# Patient Record
Sex: Male | Born: 1965 | Race: White | Hispanic: No | Smoking: Never smoker
Health system: Southern US, Community
[De-identification: ages and names within clinical notes are randomized; demographics above are authoritative.]

## PROBLEM LIST (undated history)

## (undated) DIAGNOSIS — I2510 Atherosclerotic heart disease of native coronary artery without angina pectoris: Secondary | ICD-10-CM

## (undated) DIAGNOSIS — G35 Multiple sclerosis: Secondary | ICD-10-CM

## (undated) DIAGNOSIS — F32A Depression, unspecified: Secondary | ICD-10-CM

## (undated) DIAGNOSIS — I10 Essential (primary) hypertension: Secondary | ICD-10-CM

## (undated) DIAGNOSIS — F329 Major depressive disorder, single episode, unspecified: Secondary | ICD-10-CM

## (undated) DIAGNOSIS — E785 Hyperlipidemia, unspecified: Secondary | ICD-10-CM

## (undated) DIAGNOSIS — M1990 Unspecified osteoarthritis, unspecified site: Secondary | ICD-10-CM

## (undated) DIAGNOSIS — IMO0002 Reserved for concepts with insufficient information to code with codable children: Secondary | ICD-10-CM

## (undated) DIAGNOSIS — K219 Gastro-esophageal reflux disease without esophagitis: Secondary | ICD-10-CM

## (undated) DIAGNOSIS — R7989 Other specified abnormal findings of blood chemistry: Secondary | ICD-10-CM

## (undated) DIAGNOSIS — G4700 Insomnia, unspecified: Secondary | ICD-10-CM

## (undated) DIAGNOSIS — F419 Anxiety disorder, unspecified: Secondary | ICD-10-CM

## (undated) DIAGNOSIS — T7840XA Allergy, unspecified, initial encounter: Secondary | ICD-10-CM

## (undated) DIAGNOSIS — G709 Myoneural disorder, unspecified: Secondary | ICD-10-CM

## (undated) DIAGNOSIS — R945 Abnormal results of liver function studies: Secondary | ICD-10-CM

## (undated) DIAGNOSIS — I219 Acute myocardial infarction, unspecified: Secondary | ICD-10-CM

## (undated) DIAGNOSIS — D689 Coagulation defect, unspecified: Secondary | ICD-10-CM

## (undated) HISTORY — DX: Anxiety disorder, unspecified: F41.9

## (undated) HISTORY — DX: Hyperlipidemia, unspecified: E78.5

## (undated) HISTORY — DX: Myoneural disorder, unspecified: G70.9

## (undated) HISTORY — DX: Allergy, unspecified, initial encounter: T78.40XA

## (undated) HISTORY — PX: OTHER SURGICAL HISTORY: SHX169

## (undated) HISTORY — PX: PYLOROPLASTY: SHX418

## (undated) HISTORY — DX: Insomnia, unspecified: G47.00

## (undated) HISTORY — DX: Essential (primary) hypertension: I10

## (undated) HISTORY — DX: Abnormal results of liver function studies: R94.5

## (undated) HISTORY — DX: Reserved for concepts with insufficient information to code with codable children: IMO0002

## (undated) HISTORY — DX: Multiple sclerosis: G35

## (undated) HISTORY — DX: Acute myocardial infarction, unspecified: I21.9

## (undated) HISTORY — DX: Coagulation defect, unspecified: D68.9

## (undated) HISTORY — DX: Other specified abnormal findings of blood chemistry: R79.89

## (undated) HISTORY — PX: COLONOSCOPY: SHX174

## (undated) HISTORY — DX: Unspecified osteoarthritis, unspecified site: M19.90

## (undated) HISTORY — DX: Depression, unspecified: F32.A

## (undated) HISTORY — DX: Gastro-esophageal reflux disease without esophagitis: K21.9

## (undated) HISTORY — DX: Major depressive disorder, single episode, unspecified: F32.9

---

## 1988-11-07 ENCOUNTER — Encounter: Payer: Self-pay | Admitting: Gastroenterology

## 1997-05-21 ENCOUNTER — Encounter: Payer: Self-pay | Admitting: Gastroenterology

## 1997-10-20 ENCOUNTER — Ambulatory Visit: Admission: RE | Admit: 1997-10-20 | Discharge: 1997-10-20 | Payer: Self-pay | Admitting: Specialist

## 1998-05-26 ENCOUNTER — Encounter: Payer: Self-pay | Admitting: Internal Medicine

## 1998-05-26 ENCOUNTER — Ambulatory Visit (HOSPITAL_COMMUNITY): Admission: RE | Admit: 1998-05-26 | Discharge: 1998-05-26 | Payer: Self-pay | Admitting: Nurse Practitioner

## 2001-01-13 ENCOUNTER — Ambulatory Visit (HOSPITAL_COMMUNITY): Admission: RE | Admit: 2001-01-13 | Discharge: 2001-01-13 | Payer: Self-pay | Admitting: Internal Medicine

## 2001-01-13 ENCOUNTER — Encounter: Payer: Self-pay | Admitting: Internal Medicine

## 2004-03-16 ENCOUNTER — Ambulatory Visit: Payer: Self-pay | Admitting: Internal Medicine

## 2004-06-01 ENCOUNTER — Ambulatory Visit: Payer: Self-pay | Admitting: Gastroenterology

## 2004-10-20 ENCOUNTER — Ambulatory Visit: Payer: Self-pay | Admitting: Internal Medicine

## 2004-10-23 ENCOUNTER — Ambulatory Visit (HOSPITAL_COMMUNITY): Admission: RE | Admit: 2004-10-23 | Discharge: 2004-10-23 | Payer: Self-pay | Admitting: Internal Medicine

## 2004-10-26 ENCOUNTER — Ambulatory Visit: Payer: Self-pay | Admitting: Internal Medicine

## 2004-12-21 ENCOUNTER — Ambulatory Visit: Payer: Self-pay | Admitting: Internal Medicine

## 2005-01-22 ENCOUNTER — Ambulatory Visit: Payer: Self-pay | Admitting: Gastroenterology

## 2005-02-02 ENCOUNTER — Ambulatory Visit: Payer: Self-pay | Admitting: Gastroenterology

## 2005-02-02 ENCOUNTER — Encounter (INDEPENDENT_AMBULATORY_CARE_PROVIDER_SITE_OTHER): Payer: Self-pay | Admitting: *Deleted

## 2005-03-27 ENCOUNTER — Ambulatory Visit: Payer: Self-pay | Admitting: Internal Medicine

## 2005-04-05 ENCOUNTER — Ambulatory Visit: Payer: Self-pay | Admitting: Gastroenterology

## 2005-12-19 ENCOUNTER — Ambulatory Visit: Payer: Self-pay | Admitting: Gastroenterology

## 2005-12-21 ENCOUNTER — Ambulatory Visit: Payer: Self-pay | Admitting: Cardiovascular Disease

## 2006-03-27 ENCOUNTER — Ambulatory Visit: Payer: Self-pay | Admitting: Internal Medicine

## 2006-10-03 ENCOUNTER — Emergency Department (HOSPITAL_COMMUNITY): Admission: EM | Admit: 2006-10-03 | Discharge: 2006-10-03 | Payer: Self-pay | Admitting: Emergency Medicine

## 2006-10-08 ENCOUNTER — Ambulatory Visit: Payer: Self-pay | Admitting: Internal Medicine

## 2006-10-10 ENCOUNTER — Ambulatory Visit: Payer: Self-pay | Admitting: Internal Medicine

## 2006-10-10 LAB — CONVERTED CEMR LAB
Cholesterol: 184 mg/dL (ref 0–200)
Total CHOL/HDL Ratio: 7.8

## 2006-11-19 ENCOUNTER — Ambulatory Visit: Payer: Self-pay | Admitting: Internal Medicine

## 2007-01-21 ENCOUNTER — Ambulatory Visit: Payer: Self-pay | Admitting: Gastroenterology

## 2007-01-29 ENCOUNTER — Encounter: Payer: Self-pay | Admitting: Gastroenterology

## 2007-01-29 ENCOUNTER — Ambulatory Visit: Payer: Self-pay | Admitting: Gastroenterology

## 2007-01-29 ENCOUNTER — Encounter: Payer: Self-pay | Admitting: Internal Medicine

## 2007-02-26 ENCOUNTER — Ambulatory Visit: Payer: Self-pay | Admitting: Internal Medicine

## 2007-02-26 DIAGNOSIS — J209 Acute bronchitis, unspecified: Secondary | ICD-10-CM | POA: Insufficient documentation

## 2007-04-14 ENCOUNTER — Encounter: Payer: Self-pay | Admitting: Internal Medicine

## 2007-04-14 DIAGNOSIS — I10 Essential (primary) hypertension: Secondary | ICD-10-CM | POA: Insufficient documentation

## 2007-04-14 DIAGNOSIS — K5120 Ulcerative (chronic) proctitis without complications: Secondary | ICD-10-CM | POA: Insufficient documentation

## 2007-04-14 DIAGNOSIS — K219 Gastro-esophageal reflux disease without esophagitis: Secondary | ICD-10-CM | POA: Insufficient documentation

## 2007-04-14 DIAGNOSIS — J309 Allergic rhinitis, unspecified: Secondary | ICD-10-CM | POA: Insufficient documentation

## 2007-04-14 DIAGNOSIS — G4700 Insomnia, unspecified: Secondary | ICD-10-CM | POA: Insufficient documentation

## 2007-10-28 ENCOUNTER — Telehealth: Payer: Self-pay | Admitting: Internal Medicine

## 2007-10-31 ENCOUNTER — Telehealth: Payer: Self-pay | Admitting: Internal Medicine

## 2007-11-03 ENCOUNTER — Ambulatory Visit: Payer: Self-pay | Admitting: Internal Medicine

## 2007-11-03 DIAGNOSIS — M7700 Medial epicondylitis, unspecified elbow: Secondary | ICD-10-CM | POA: Insufficient documentation

## 2007-11-03 DIAGNOSIS — M25559 Pain in unspecified hip: Secondary | ICD-10-CM | POA: Insufficient documentation

## 2007-12-18 ENCOUNTER — Telehealth: Payer: Self-pay | Admitting: Internal Medicine

## 2008-04-16 ENCOUNTER — Telehealth: Payer: Self-pay | Admitting: Gastroenterology

## 2008-04-22 ENCOUNTER — Telehealth: Payer: Self-pay | Admitting: Internal Medicine

## 2008-05-10 ENCOUNTER — Ambulatory Visit: Payer: Self-pay | Admitting: Gastroenterology

## 2008-05-17 ENCOUNTER — Telehealth: Payer: Self-pay | Admitting: Internal Medicine

## 2008-08-17 ENCOUNTER — Encounter: Payer: Self-pay | Admitting: Internal Medicine

## 2008-12-24 ENCOUNTER — Encounter (INDEPENDENT_AMBULATORY_CARE_PROVIDER_SITE_OTHER): Payer: Self-pay | Admitting: *Deleted

## 2009-01-07 ENCOUNTER — Ambulatory Visit: Payer: Self-pay | Admitting: Internal Medicine

## 2009-01-07 DIAGNOSIS — J4520 Mild intermittent asthma, uncomplicated: Secondary | ICD-10-CM | POA: Insufficient documentation

## 2009-01-07 DIAGNOSIS — R05 Cough: Secondary | ICD-10-CM

## 2009-01-07 DIAGNOSIS — R059 Cough, unspecified: Secondary | ICD-10-CM | POA: Insufficient documentation

## 2009-01-10 ENCOUNTER — Telehealth: Payer: Self-pay | Admitting: Internal Medicine

## 2009-02-01 ENCOUNTER — Emergency Department (HOSPITAL_COMMUNITY): Admission: EM | Admit: 2009-02-01 | Discharge: 2009-02-01 | Payer: Self-pay | Admitting: Emergency Medicine

## 2009-02-01 ENCOUNTER — Encounter (INDEPENDENT_AMBULATORY_CARE_PROVIDER_SITE_OTHER): Payer: Self-pay | Admitting: Emergency Medicine

## 2009-03-30 ENCOUNTER — Telehealth: Payer: Self-pay | Admitting: Gastroenterology

## 2009-03-31 ENCOUNTER — Ambulatory Visit: Payer: Self-pay | Admitting: Gastroenterology

## 2009-03-31 ENCOUNTER — Ambulatory Visit: Payer: Self-pay | Admitting: Internal Medicine

## 2009-03-31 DIAGNOSIS — K5150 Left sided colitis without complications: Secondary | ICD-10-CM | POA: Insufficient documentation

## 2009-03-31 DIAGNOSIS — R1032 Left lower quadrant pain: Secondary | ICD-10-CM | POA: Insufficient documentation

## 2009-03-31 DIAGNOSIS — K625 Hemorrhage of anus and rectum: Secondary | ICD-10-CM | POA: Insufficient documentation

## 2009-03-31 DIAGNOSIS — K51319 Ulcerative (chronic) rectosigmoiditis with unspecified complications: Secondary | ICD-10-CM | POA: Insufficient documentation

## 2009-03-31 DIAGNOSIS — R197 Diarrhea, unspecified: Secondary | ICD-10-CM | POA: Insufficient documentation

## 2009-03-31 DIAGNOSIS — R1031 Right lower quadrant pain: Secondary | ICD-10-CM | POA: Insufficient documentation

## 2009-04-01 ENCOUNTER — Encounter: Payer: Self-pay | Admitting: Physician Assistant

## 2009-04-07 ENCOUNTER — Telehealth (INDEPENDENT_AMBULATORY_CARE_PROVIDER_SITE_OTHER): Payer: Self-pay | Admitting: *Deleted

## 2009-04-07 LAB — CONVERTED CEMR LAB
Basophils Relative: 4.4 % — ABNORMAL HIGH (ref 0.0–3.0)
Eosinophils Absolute: 0 10*3/uL (ref 0.0–0.7)
HCT: 47.1 % (ref 39.0–52.0)
Lymphs Abs: 1.1 10*3/uL (ref 0.7–4.0)
MCHC: 32.8 g/dL (ref 30.0–36.0)
MCV: 96.9 fL (ref 78.0–100.0)
Monocytes Absolute: 0.7 10*3/uL (ref 0.1–1.0)
Neutro Abs: 3 10*3/uL (ref 1.4–7.7)
Neutrophils Relative %: 59 % (ref 43.0–77.0)
RBC: 4.86 M/uL (ref 4.22–5.81)

## 2009-05-03 ENCOUNTER — Ambulatory Visit: Payer: Self-pay | Admitting: Gastroenterology

## 2009-05-17 ENCOUNTER — Telehealth: Payer: Self-pay | Admitting: Gastroenterology

## 2009-05-18 ENCOUNTER — Ambulatory Visit: Payer: Self-pay | Admitting: Gastroenterology

## 2009-05-18 ENCOUNTER — Encounter: Payer: Self-pay | Admitting: Gastroenterology

## 2009-05-18 DIAGNOSIS — R1310 Dysphagia, unspecified: Secondary | ICD-10-CM | POA: Insufficient documentation

## 2009-05-18 DIAGNOSIS — R079 Chest pain, unspecified: Secondary | ICD-10-CM | POA: Insufficient documentation

## 2009-05-19 ENCOUNTER — Ambulatory Visit: Payer: Self-pay | Admitting: Gastroenterology

## 2009-05-20 ENCOUNTER — Encounter: Payer: Self-pay | Admitting: Gastroenterology

## 2009-05-25 ENCOUNTER — Ambulatory Visit: Payer: Self-pay | Admitting: Gastroenterology

## 2009-10-11 ENCOUNTER — Telehealth: Payer: Self-pay | Admitting: Internal Medicine

## 2009-10-21 ENCOUNTER — Telehealth: Payer: Self-pay | Admitting: Internal Medicine

## 2009-11-10 ENCOUNTER — Ambulatory Visit: Payer: Self-pay | Admitting: Internal Medicine

## 2009-11-10 LAB — CONVERTED CEMR LAB
BUN: 9 mg/dL (ref 6–23)
CO2: 30 meq/L (ref 19–32)
Chloride: 106 meq/L (ref 96–112)
Creatinine, Ser: 1 mg/dL (ref 0.4–1.5)
Folate: 11.7 ng/mL
Glucose, Bld: 108 mg/dL — ABNORMAL HIGH (ref 70–99)
Potassium: 4.3 meq/L (ref 3.5–5.1)

## 2009-11-11 ENCOUNTER — Telehealth (INDEPENDENT_AMBULATORY_CARE_PROVIDER_SITE_OTHER): Payer: Self-pay | Admitting: *Deleted

## 2009-11-12 ENCOUNTER — Telehealth: Payer: Self-pay | Admitting: Internal Medicine

## 2009-11-16 ENCOUNTER — Telehealth (INDEPENDENT_AMBULATORY_CARE_PROVIDER_SITE_OTHER): Payer: Self-pay | Admitting: *Deleted

## 2009-11-17 ENCOUNTER — Encounter (HOSPITAL_COMMUNITY): Admission: RE | Admit: 2009-11-17 | Discharge: 2009-12-06 | Payer: Self-pay | Admitting: Internal Medicine

## 2009-11-17 ENCOUNTER — Ambulatory Visit: Payer: Self-pay | Admitting: Cardiology

## 2009-11-17 ENCOUNTER — Encounter: Payer: Self-pay | Admitting: Cardiology

## 2009-11-17 ENCOUNTER — Ambulatory Visit: Payer: Self-pay

## 2009-12-20 ENCOUNTER — Ambulatory Visit: Payer: Self-pay | Admitting: Internal Medicine

## 2009-12-20 ENCOUNTER — Encounter (INDEPENDENT_AMBULATORY_CARE_PROVIDER_SITE_OTHER): Payer: Self-pay | Admitting: *Deleted

## 2009-12-20 LAB — CONVERTED CEMR LAB
Bilirubin Urine: NEGATIVE
Blood in Urine, dipstick: NEGATIVE
Glucose, Urine, Semiquant: NEGATIVE
Ketones, urine, test strip: NEGATIVE
Nitrite: NEGATIVE

## 2010-01-13 ENCOUNTER — Encounter (INDEPENDENT_AMBULATORY_CARE_PROVIDER_SITE_OTHER): Payer: Self-pay | Admitting: *Deleted

## 2010-01-17 ENCOUNTER — Ambulatory Visit: Payer: Self-pay | Admitting: Gastroenterology

## 2010-01-31 ENCOUNTER — Ambulatory Visit: Payer: Self-pay | Admitting: Gastroenterology

## 2010-02-02 ENCOUNTER — Encounter: Payer: Self-pay | Admitting: Gastroenterology

## 2010-02-17 ENCOUNTER — Ambulatory Visit: Payer: Self-pay | Admitting: Internal Medicine

## 2010-04-13 ENCOUNTER — Encounter: Payer: Self-pay | Admitting: Gastroenterology

## 2010-04-14 ENCOUNTER — Telehealth: Payer: Self-pay | Admitting: Gastroenterology

## 2010-04-20 NOTE — Progress Notes (Signed)
----   Converted from flag ---- ---- 04/01/2009 11:32 AM, Marisue Humble NCMA wrote: Call pt and get progress report. If he doesn't feel any better per Amy we may need to perscribe Prednisone. ------------------------------  Pt says he is feeling better than when he was here.  He has seen no blood.  He still has some minor chest pain problems and he sees Dr. Linda Hedges and is in contact with them. I advised if his chest pain worsens he needs to call Dr Linda Hedges or [place]. If the pain gets severe he should call 911.

## 2010-04-20 NOTE — Assessment & Plan Note (Signed)
Summary: lightheaded,dizzy x58mocd   Vital Signs:  Patient profile:   45year old male Height:      68 inches Weight:      175 pounds BMI:     26.70 O2 Sat:      98 % on Room air Temp:     98.0 degrees F oral Pulse rate:   71 / minute BP sitting:   140 / 90  (left arm) Cuff size:   regular  Vitals Entered By: ACharlynne CousinsCMA (November 10, 2009 1:27 PM)  O2 Flow:  Room air CC: pt here with c/o feeling light headed with dizziness and fatigue x about one month/ ab   Primary Care Provider:  MAdella Hare MD  CC:  pt here with c/o feeling light headed with dizziness and fatigue x about one month/ ab.  History of Present Illness: Patient presents for ill-described sense of dizzyness and dysequilibrium He has not had any focal symptoms: no diploblia, speech changes, cognitive impairment, focal weakness or paresthesia, falls, syncope.  A second complaint is intermittent chest pain/pressure which does occur with exercise but not reliably. He does have significant risk with male gender, very positive family history and hypertension.  Current Medications (verified): 1)  Asacol Hd 800 Mg Tbec (Mesalamine) .... Take 2 Tab 3 Times Daily 2)  Omeprazole 40 Mg Cpdr (Omeprazole) .... Take 1 Tab 30 Min Before Breakfast 3)  Creatine 750 Mg Caps (Nutritional Supplements) .... Take 4 Capsules By Mouth Once Daily 4)  Amino Acid  Caps (Amino Acids) .... Take 6 Capsules By Mouth Once Daily 5)  Hydrochlorothiazide 25 Mg Tabs (Hydrochlorothiazide) .... Take One By Mouth Once Daily 6)  Zolpidem Tartrate 10 Mg Tabs (Zolpidem Tartrate) ..Marland Kitchen. 1 Tab At Bedtime  Allergies (verified): 1)  ! Voltaren 2)  ! Skelaxin (Metaxalone)  Past History:  Past Medical History: Last updated: 05/03/2009 Allergic rhinitis GERD Hypertension Degenerative disk disease by MRI several years ago. Insomnia Ulcerative colitis-left sided Hx of abnormal liver function tests  Past Surgical History: Last updated:  05/18/2009 Inguinal herniorrhaphy, right and left.  Family History: Last updated: 05/18/2009 father - 1935: PTVDP, CAD/MI, HTN, Lipid, DM, Smoker mother - 1940: CAD.MI, lipid, smoker, HTN Brother - CAD/MI 454y/o - 4 stents; smoker; Neg - prostate or colon cancer Fx history of Hemachromatosis Family History of Colitis:Mother Family History of Diabetes: Father, Brother  Social History: Last updated: 05/18/2009 HSG married '87 -  1 son - '92; 1 daughter - '97 work: sLobbyist- shipping/rec'ing; qArchivist Patient has never smoked.  Alcohol Use - no Illicit Drug Use - no Patient gets regular exercise.  Risk Factors: Caffeine Use: 0 (11/03/2007) Exercise: yes (05/18/2009)  Risk Factors: Smoking Status: never (05/18/2009)  Review of Systems       The patient complains of chest pain and headaches.  The patient denies anorexia, fever, weight loss, weight gain, syncope, dyspnea on exertion, abdominal pain, severe indigestion/heartburn, suspicious skin lesions, difficulty walking, and depression.    Physical Exam  General:  WNWD atheletic appearing white male in no acute distress Head:  normocephalic, atraumatic, and no abnormalities observed.   Eyes:  vision grossly intact, pupils equal, pupils round, corneas and lenses clear, no retinal abnormalitiies, and no nystagmus.   Nose:  External nasal examination shows no deformity or inflammation. Nasal mucosa are pink and moist without lesions or exudates. Neck:  supple, no thyromegaly, and no carotid bruits.   Lungs:  normal respiratory effort and normal breath  sounds.   Heart:  normal rate, regular rhythm, no murmur, and no gallop.   Msk:  normal ROM.   Pulses:  2+ radial Neurologic:  alert & oriented X3, cranial nerves II-XII intact, strength normal in all extremities, sensation intact to light touch, sensation intact to pinprick, DTRs symmetrical and normal, finger-to-nose normal, heel-to-shin normal, and Romberg negative.   Nl rapid finger movement, no dysdiadochokinesia. Skin:  turgor normal and color normal.   Cervical Nodes:  no anterior cervical adenopathy and no posterior cervical adenopathy.   Psych:  Oriented X3, memory intact for recent and remote, normally interactive, and good eye contact.     Impression & Recommendations:  Problem # 1:  DYSEQUILIBRIUM (ICD-780.4) Non-focal neuro exam. Symptoms seems c/w labyrinthitis.  Plan - r/o metabolic disorder: W97, TSH, Bmet           trial of meclizine.           If symptoms do not respond to meclizine will move ahead with neuro-imaging: MRI  His updated medication list for this problem includes:    Meclizine Hcl 12.5 Mg Tabs (Meclizine hcl) .Marland Kitchen... 1 by mouth q 6 hrs.  Orders: TLB-B12 + Folate Pnl (82746_82607-B12/FOL) TLB-TSH (Thyroid Stimulating Hormone) (84443-TSH) TLB-BMP (Basic Metabolic Panel-BMET) (98921-JHERDEY) TLB-T4 (Thyrox), Free [phone])  Addendum - labs normal  Problem # 2:  CHEST PAIN (ICD-786.50) Patient with atypical chest pain but a very worrisome family history for CAD.  Plan - NST           advised to seek care at [facility] ED for worsening symptoms.  Orders: Cardiolite (Cardiolite)  Complete Medication List: 1)  Asacol Hd 800 Mg Tbec (Mesalamine) .... Take 2 tab 3 times daily 2)  Omeprazole 40 Mg Cpdr (Omeprazole) .... Take 1 tab 30 min before breakfast 3)  Creatine 750 Mg Caps (Nutritional supplements) .... Take 4 capsules by mouth once daily 4)  Amino Acid Caps (Amino acids) .... Take 6 capsules by mouth once daily 5)  Hydrochlorothiazide 25 Mg Tabs (Hydrochlorothiazide) .... Take one by mouth once daily 6)  Zolpidem Tartrate 10 Mg Tabs (Zolpidem tartrate) .Marland Kitchen.. 1 tab at bedtime 7)  Meclizine Hcl 12.5 Mg Tabs (Meclizine hcl) .Marland Kitchen.. 1 by mouth q 6 hrs.  Patient: John Kelly Note: All result statuses are Final unless otherwise noted.  Tests: (1) B12 + Folate Panel (B12/FOL)   Vitamin B12               461 pg/mL                    211-911   Folate                    11.7 ng/mL     Deficient  0.4 - 3.4 ng/mL     Indeterminate  3.4 - 5.4 ng/mL     Normal  >5.4 ng/mL  Tests: (2) TSH (TSH)   FastTSH                   2.07 uIU/mL                 0.35-5.50  Tests: (3) BMP (METABOL)   Sodium                    140 mEq/L                   135-145   Potassium  4.3 mEq/L                   3.5-5.1   Chloride                  106 mEq/L                   96-112   Carbon Dioxide            30 mEq/L                    19-32   Glucose              [H]  108 mg/dL                   70-99   BUN                       9 mg/dL                     6-23   Creatinine                1.0 mg/dL                   0.4-1.5   Calcium                   9.0 mg/dL                   8.4-10.5   GFR                       88.95 mL/min                >60  Tests: (4) T4, Free (FT4R)   Free T4                   0.75 ng/dL                  0.60-1.60  Patient Instructions: 1)  Poor balance: normal neurologic exam with no evidence of stroke or brain dysfunction. Suspect labyrinthitis (inner ear). Plan - trial of meclizine 12.5 mg every 6 hours as needed. May increase to 63m if needed. May make you drowsy. If you do not see improvement and if all lab work is normal will move ahead with an MRI brain. 2)  Decreased exercise tolerance with shortness of breath and chest discomfort - I am concerned about heart disease. Plan - take aspirin daily, will schedule a nuclear stress test. If you have serious increase in chest pressure or pain [facility] Prescriptions: MECLIZINE HCL 12.5 MG TABS (MECLIZINE HCL) 1 by mouth q 6 hrs.  #60 x 2   Entered and Authorized by:   MNeena RhymesMD   Signed by:   MNeena RhymesMD on 11/10/2009   Method used:   [facility]ronically to        [id number]       [address] [place]  [address]      Ph: [phone]       Fax: [phone]  RxID:   [id number]

## 2010-04-20 NOTE — Letter (Signed)
Summary: Moviprep Instructions  [facility]  [address]   [place], [place] [address]   Phone: [phone]  Fax: [phone]       Gopal Word    [birth year]    MRN: [id number]        Procedure Day Sudie Grumbling: Tuesday, 01-31-10     Arrival Time: 12:30 p.m.     Procedure Time: 1:30 p.m.     Location of Procedure:                    x   [place] (4th Floor)                        [place]   Starting 5 days prior to your procedure 01-26-10 do not eat nuts, seeds, popcorn, corn, beans, peas,  salads, or any raw vegetables.  Do not take any fiber supplements (e.g. Metamucil, Citrucel, and Benefiber).  THE DAY BEFORE YOUR PROCEDURE         DATE: 01-30-10   DAY: Monday  1.  Drink clear liquids the entire day-NO SOLID FOOD  2.  Do not drink anything colored red or purple.  Avoid juices with pulp.  No orange juice.  3.  Drink at least 64 oz. (8 glasses) of fluid/clear liquids during the day to prevent dehydration and help the prep work efficiently.  CLEAR LIQUIDS INCLUDE: Water Jello Ice Popsicles Tea (sugar ok, no milk/cream) Powdered fruit flavored drinks Coffee (sugar ok, no milk/cream) Gatorade Juice: apple, white grape, white cranberry  Lemonade Clear bullion, consomm, broth Carbonated beverages (any kind) Strained chicken noodle soup Hard Candy                             4.  In the morning, mix first dose of MoviPrep solution:    Empty 1 Pouch A and 1 Pouch B into the disposable container    Add lukewarm drinking water to the top line of the container. Mix to dissolve    Refrigerate (mixed solution should be used within 24 hrs)  5.  Begin drinking the prep at 5:00 p.m. The MoviPrep container is divided by 4 marks.   Every 15 minutes drink the solution down to the next mark (approximately 8 oz) until the full liter is complete.   6.  Follow completed prep with 16 oz of clear liquid of your choice  (Nothing red or purple).  Continue to drink clear liquids until bedtime.  7.  Before going to bed, mix second dose of MoviPrep solution:    Empty 1 Pouch A and 1 Pouch B into the disposable container    Add lukewarm drinking water to the top line of the container. Mix to dissolve    Refrigerate  THE DAY OF YOUR PROCEDURE      DATE: 01-31-10  DAY: Tuesday  Beginning at 8:30 a.m. (5 hours before procedure):         1. Every 15 minutes, drink the solution down to the next mark (approx 8 oz) until the full liter is complete.  2. Follow completed prep with 16 oz. of clear liquid of your choice.    3. You may drink clear liquids until 11:30 a.m.  (2 HOURS BEFORE PROCEDURE).   MEDICATION INSTRUCTIONS  Unless otherwise instructed, you should take regular prescription medications with a small sip of water   as early as possible  the morning of your procedure.       OTHER INSTRUCTIONS  You will need a responsible adult at least 46 years of age to accompany you and drive you home.   This person must remain in the waiting room during your procedure.  Wear loose fitting clothing that is easily removed.  Leave jewelry and other valuables at home.  However, you may wish to bring a book to read or  an iPod/MP3 player to listen to music as you wait for your procedure to start.  Remove all body piercing jewelry and leave at home.  Total time from sign-in until discharge is approximately 2-3 hours.  You should go home directly after your procedure and rest.  You can resume normal activities the  day after your procedure.  The day of your procedure you should not:   Drive   Make legal decisions   Operate machinery   Drink alcohol   Return to work  You will receive specific instructions about eating, activities and medications before you leave.    The above instructions have been reviewed and explained to me by   Emerson Monte RN  January 17, 2010 5:10 PM     I fully  understand and can verbalize these instructions _____________________________ Date _________

## 2010-04-20 NOTE — Progress Notes (Signed)
Summary: AMBIEN  Phone Note Call from Patient Call back at [phone]   Summary of Call: Pt was given rx for ambien 48m. He would like rx for 124m says 2 of the 33m79mabs helped pt w/sleep.  Initial call taken by: SarCharlsie QuestMACanaseragaAugust  5, 2011 4:39 PM    Additional Follow-up for Phone Call Additional follow up Details #2::    OK to change to zolpidem 18m333m bedtime #30, refill as needed  Follow-up by: MichNeena Rhymes  October 21, 2009 6:20 PM  New/Updated Medications: ZOLPIDEM TARTRATE 10 MG TABS (ZOLPIDEM TARTRATE) 1 tab at bedtime Prescriptions: ZOLPIDEM TARTRATE 10 MG TABS (ZOLPIDEM TARTRATE) 1 tab at bedtime  #30 x 1   Entered by:   Ami Bullins CMA   Authorized by:   MichNeena Rhymes  Signed by:   Ami Charlynne Cousins on 10/24/2009   Method used:   Telephoned to ...       [id number]       [address]   [place]  [id number]   Ph: [phone]   Fax: [phone]:   [id number]

## 2010-04-20 NOTE — Letter (Signed)
Summary: EGD Instructions  [facility]  [place], [place] [address]   Phone: [phone]  Fax: [phone]       CAMBREN HELM    1965/12/17    MRN: [id number]       Procedure Day /Date:05-19-09     Arrival Time:  7:30 AM      Procedure Time: 8:00 AM     Location of Procedure:                    X     [place] (4th Floor)  PREPARATION FOR ENDOSCOPY   On 05-19-09 THE DAY OF THE PROCEDURE:  1.   No solid foods, milk or milk products are allowed after midnight the night before your procedure.  2.   Do not drink anything colored red or purple.  Avoid juices with pulp.  No orange juice.  3.  You may drink clear liquids until 6:00 AM , which is 2 hours before your procedure.                                                                                                CLEAR LIQUIDS INCLUDE: Water Jello Ice Popsicles Tea (sugar ok, no milk/cream) Powdered fruit flavored drinks Coffee (sugar ok, no milk/cream) Gatorade Juice: apple, white grape, white cranberry  Lemonade Clear bullion, consomm, broth Carbonated beverages (any kind) Strained chicken noodle soup Hard Candy   MEDICATION INSTRUCTIONS  Unless otherwise instructed, you should take regular prescription medications with a small sip of water as early as possible the morning of your procedure.         OTHER INSTRUCTIONS  You will need a responsible adult at least 46 years of age to accompany you and drive you home.   This person must remain in the waiting room during your procedure.  Wear loose fitting clothing that is easily removed.  Leave jewelry and other valuables at home.  However, you may wish to bring a book to read or an iPod/MP3 player to listen to music as you wait for your procedure to start.  Remove all body piercing jewelry and leave at home.  Total time from sign-in until discharge is approximately 2-3 hours.  You should go home directly after your  procedure and rest.  You can resume normal activities the day after your procedure.  The day of your procedure you should not:   Drive   Make legal decisions   Operate machinery   Drink alcohol   Return to work  You will receive specific instructions about eating, activities and medications before you leave.    The above instructions have been reviewed and explained to me by   _______________________    I fully understand and can verbalize these instructions _____________________________ Date _________

## 2010-04-20 NOTE — Assessment & Plan Note (Signed)
Summary: 1 week follow up/sheri   History of Present Illness Visit Type: Follow-up Visit Primary GI MD: Joylene Igo MD [facility] Primary Provider: Adella Hare, MD Chief Complaint: Patient states that he still having chest pain when he lays down but he is doing good from GI stand point.  History of Present Illness:   John Kelly returns for followup of esophageal ulcers associated with dysphagia and odynophagia. Biopsies showed acute inflammation without evidence for viral esophagitis. His dysphasia and odynophagia have completely resolved.   GI Review of Systems    Reports chest pain.      Denies abdominal pain, acid reflux, belching, bloating, dysphagia with liquids, dysphagia with solids, heartburn, loss of appetite, nausea, vomiting, vomiting blood, weight loss, and  weight gain.        Denies anal fissure, black tarry stools, change in bowel habit, constipation, diarrhea, diverticulosis, fecal incontinence, heme positive stool, hemorrhoids, irritable bowel syndrome, jaundice, light color stool, liver problems, rectal bleeding, and  rectal pain.   Current Medications (verified): 1)  Asacol Hd 800 Mg Tbec (Mesalamine) .... Take 2 Tab 3 Times Daily 2)  Omeprazole 40 Mg Cpdr (Omeprazole) .... Take 1 Tab 30 Min Before Breakfast 3)  Creatine 750 Mg Caps (Nutritional Supplements) .... Take 4 Capsules By Mouth Once Daily 4)  Amino Acid  Caps (Amino Acids) .... Take 6 Capsules By Mouth Once Daily 5)  Hydrochlorothiazide 25 Mg Tabs (Hydrochlorothiazide) .... Take One By Mouth Once Daily  Allergies (verified): 1)  ! Voltaren 2)  ! Skelaxin (Metaxalone)  Past History:  Past Medical History: Last updated: 05/03/2009 Allergic rhinitis GERD Hypertension Degenerative disk disease by MRI several years ago. Insomnia Ulcerative colitis-left sided Hx of abnormal liver function tests  Past Surgical History: Last updated: 05/18/2009 Inguinal herniorrhaphy, right and left.  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  Alcohol Use - no Illicit Drug Use - no Patient gets regular exercise.  Review of Systems       The patient complains of fatigue.         The pertinent positives and negatives are noted as above and in the HPI. All other ROS were reviewed and were negative.   Vital Signs:  Patient profile:   45 year old male Height:      68 inches Weight:      173 pounds BMI:     26.40 Pulse rate:   76 / minute Pulse rhythm:   regular BP sitting:   140 / 78  (left arm) Cuff size:   regular  Vitals Entered By: Bernita Buffy CMA Deborra Medina) (May 25, 2009 8:48 AM)  Physical Exam  General:  Well developed, well nourished, no acute distress. Head:  Normocephalic and atraumatic. Eyes:  PERRLA, no icterus. Mouth:  No deformity or lesions, dentition normal. Lungs:  Clear throughout to auscultation. Heart:  Regular rate and rhythm; no murmurs, rubs,  or bruits. Abdomen:  Soft, nontender and nondistended. No masses, hepatosplenomegaly or hernias noted. Normal bowel sounds. Psych:  Alert and cooperative. Normal mood and affect.  Impression &  Recommendations:  Problem # 1:  DYSPHAGIA UNSPECIFIED (ICD-787.20) Assessment Improved Presumed self-limited esophageal ulcerations likely pill-induced.  Problem # 2:  ULCERATIVE COLITIS-LEFT SIDE (ICD-556.5) Continue Asacol 1.6 g t.i.d.  Problem # 3:  GERD (ICD-530.81) Continue omeprazole 40 mg daily.  Patient Instructions: 1)  Pick up your prescription from your  pharmacy.  2)  Please continue current medications.  3)  Please schedule a follow-up appointment in 1 year. 4)  Copy sent to : Adella Hare, MD 5)  The medication list was reviewed and reconciled.  All changed / newly prescribed medications were explained.  A complete medication list was provided to the patient / caregiver.  Prescriptions: OMEPRAZOLE 40 MG CPDR (OMEPRAZOLE) Take 1 tab 30 min before breakfast  #30 x 11   Entered by:   Marlon Pel CMA ([place])   Authorized by:   Ladene Artist MD [facility]   Signed by:   Ladene Artist MD FACG on 05/25/2009   Method used:   Electronically to        [id number] (retail)       [address] [place]       [place], [place]  [address]       Ph: [phone]       Fax: [phone]   RxID:   [id number]

## 2010-04-20 NOTE — Progress Notes (Signed)
Summary: Bad bleeding problems & hurting   Phone Note Call from Patient Call back at [phone]   Call For: Fuller Plan Summary of Call: Prescriptions are expired and is having bad bleeding problems & hurting.  Initial call taken by: Irwin Brakeman [facility],  March 30, 2009 10:13 AM  Follow-up for Phone Call        Patient  hasn't been taking his meds due to finances.  He is having abdominal pain and rectal bleeding for 3 weeks.  Patient  will come in tomorrow and see Amy Esterwood PA at 9:30, prior to restarting meds.  Patient  is agreeable to the plan. Follow-up by: Barb Merino RN, [place],  March 30, 2009 10:49 AM

## 2010-04-20 NOTE — Progress Notes (Signed)
Summary: Nuclear Pre-Procedure  Phone Note Outgoing Call   Call placed by: Perrin Maltese, EMT-P,  November 16, 2009 10:14 AM Summary of Call: Left message with information on [employer] Information Sheet (see scanned document for details).      Nuclear Med Background Indications for Stress Test: Evaluation for Ischemia   History: Echo   Symptoms: Chest Pain, Chest Pain with Exertion, Chest Pressure, Fatigue, Light-Headedness    Nuclear Pre-Procedure Cardiac Risk Factors: Family History - CAD Height (in): 85  Nuclear Med Study Referring MD:  M.Norins

## 2010-04-20 NOTE — Miscellaneous (Signed)
  Clinical Lists Changes  Medications: Added new medication of LIDOCAINE VISCOUS 2 % SOLN (LIDOCAINE HCL) Use by mouth q2h prn - Signed Rx of LIDOCAINE VISCOUS 2 % SOLN (LIDOCAINE HCL) Use by mouth q2h prn;  #1 x 0;  Signed;  Entered by: Ernestine Conrad RN;  Authorized by: Ladene Artist MD [facility];  Method used: Electronically to [facility], [place], [place], [place], [place]  [address], Ph: [phone], Fax: [phone]    Prescriptions: LIDOCAINE VISCOUS 2 % SOLN (LIDOCAINE HCL) Use by mouth q2h prn  #1 x 0   Entered by:   Ernestine Conrad RN   Authorized by:   Ladene Artist MD [facility]   Signed by:   Ernestine Conrad RN on 05/19/2009   Method used:   Electronically to        [facility]  [id number] [place] (retail)       [address], [place]       [place], [place]  [address]       Ph: [phone]       Fax: [phone]   RxID:   [id number]

## 2010-04-20 NOTE — Procedures (Signed)
Summary: [place] COLON   Colonoscopy  Procedure date:  01/29/2007  Findings:      Location:  [place].    Procedures Next Due Date:    Colonoscopy: 01/2009 Patient Name: John Kelly, John Kelly. MRN:  Procedure Procedures: Colonoscopy CPT: [phone].    with biopsy. CPT: [id number].  Personnel: Endoscopist: Pricilla Riffle. Fuller Plan, MD, Marval Regal.  Exam Location: Exam performed in Outpatient Clinic. Outpatient  Patient Consent: Procedure, Alternatives, Risks and Benefits discussed, consent obtained, from patient. Consent was obtained by the RN.  Indications  Surveillance of: Ulcerative Colitis.  History  Current Medications: Patient is not currently taking Coumadin.  Pre-Exam Physical: Performed Jan 29, 2007. Cardio-pulmonary exam, Rectal exam, HEENT exam , Abdominal exam, Mental status exam WNL.  Comments: Pt. history reviewed/updated, physical exam performed prior to initiation of sedation?Yes Exam Exam: Extent of exam reached: Cecum, extent intended: Cecum.  The cecum was identified by appendiceal orifice and IC valve. Time to Cecum: 00:01: 49. Time for Withdrawl: 00:07:55. Colon retroflexion performed. ASA Classification: II. Tolerance: excellent.  Monitoring: Pulse and BP monitoring, Oximetry used. Supplemental O2 given.  Colon Prep Used MoviPrep for colon prep. Prep results: excellent.  Sedation Meds: Patient assessed and found to be appropriate for moderate (conscious) sedation. Fentanyl 100 mcg. given IV. Versed 10 mg. given IV.  Findings NORMAL EXAM: Cecum to Descending Colon. Biopsy/Normal Exam taken. Comments: random biopsies taken.  - MUCOSAL ABNORMALITY: Sigmoid Colon to Rectum. Granularity present, Friability: spontaneous hemorrhage. Activity level moderate, Endoscopic Extent of Disease: Left-sided Colitis. Biopsy/Mucosal Abn. taken. ICD9: Colitis, Ulcerative, Left sided: 556.6.   Assessment  Diagnoses: 556.6: Colitis, Ulcerative, Left sided.    Events  Unplanned Interventions: No intervention was required.  Unplanned Events: There were no complications. Plans  Post Exam Instructions: Post sedation instructions given.  Medication Plan: Await pathology. 5-ASA: Mesalamine 1.6g TID, for indefinitely.  5-ASA: Mesalamine Suppository 1000 HS, for 4 wks.   Disposition: After procedure patient sent to recovery. After recovery patient sent home.  Scheduling/Referral: Colonoscopy, to [facility] T. Fuller Plan, MD, [facility], around Jan 28, 2009.  Office Visit, to [employer]. Fuller Plan, MD, [facility], around Feb 28, 2007.    This report was created from the original endoscopy report, which was reviewed and signed by the above listed endoscopist.

## 2010-04-20 NOTE — Medication Information (Signed)
Summary: Asacol/Cigna  Asacol/Cign[employer]   Imported By: Phillis Knack 04/13/2010 07:36:17  _____________________________________________________________________  External Attachment:    Type:   Image     Comment:   External Document

## 2010-04-20 NOTE — Assessment & Plan Note (Signed)
Summary: Cardiology Nuclear Testing  Nuclear Med Background Indications for Stress Test: Evaluation for Ischemia   History: Echo, Myocardial Perfusion Study  History Comments: MPS  Normal per patient  Symptoms: Chest Pain, Chest Pain with Exertion, Chest Pressure, DOE, Fatigue, Light-Headedness, Near Syncope  Symptoms Comments: Last episode of CP- yesterday   Nuclear Pre-Procedure Cardiac Risk Factors: Family History - CAD Caffeine/Decaff Intake: None NPO After: 8:00 PM Lungs: clear IV 0.9% NS with Angio Cath: 22g     IV Site: R Antecubital IV Started by: Irven Baltimore, RN Chest Size (in) 38     Height (in): 68 Weight (lb): 171 BMI: 26.09  Nuclear Med Study 1 or 2 day study:  1 day     Stress Test Type:  Stress Reading MD:  Kirk Ruths, MD     Referring MD:  Adella Hare, MD Resting Radionuclide:  Technetium 43mTetrofosmin     Resting Radionuclide Dose:  10.9 mCi  Stress Radionuclide:  Technetium 958metrofosmin     Stress Radionuclide Dose:  32.9 mCi   Stress Protocol Exercise Time (min):  13:01 min     Max HR:  151 bpm     Predicted Max HR:  17142pm  Max Systolic BP: 21395m Hg     Percent Max HR:  86.29 %     METS: 15.3 Rate Pressure Product:  3132023  Stress Test Technologist:  JaCrissie FiguresRN     Nuclear Technologist:  ToAnnye RuskCNMT  Rest Procedure  Myocardial perfusion imaging was performed at rest 45 minutes following the intravenous administration of Technetium 9947mtrofosmin.  Stress Procedure  The patient exercised for 13:01 minutes.  The patient stopped due to leg fatigue and c/o mild chest pain during exercise.  There were no significant ST-T wave changes.  Technetium 76m11mrofosmin was injected at peak exercise and myocardial perfusion imaging was performed after a brief delay.  QPS Raw Data Images:  Acquisition technically good; normal left ventricular size. Stress Images:  Normal homogeneous uptake in all areas of the myocardium. Rest  Images:  Normal homogeneous uptake in all areas of the myocardium. Subtraction (SDS):  No evidence of ischemia. Transient Ischemic Dilatation:  0.97  (Normal <1.22)  Lung/Heart Ratio:  0.29  (Normal <0.45)  Quantitative Gated Spect Images QGS EDV:  102 ml QGS ESV:  44 ml QGS EF:  57 % QGS cine images:  Normal wall motion.   Overall Impression  Exercise Capacity: Excellent exercise capacity. BP Response: Hypertensive blood pressure response. Clinical Symptoms: There is chest pain ECG Impression: No significant ST segment change suggestive of ischemia. Overall Impression: There is no sign of scar or ischemia.

## 2010-04-20 NOTE — Procedures (Signed)
Summary: Colonoscopy  Patient: John Kelly Note: All result statuses are Final unless otherwise noted.  Tests: (1) Colonoscopy (COL)   COL Colonoscopy           [facility].     [place], [place]  [id number]           COLONOSCOPY PROCEDURE REPORT     PATIENT:  John Kelly, John Kelly  MR#:  [id number]     BIRTHDATE:  [birth year], 44 yrs. old  GENDER:  male     ENDOSCOPIST:  Norberto Sorenson T. Fuller Plan, MD, [facility]           PROCEDURE DATE:  01/31/2010     PROCEDURE:  Colonoscopy with biopsy     ASA CLASS:  Class II     INDICATIONS:  1) surveillance and high-risk screening  2)     follow-up of chronic ulcerative colitis     MEDICATIONS:   Fentanyl 50 mcg IV, Versed 8 mg IV     DESCRIPTION OF PROCEDURE:   After the risks benefits and     alternatives of the procedure were thoroughly explained, informed     consent was obtained.  Digital rectal exam was performed and     revealed no abnormalities.   The LB PCF-H180AL [id number] endoscope     was introduced through the anus and advanced to the cecum, which     was identified by both the appendix and ileocecal valve, without     limitations.  The quality of the prep was excellent, using     MoviPrep.  The instrument was then slowly withdrawn as the colon     was fully examined.     <<PROCEDUREIMAGES>>     FINDINGS:  A normal appearing cecum, ileocecal valve, and     appendiceal orifice were identified. The ascending, hepatic     flexure, transverse, splenic flexure, descending, sigmoid colon     appeared unremarkable. Random biopsies were obtained and sent to     pathology.  Proctitis was identified. in the rectum. It was mild,     erythematous and friable. Multiple biopsies were obtained and sent     to pathology. Retroflexed views in the rectum revealed no other     findings other than those already described.  The time to cecum =     1.75  minutes. The scope was then withdrawn (time =  8.33  min)     from the patient and  the procedure completed.           COMPLICATIONS:  None           ENDOSCOPIC IMPRESSION:     1) Normal colon     2) Proctitis           RECOMMENDATIONS:     1) Await pathology results     2) Continue current medications     3) Repeat Colonoscopy in 2 years.           Pricilla Riffle. Fuller Plan, MD, Mar[facility] Regal           n.     eSIGNED:   Pricilla Riffle. Stark at 01/31/2010 01:34 PM           [name], [id number]  Note: An exclamation mark (!) indicates a result that was not dispersed into the flowsheet. Document Creation Date: 01/31/2010 1:35 PM _______________________________________________________________________  (1) Order result status: Final Collection or observation date-time: 01/31/2010 13:29  Requested date-time:  Receipt date-time:  Reported date-time:  Referring Physician:   Ordering Physician: Lucio Edward [phone]) Specimen Source:  Source: Tawanna Cooler Order Number: [id number] Lab site:   Appended Document: Colonoscopy     Procedures Next Due Date:    Colonoscopy: 01/2012

## 2010-04-20 NOTE — Assessment & Plan Note (Signed)
Summary: urinary irritation/cd   Vital Signs:  Patient profile:   45 year old male Height:      68 inches Weight:      175 pounds BMI:     26.70 O2 Sat:      97 % on Room air Temp:     97.6 degrees F oral Pulse rate:   62 / minute BP sitting:   148 / 90  (left arm)  Vitals Entered By: Charlynne Cousins CMA (December 20, 2009 4:21 PM)  O2 Flow:  Room air CC: pt c/o freq urination with burning/ ab   Primary Care Provider:  Adella Hare, MD  CC:  pt c/o freq urination with burning/ ab.  History of Present Illness: Patient presents with a several day h/o uretheral burning and pain in the groin. He denies perineal pain, low back pain different from his usual, uretheral discharge ( no risk taking behavior for more than 6 months). He does not endorse dysuria per se. Denies fever or chills, N/V.  He reports he is having a flare of his colitis. He has made an appointment with GI.  Current Medications (verified): 1)  Asacol Hd 800 Mg Tbec (Mesalamine) .... Take 2 Tab 3 Times Daily 2)  Omeprazole 40 Mg Cpdr (Omeprazole) .... Take 1 Tab 30 Min Before Breakfast 3)  Creatine 750 Mg Caps (Nutritional Supplements) .... Take 4 Capsules By Mouth Once Daily 4)  Amino Acid  Caps (Amino Acids) .... Take 6 Capsules By Mouth Once Daily 5)  Hydrochlorothiazide 25 Mg Tabs (Hydrochlorothiazide) .... Take One By Mouth Once Daily 6)  Zolpidem Tartrate 10 Mg Tabs (Zolpidem Tartrate) .Marland Kitchen.. 1 Tab At Bedtime 7)  Meclizine Hcl 12.5 Mg Tabs (Meclizine Hcl) .Marland Kitchen.. 1 By Mouth Q 6 Hrs.  Allergies (verified): 1)  ! Voltaren 2)  ! Skelaxin (Metaxalone)  Past History:  Past Medical History: Last updated: 05/03/2009 Allergic rhinitis GERD Hypertension Degenerative disk disease by MRI several years ago. Insomnia Ulcerative colitis-left sided Hx of abnormal liver function tests  Past Surgical History: Last updated: 05/18/2009 Inguinal herniorrhaphy, right and left. PSH reviewed for relevance, FH reviewed for  relevance  Review of Systems  The patient denies anorexia, fever, weight loss, peripheral edema, abdominal pain, hematuria, incontinence, suspicious skin lesions, difficulty walking, and enlarged lymph nodes.    Physical Exam  General:  Well-developed,well-nourished,in no acute distress; alert,appropriate and cooperative throughout examination Head:  normocephalic and atraumatic.   Eyes:  pupils equal and pupils round.  c&s clear. Neck:  supple.   Lungs:  normal respiratory effort and normal breath sounds.   Heart:  normal rate and regular rhythm.   Abdomen:  soft and normal bowel sounds.  tender to deep palpation bilateral lower quadrants. No CVAT or flank tenderness   Impression & Recommendations:  Problem # 1:  PROSTATITIS, ACUTE (ICD-601.0)  symptoms and history suggest acute prostatitis. U/A negative.  Plan - cipro 287m two times a day x 14 days with 1 refill           patient educated as to a sequestered infection and the potential for extended antibiotics.  Orders: UA Dipstick w/o Micro (manual) (81002)  Complete Medication List: 1)  Asacol Hd 800 Mg Tbec (Mesalamine) .... Take 2 tab 3 times daily 2)  Omeprazole 40 Mg Cpdr (Omeprazole) .... Take 1 tab 30 min before breakfast 3)  Creatine 750 Mg Caps (Nutritional supplements) .... Take 4 capsules by mouth once daily 4)  Amino Acid Caps (Amino acids) ..Marland KitchenMarland KitchenMarland Kitchen  Take 6 capsules by mouth once daily 5)  Hydrochlorothiazide 25 Mg Tabs (Hydrochlorothiazide) .... Take one by mouth once daily 6)  Zolpidem Tartrate 10 Mg Tabs (Zolpidem tartrate) .Marland Kitchen.. 1 tab at bedtime 7)  Meclizine Hcl 12.5 Mg Tabs (Meclizine hcl) .Marland Kitchen.. 1 by mouth q 6 hrs. 8)  Ciprofloxacin Hcl 250 Mg Tabs (Ciprofloxacin hcl) .Marland Kitchen.. 1 by mouth two times a day x 14 days for prostatitis Prescriptions: CIPROFLOXACIN HCL 250 MG TABS (CIPROFLOXACIN HCL) 1 by mouth two times a day x 14 days for prostatitis  #28 x 1   Entered and Authorized by:   Neena Rhymes MD   Signed  by:   Charlynne Cousins CMA on 12/21/2009   Method used:   Electronically to        [facility] (retail)       [place], [place], [place]  [address]       Ph: [phone]       Fax: [phone]   RxID:   [id number]     Laboratory Results   Urine Tests   Date/Time Reported: Ami Bullins CMA  December 20, 2009 4:27 PM   Routine Urinalysis   Color: straw Appearance: Hazy Glucose: negative   (Normal Range: Negative) Bilirubin: negative   (Normal Range: Negative) Ketone: negative   (Normal Range: Negative) Spec. Gravity: 1.020   (Normal Range: 1.003-1.035) Blood: negative   (Normal Range: Negative) Protein: negative   (Normal Range: Negative) Urobilinogen: 0.2   (Normal Range: 0-1) Nitrite: negative   (Normal Range: Negative) Leukocyte Esterace: negative   (Normal Range: Negative)

## 2010-04-20 NOTE — Progress Notes (Signed)
Summary: triage   Phone Note Call from Patient Call back at Home Phone [phone]   Caller: Patient Call For: STARK Reason for Call: Talk to Nurse Summary of Call: Patient has a lot of chest pains since Saturday, states that it's worse when he eats want's to be seen today. Initial call taken by: Ronalee Red,  May 17, 2009 9:48 AM  Follow-up for Phone Call        Patient  ws seen in the er and by Dr Linda Hedges for atypical chest pain thought to be GERD according to Dr Linda Hedges office note 04-18-09.  Patient  was instructed at the time to increase his omeprazole to 40 mg two times a day but is getting no relief.  Pain is midsternal describes as burning and sharp and worse with meals.  Patient will be scheduled to see Tye Savoy RNP for 05-18-09 8:30. Follow-up by: Barb Merino RN, CGRN,  May 17, 2009 10:04 AM

## 2010-04-20 NOTE — Procedures (Signed)
Summary: Upper Endoscopy  Patient: John Kelly Note: All result statuses are Final unless otherwise noted.  Tests: (1) Upper Endoscopy (EGD)   EGD Upper Endoscopy       [facility].     [place], [place]  [address]           ENDOSCOPY PROCEDURE REPORT           PATIENT:  John, Kelly  MR#:  [id number]     BIRTHDATE:  1965/12/15, 43 yrs. old  GENDER:  male           ENDOSCOPIST:  Norberto Sorenson T. Fuller Plan, MD, [facility]           PROCEDURE DATE:  05/19/2009     PROCEDURE:  EGD with biopsy     ASA CLASS:  Class II     INDICATIONS:  dysphagia, odynophagia, chest pain           MEDICATIONS:  Fentanyl 50 mcg IV, Versed 5 mg IV     TOPICAL ANESTHETIC:  Exactacain Spray           DESCRIPTION OF PROCEDURE:   After the risks benefits and     alternatives of the procedure were thoroughly explained, informed     consent was obtained.  The [facility] GIF-H180 [id number] endoscope was     introduced through the mouth and advanced to the second portion of     the duodenum, without limitations.  The instrument was slowly     withdrawn as the mucosa was fully examined.     <<PROCEDUREIMAGES>>           Multiple ulcers were found in the mid esophagus. They were likely     benign and measured 4-6 mm each. They were located between 26 - 32     cm. Multiple biopsies were obtained and sent to pathology.  R/O     infectious, pill induced. The stomach was entered and closely     examined. The pylorus, antrum, angularis, and lesser curvature     were well visualized, including a retroflexed view of the cardia     and fundus. The stomach wall was normally distensable. The scope     passed easily through the pylorus into the duodenum. The duodenal     bulb was normal in appearance, as was the postbulbar duodenum.     Otherwise the examination was normal. Retroflexed views revealed     no abnormalities. The scope was then withdrawn from the patient     and the procedure completed.       COMPLICATIONS:  None           ENDOSCOPIC IMPRESSION:     1) 26 - 32 cm ulcers, multiple in the mid esophagus           RECOMMENDATIONS:     1) Await pathology results     2) continue PPI qam     3) Viscous lidocaine po q2h prn, 10 day supply     4) Full liquid to soft diet     5) Office visit in one week           Malcolm T. Fuller Plan, MD, Marval Regal           CC:  Neena Rhymes, MD           n.     Lorrin MaisPricilla Riffle. Stark at 05/19/2009 08:23 AM  [place], [place], [phone]  Note: An exclamation mark (!) indicates a result that was not dispersed into the flowsheet. Document Creation Date: 05/19/2009 8:24 AM _______________________________________________________________________  (1) Order result status: Final Collection or observation date-time: 05/19/2009 08:17 Requested date-time:  Receipt date-time:  Reported date-time:  Referring Physician:   Ordering Physician: Lucio Edward [phone]) Specimen Source:  Source: Tawanna Cooler Order Number: [id number] Lab site:

## 2010-04-20 NOTE — Progress Notes (Signed)
Summary: Canasa Refill  Medications Added CANASA 1000 MG SUPP (MESALAMINE) one suppository into rectum at bedtime       Phone Note Call from Patient Call back at [phone]   Call For: Dr Fuller Plan Reason for Call: Refill Medication Summary of Call: Would like a refill of his Canasa sent to [facility] in [place] Initial call taken by: Irwin Brakeman [facility],  April 14, 2010 9:14 AM  Follow-up for Phone Call        Pt states since his colonoscopy in November he has some BRB rectal bleeding after each BM. Pt has used Canasa suppositories in the past and has helped. Pt is still taking Asacol 2 tablet by mouth three times a day. Is it ok I give him a refill? I scheduled him a follow up visit on 05-02-10.  Follow-up by: Marlon Pel CMA Deborra Medina),  April 14, 2010 10:42 AM  Additional Follow-up for Phone Call Additional follow up Details #1::        Yes. Refill Canasa and REV. Additional Follow-up by: Ladene Artist MD Marval Regal,  April 14, 2010 11:43 AM    Additional Follow-up for Phone Call Additional follow up Details #2::    Rx was sent to pts pharmacy and pt notified to keep appt in February. Follow-up by: Marlon Pel CMA Deborra Medina),  April 14, 2010 11:49 AM  New/Updated Medications: CANASA 1000 MG SUPP (MESALAMINE) one suppository into rectum at bedtime Prescriptions: CANASA 1000 MG SUPP (MESALAMINE) one suppository into rectum at bedtime  #30 x 0   Entered by:   Marlon Pel CMA ([place])   Authorized by:   Ladene Artist MD [facility]   Signed by:   Marlon Pel CMA ([place]) on 04/14/2010   Method used:   Electronically to        [id number] (retail)       [address] [place]       [place],   [address]       Ph: [phone]       Fax: [phone]   RxID:   [id number]

## 2010-04-20 NOTE — Miscellaneous (Signed)
Summary: [facility] Previsit/prep  Clinical Lists Changes  Medications: Added new medication of MOVIPREP 100 GM  SOLR (PEG-KCL-NACL-NASULF-NA ASC-C) As per prep instructions. - Signed Rx of MOVIPREP 100 GM  SOLR (PEG-KCL-NACL-NASULF-NA ASC-C) As per prep instructions.;  #1 x 0;  Signed;  Entered by: Emerson Monte RN;  Authorized by: Ladene Artist MD [facility];  Method used: Electronically to [facility] [place], [place], [place], [place], [place]  [address], Ph: [phone], Fax: [phone] Allergies: Changed allergy or adverse reaction from [facility] (METAXALONE) to SKELAXIN (METAXALONE)    Prescriptions: MOVIPREP 100 GM  SOLR (PEG-KCL-NACL-NASULF-NA ASC-C) As per prep instructions.  #1 x 0   Entered by:   Emerson Monte RN   Authorized by:   Ladene Artist MD [facility]   Signed by:   Emerson Monte RN on 01/17/2010   Method used:   Electronically to        [id number] (retail)       [address] [place]       [place], [place]  [address]       Ph: [phone]       Fax: [phone]   RxID:   [id number]

## 2010-04-20 NOTE — Letter (Signed)
Summary: Pre Visit Letter Revised  [facility]  [place], [place] [address]   Phone: [phone]  Fax: [phone]        12/20/2009 MRN: [id number] [place] [address] [place], [place]  [address]             Procedure Date:  01/31/2010   Welcome to the Gastroenterology Division at [facility].    You are scheduled to see a nurse for your pre-procedure visit on 01/17/2010 at 4:30PM on the 3rd floor at [facility], [place] [employer].  We ask that you try to arrive at our office 15 minutes prior to your appointment time to allow for check-in.  Please take a minute to review the attached form.  If you answer "Yes" to one or more of the questions on the first page, we ask that you call the person listed at your earliest opportunity.  If you answer "No" to all of the questions, please complete the rest of the form and bring it to your appointment.    Your nurse visit will consist of discussing your medical and surgical history, your immediate family medical history, and your medications.   If you are unable to list all of your medications on the form, please bring the medication bottles to your appointment and we will list them.  We will need to be aware of both prescribed and over the counter drugs.  We will need to know exact dosage information as well.    Please be prepared to read and sign documents such as consent forms, a financial agreement, and acknowledgement forms.  If necessary, and with your consent, a friend or relative is welcome to sit-in on the nurse visit with you.  Please bring your insurance card so that we may make a copy of it.  If your insurance requires a referral to see a specialist, please bring your referral form from your primary care physician.  No co-pay is required for this nurse visit.     If you cannot keep your appointment, please call [phone] to cancel or reschedule prior to your appointment date.  This  allows [place] the opportunity to schedule an appointment for another patient in need of care.    Thank you for choosing [facility] for your medical needs.  We appreciate the opportunity to care for you.  Please visit [place] at our website  to learn more about our practice.  Sincerely, The Gastroenterology Division

## 2010-04-20 NOTE — Progress Notes (Signed)
----   Converted from flag ---- ---- 11/11/2009 11:53 AM, Lorraine Lax wrote: appt 9/1 @ 8:00  ---- 11/11/2009 11:25 AM, Ophelia Charter wrote: Thanks  ---- 11/10/2009 2:02 PM, Neena Rhymes MD wrote: The following orders have been entered for this patient and placed on Admin Hold:  Type:     Referral       Code:   Cardiolite Description:   Cardiolite Order Date:   11/10/2009   Authorized By:   Neena Rhymes MD Order #:   [id number] Clinical Notes:   Special Instructions: Patient with decreased exercise tolerance with shortness of breath and chest discomfort. Risk factors: family history with brother having MI @ 72, sister with MI, Mother with CAD/MI, father with CAD with AICD; HTN ------------------------------

## 2010-04-20 NOTE — Letter (Signed)
Summary: Patient Notice- Colon Biospy Results  [facility]  [address] [place], [place] [address]   Phone: [phone]  Fax: [phone]        February 02, 2010 MRN: [id number]    [facility] [address] [place], [place]  [address]    Dear John Kelly,  I am pleased to inform you that the biopsies taken during your recent colonoscopy did not show any evidence of cancer upon pathologic examination. The rectal biopsies showed active colitis.  Continue with the treatment plan as outlined on the day of your      exam.  You should have a repeat colonoscopy examination for this problem           in 2 years.  Please call us if you are having persistent problems or have questions about your condition that have not been fully answered at this time.  Sincerely,  Ladene Artist MD [facility]  This letter has been electronically signed by your physician.  Appended Document: Patient Notice- Colon Biospy Results Letter mailed

## 2010-04-20 NOTE — Assessment & Plan Note (Signed)
Summary: chest pain/John Kelly    History of Present Illness Visit Type: Follow-up Visit Primary GI MD: Joylene Igo MD [facility] Primary Provider: Adella Hare, MD Chief Complaint: Patient c/o several days midsternal chest pain/pressure. He states that he feels like he is "eating glass" when he swallows. He also c/o chest burning but denies any nausea, vomiting, dysphagia or other GI symptoms. History of Present Illness:   Patient followed by Dr. Fuller Plan for ulcerative colitis. Here for acute odynophagia, started within last four days and getting progressively worse. Initially felt like something stuck in his esophagus but that feeling has passed.  Now having pressure and burning in mid chest exacerbated by eating. Feels like he is "eating glass" when he swallows food or even jello.  No SOB. Pain not exacerbated by deep inspiration. No recent antibiotics. On chronic PPI for GERD. No significant reflux symptoms.   GI Review of Systems    Reports chest pain.     Location of  Abdominal pain: none.    Denies abdominal pain, acid reflux, belching, bloating, dysphagia with liquids, dysphagia with solids, heartburn, loss of appetite, nausea, vomiting, vomiting blood, weight loss, and  weight gain.      Reports rectal bleeding.     Denies anal fissure, black tarry stools, change in bowel habit, constipation, diarrhea, diverticulosis, fecal incontinence, heme positive stool, hemorrhoids, irritable bowel syndrome, jaundice, light color stool, liver problems, and  rectal pain. Preventive Screening-Counseling & Management  Alcohol-Tobacco     Smoking Status: never  Caffeine-Diet-Exercise     Does Patient Exercise: yes      Drug Use:  no.      Current Medications (verified): 1)  Asacol Hd 800 Mg Tbec (Mesalamine) .... Take 2 Tab 3 Times Daily 2)  Omeprazole 40 Mg Cpdr (Omeprazole) .... Take 1 Tab 30 Min Before Breakfast 3)  Bentyl 10 Mg Caps (Dicyclomine Hcl) .... Take 1 Tab Before Meals As Eneded  For Cramping 4)  Creatine 750 Mg Caps (Nutritional Supplements) .... Take 4 Capsules By Mouth Once Daily 5)  Amino Acid  Caps (Amino Acids) .... Take 6 Capsules By Mouth Once Daily  Allergies (verified): 1)  ! Voltaren 2)  ! Skelaxin (Metaxalone)  Past History:  Past Medical History: Reviewed history from 05/03/2009 and no changes required. Allergic rhinitis GERD Hypertension Degenerative disk disease by MRI several years ago. Insomnia Ulcerative colitis-left sided Hx of abnormal liver function tests  Past Surgical History: Inguinal herniorrhaphy, right and left.  Family History: father - 22: PTVDP, CAD/MI, HTN, Lipid, DM, Smoker mother - 1940: CAD.MI, lipid, smoker, HTN Brother - CAD/MI 48 y/o - 4 stents; smoker; Neg - prostate or colon cancer Fx history of Hemachromatosis Family History of Colitis:Mother Family History of Diabetes: Father, Brother  Social History: HSG married '87 -  1 son - '92; 1 daughter - '97 work: Lobbyist - shipping/rec'ing; Archivist. Patient has never smoked.  Alcohol Use - no Illicit Drug Use - no Patient gets regular exercise. Drug Use:  no  Review of Systems       The patient complains of fatigue and sleeping problems.  The patient denies allergy/sinus, anemia, anxiety-new, arthritis/joint pain, back pain, blood in urine, breast changes/lumps, change in vision, confusion, cough, coughing up blood, depression-new, fainting, fever, headaches-new, hearing problems, heart murmur, heart rhythm changes, itching, menstrual pain, muscle pains/cramps, night sweats, nosebleeds, pregnancy symptoms, shortness of breath, skin rash, sore throat, swelling of feet/legs, swollen lymph glands, thirst - excessive , urination -  excessive , urination changes/pain, urine leakage, vision changes, and voice change.    Vital Signs:  Patient profile:   45 year old male Height:      68 inches Weight:      173 pounds BMI:     26.40 BSA:     1.92 Pulse  rate:   64 / minute Pulse rhythm:   regular BP sitting:   140 / 82  (right arm)  Vitals Entered By: [place] Deborra Medina) (May 18, 2009 8:56 AM)  Physical Exam  General:  Well developed, well nourished, no acute distress. Mouth:  No oral lesions. No exudate. Tongue moist.  Chest Wall:  Nontender Lungs:  Clear throughout to auscultation. Abdomen:  Abdomen soft, nontender, nondistended. No obvious masses or hepatomegaly.Normal bowel sounds.  Neurologic:  Alert and  oriented x4;  grossly normal neurologically.   Impression & Recommendations:  Problem # 1:  DYSPHAGIA UNSPECIFIED (ICD-787.20) Assessment New Four day history of chest pain / severe odynophagia. Pain constant 3/10 but 9/10 when attempting to eat.  Rule out pill esophagitis, viral / fungal esophagitis. The patient will be scheduled for an EGD with biopsies/ esophageal dilation ( if indicated) first thing in the morning with Dr. Fuller Plan.  The risks and benefits of the procedure, as well as alternatives were discussed with the patient and he agrees to proceed. In the meantime will call in Magic Mouthwash to take four times a day.  Orders: EGD (EGD)  Problem # 2:  GERD (ICD-530.81) Assessment: Comment Only Asymptomatic on Omeprazaole.   Patient Instructions: 1)  We sent a perscription for Magic Mouthwash to [place], [place]. 2)  We scheduled the Endoscopy with Dr. Fuller Plan for tomorrow 05-19-09. 3)  Directions provided and location is our Endoscopy center on the 4th floor of our building. 4)  [place] Patient Information Guide given to patient. 5)  Adella Hare, MD 6)  The medication list was reviewed and reconciled.  All changed / newly prescribed medications were explained.  A complete medication list was provided to the patient / caregiver. Prescriptions: MAGIC Fabrica Take 5 cc and swish and spit  3 times daily x 7 days  #105 cc x 0   Entered by:   Marisue Humble NCMA   Authorized by:    Tye Savoy NP   Signed by:   Marisue Humble NCMA on 05/18/2009   Method used:   Telephoned to ...       [id number] (retail)       [place], [place]       [place], [place]  [address]       Ph: [phone]       Fax: [phone]   RxID:   [id number]

## 2010-04-20 NOTE — Assessment & Plan Note (Signed)
Summary: uc colitis flare/sheri    History of Present Illness Visit Type: Follow-up Visit Primary GI MD: Joylene Igo MD [facility] Primary Provider: Adella Hare, MD Chief Complaint: ulcerative colitis flare x 1 month History of Present Illness:   45 YO MALE KNOWN TO DR Fuller Plan WITH HX OF LEFT SIDED ULCERATIVE COLITIS AND GERD. HE WAS LAST SEEN 2/10, LAST COLONOSCOPY11/08 SHOWED ACTIVE LEFT SIDED COLITIS. HE HAS BEEN WELL MAINTAINED ON ASACOL 400MG/12 PERDAY. HE COMES IN TODAY WITH COLITIS FLARING UP. HE HAS BEEN OUT OF MEDS FOR SEVERAL MONTHS-SAYS VERY EXPENSIVE. HE IS HAVING ABDOMINAL CRAMPING,AND BLEEDING WITH EACH BM. CURRENTLY HAVING 10+BMS/DAY,LOOSE. NO FEVER.CHILLS. APPETITE OK,WEIGHT DOWN A FEW POUNDS. HE DID TAKE A COURSE OF AVELOX IN DECEMBER-FEELS HIS SXS WERE FLARED PRIOR TO THAT. HE REPORTS MILD REFLUX SXS, NO DYSPHAGIA. OMEPRAZOLE 40 MG HAS WORKED WELL IN THE PAST.   GI Review of Systems    Reports abdominal pain and  nausea.     Location of  Abdominal pain: upper abdomen.    Denies acid reflux, belching, bloating, chest pain, dysphagia with liquids, dysphagia with solids, heartburn, loss of appetite, vomiting, vomiting blood, weight loss, and  weight gain.      Reports change in bowel habits, diarrhea, rectal bleeding, and  rectal pain.     Denies anal fissure, black tarry stools, constipation, diverticulosis, fecal incontinence, heme positive stool, hemorrhoids, irritable bowel syndrome, jaundice, light color stool, and  liver problems.    Current Medications (verified): 1)  Ester-C  Cr-Tabs (Bioflavonoid Products) .... Take 1 Tablet By Mouth Once A Day 2)  Whey Protein  Powd (Protein) .... Daily  Allergies (verified): 1)  ! Voltaren 2)  ! * Skelexin  Past History:  Past Medical History: Allergic rhinitis GERD Hypertension Degenerative disk disease by MRI several years ago. Insomnia Ulcerative colitis Hx of abnormal liver function tests  Past Surgical  History: Reviewed history from 05/10/2008 and no changes required. Inguinal herniorrhaphy, right and left.  Family History: Reviewed history from 05/07/2008 and no changes required. father - 70: PTVDP, CAD/MI, HTN, Lipid, DM, Smoker mother - 1940: CAD.MI, lipid, smoker, HTN Brother - CAD/MI 29 y/o - 4 stents; smoker; Neg - prostate or colon cancer Fx history of Hemachromatosis  Social History: Reviewed history from 11/03/2007 and no changes required. HSG married '87 -  1 son - '92; 1 daughter - '97 work: Lobbyist - shipping/rec'ing; Archivist.  Review of Systems       The patient complains of anxiety-new, back pain, change in vision, depression-new, fatigue, fever, headaches-new, sleeping problems, and vision changes.  The patient denies allergy/sinus, anemia, arthritis/joint pain, blood in urine, breast changes/lumps, confusion, cough, coughing up blood, fainting, hearing problems, heart murmur, heart rhythm changes, itching, menstrual pain, muscle pains/cramps, night sweats, nosebleeds, pregnancy symptoms, shortness of breath, skin rash, sore throat, swelling of feet/legs, swollen lymph glands, thirst - excessive , urination - excessive , urination changes/pain, urine leakage, and voice change.         ros otherwise as in hpi  Vital Signs:  Patient profile:   45 year old male Height:      68 inches Weight:      172 pounds BMI:     26.25 Pulse rate:   104 / minute Pulse rhythm:   regular BP sitting:   130 / 90  (left arm) Cuff size:   regular  Vitals Entered By: June McMurray [place] Deborra Medina) (March 31, 2009 9:46 AM)  Physical Exam  General:  Well developed, well nourished, no acute distress. Head:  Normocephalic and atraumatic. Eyes:  PERRLA, no icterus. Lungs:  Clear throughout to auscultation. Heart:  Regular rate and rhythm; no murmurs, rubs,  or bruits. Abdomen:  SOFT, MILD TENDERNESS LLQ, NO GUARDING OR REBOUND,BS+ Rectal:  NOT DONE Extremities:  No  clubbing, cyanosis, edema or deformities noted. Neurologic:  Alert and  oriented x4;  grossly normal neurologically. Psych:  Alert and cooperative. Normal mood and affect.   Impression & Recommendations:  Problem # 1:  ULCERATIVE COLITIS-LEFT SIDE (ICD-556.5) Assessment Deteriorated 45 YO WITH LEFT SIDED COLITIS WITH EXACERBATION X 2-3 MONTHS, NON COMPLIANT WITH MAINTANENCE MEDS. PT WITH HX ANTIBIOTIC USE . SUSPECT COLITIS EXACERBATION,R/O SUPERIMPOSED C. DIFF.  CBC TODAY STOOL FOR C.DIFF RESTART ASACOL- SWITCH TO HD 800 MG ;2 TABS ,3 X DAILY BENTYL 10 MG 3 X DAILY BEFORE MEALS FOLLOW UP WITH DR Fuller Plan IN 3 WEEKS,ADVISED PT THAT IF SXS WORSEN OR FAIL TO IMPROVE AFTER 7-10 DAYS HE WILL LIKELY NEED STEROIDS AND SHOULD CALL us BACK  IN THAT INTERIM  Problem # 2:  GERD (ICD-530.81) Assessment: Unchanged RESUME OMEPRAZOLE 40 MG DAILY IN AM BEFORE BREAKFAST Orders: T-Culture, C-Diff Toxin A/B ([id number]) TLB-CBC Platelet - w/Differential ([id number])  Patient Instructions: 1)  Please go to the lab, basement level. 2)  We called and faxed [facility] [facility] for the Omeprazole and Asacol HD perscriptions. 3)  We sent the Bentyl perscription to [facility]. 4)  We made you a follow up appointment with Dr. Fuller Plan on 05-03-09 at 9:30 Am. 5)  Call us in you are no better in 1 week. 6)  Copy sent to : Adella Hare, MD 7)  The medication list was reviewed and reconciled.  All changed / newly prescribed medications were explained.  A complete medication list was provided to the patient / caregiver. Prescriptions: BENTYL 10 MG CAPS (DICYCLOMINE HCL) Take 1 tab before meals as eneded for cramping  #90 x 1   Entered by:   Marisue Humble NCMA   Authorized by:   Alfredia Ferguson PA-c   Signed by:   Marisue Humble NCMA on 03/31/2009   Method used:   Electronically to        [facility] [place] (retail)       [place], [place]  [address]       Ph: [phone] or [phone]        Fax: [phone]   RxID:   [id number] ASACOL HD 800 MG TBEC (MESALAMINE) Take 2 tab 3 times daily  #180 x 3   Entered by:   Marisue Humble NCMA   Authorized by:   Alfredia Ferguson PA-c   Signed by:   Marisue Humble NCMA on 03/31/2009   Method used:   Printed then faxed to ...       [facility] (mail-order)       [place]  [address]       Ph: [phone]       Fax: [phone]   RxID:   [id number] OMEPRAZOLE 40 MG CPDR (OMEPRAZOLE) Take 1 tab 30 min before breakfast  #30 x 0   Entered by:   Marisue Humble NCMA   Authorized by:   Alfredia Ferguson PA-c   Signed by:   Marisue Humble NCMA on 03/31/2009   Method used:   Printed then faxed to [facility] (mail-[place])       [place]  [address]       Ph: [phone]       Fax: [phone]   RxID:   [id number] ASACOL HD 800 MG TBEC (MESALAMINE) Take 2 tab 3 times daily  #180 x 3   Entered by:   Marisue Humble NCMA   Authorized by:   Alfredia Ferguson PA-c   Signed by:   Marisue Humble NCMA on 03/31/2009   Method used:   Telephoned to ...       [id number] (retail)       [address] [place]       [place], [place]  [address]       Ph: [phone]       Fax: [phone]   RxID:   [id number]

## 2010-04-20 NOTE — Assessment & Plan Note (Signed)
Summary: 3-4 wks of chest pain when he lies flat, req ov 1/13/cd   Vital Signs:  Patient profile:   45 year old male Height:      68 inches Weight:      172 pounds BMI:     26.25 O2 Sat:      98 % on Room air Temp:     98.0 degrees F oral Pulse rate:   68 / minute BP sitting:   140 / 90  (left arm)  Vitals Entered By: Charlynne Cousins CMA (March 31, 2009 11:19 AM)  O2 Flow:  Room air CC: pt c/o sharp chest pain when laying down x about 2 months/ pt is due for tetanus shot/ ab   Primary Care Provider:  Adella Hare, MD  CC:  pt c/o sharp chest pain when laying down x about 2 months/ pt is due for tetanus shot/ ab.  History of Present Illness: Patinet presents for ED follow-up: he was seen november 16th for chest pain. Records reviewed: he had negative CE x 2, nl CXR, nl BMet, nl CBC, EKG without acute changes but downward sloping PR suggestive of pericarditis. He had a 2 D echo that was negative for pericardial effusion or thickening. Patient discharged home with diagnosis of non-cardiac chest pain.   He continues to have nocturnal pain on the left chest/breast that is acutally relieved when he roll to th right side.    Current Medications (verified): 1)  Ester-C  Cr-Tabs (Bioflavonoid Products) .... Take 1 Tablet By Mouth Once A Day 2)  Whey Protein  Powd (Protein) .... Daily 3)  Asacol Hd 800 Mg Tbec (Mesalamine) .... Take 2 Tab 3 Times Daily 4)  Omeprazole 40 Mg Cpdr (Omeprazole) .... Take 1 Tab 30 Min Before Breakfast 5)  Bentyl 10 Mg Caps (Dicyclomine Hcl) .... Take 1 Tab Before Meals As Eneded For Cramping  Allergies (verified): 1)  ! Voltaren 2)  ! * Skelexin  Past History:  Past Medical History: Last updated: 05/07/2008 Allergic rhinitis GERD Hypertension Degenerative disk disease by MRI several years ago. Insomnia Ulcerative proctitis Hx of abnormal liver function tests  Past Surgical History: Last updated: 05/10/2008 Inguinal herniorrhaphy, right and  left.  Family History: Last updated: 05/07/2008 father - 1935: PTVDP, CAD/MI, HTN, Lipid, DM, Smoker mother - 1940: CAD.MI, lipid, smoker, HTN Brother - CAD/MI 40 y/o - 4 stents; smoker; Neg - prostate or colon cancer Fx history of Hemachromatosis  Social History: Last updated: 11/03/2007 HSG married '87 -  1 son - '92; 1 daughter - '97 work: Lobbyist - shipping/rec'ing; Archivist.  Review of Systems  The patient denies vision loss, syncope, prolonged cough, headaches, muscle weakness, depression, and enlarged lymph nodes.   CV:  Complains of chest pain or discomfort; denies bluish discoloration of lips or nails, difficulty breathing at night, difficulty breathing while lying down, lightheadness, palpitations, and shortness of breath with exertion. GI:  Complains of abdominal pain, bloody stools, diarrhea, and indigestion; denies dark tarry stools, hemorrhoids, loss of appetite, nausea, and vomiting.  Physical Exam  General:  WNWD athletic appearing white male in No distress Head:  Normocephalic and atraumatic without obvious abnormalities. No apparent alopecia or balding. Eyes:  vision grossly intact, pupils equal, pupils round, corneas and lenses clear, and no injection.   Neck:  supple and full ROM.   Chest Wall:  no deformities.   Lungs:  normal respiratory effort, normal breath sounds, and no wheezes.   Heart:  normal rate,  regular rhythm, and no murmur.   Abdomen:  diffuse tenderness: lower quadrants c/w flare of IBD; epigastrum and LUQ Msk:  No deformity or scoliosis noted of thoracic or lumbar spine.   Neurologic:  alert & oriented X3 and cranial nerves II-XII intact.   Skin:  turgor normal, color normal, no rashes, and no ulcerations.   Psych:  Oriented X3, memory intact for recent and remote, and good eye contact.     Impression & Recommendations:  Problem # 1:  COLITIS, ULCERATIVE (ICD-556.9) Currently having a flare - followed by GI  Problem # 2:  GERD  (ICD-530.81) Chest pain work up in ED, including 2D echo, was normal. His symptoms are nocturnal, non-exertional and atypical in nature strongly suggestive of poorly controlled GERD.  Plan - elevate HOB on 4" blocks           increase omperazole to two times a day.  His updated medication list for this problem includes:    Omeprazole 40 Mg Cpdr (Omeprazole) .Marland Kitchen... Take 1 tab 30 min before breakfast    Bentyl 10 Mg Caps (Dicyclomine hcl) .Marland Kitchen... Take 1 tab before meals as eneded for cramping  Complete Medication List: 1)  Ester-c Cr-tabs (Bioflavonoid products) .... Take 1 tablet by mouth once a day 2)  Whey Protein Powd (Protein) .... Daily 3)  Asacol Hd 800 Mg Tbec (Mesalamine) .... Take 2 tab 3 times daily 4)  Omeprazole 40 Mg Cpdr (Omeprazole) .... Take 1 tab 30 min before breakfast 5)  Bentyl 10 Mg Caps (Dicyclomine hcl) .... Take 1 tab before meals as eneded for cramping

## 2010-04-20 NOTE — Letter (Signed)
Summary: Patient [facility] Biopsy Results  [facility]  [place], [place] [address]   Phone: [phone]  Fax: [phone]        May 20, 2009 MRN: [id number]    [facility] [address] [place], [place]  [address]    Dear Mr. Venditto,  I am pleased to inform you that the biopsies taken during your recent endoscopic examination did not show any evidence of cancer upon pathologic examination. The biopsies showed acute inflammation and ulceration with no evidence of infection.  Continue with the treatment plan as outlined on the day of your      exam. Plese keep your office appointment as scheduled.  Please call us if you are having persistent problems or have questions about your condition that have not been fully answered at this time.  Sincerely,  Ladene Artist MD [facility]  This letter has been electronically signed by your physician.  Appended Document: Patient Notice-Endo Biopsy Results Letter mailed 3.8.11

## 2010-04-20 NOTE — Progress Notes (Signed)
Summary: Sleep med  Phone Note Call from Patient Call back at [phone]   Summary of Call: Patient is requesting rx for ambien. He has used temazepam in the past but would like to try [place].  Initial call taken by: Charlsie Quest, [place],  October 11, 2009 12:08 PM  Follow-up for Phone Call        ok for zolpidem 32m 1 by mouth at bedtime as needed #15, may refill for #30 if it works for him.  Follow-up by: MNeena RhymesMD,  October 11, 2009 12:57 PM  Additional Follow-up for Phone Call Additional follow up Details #1::        Pt informed  Additional Follow-up by: SCharlsie Quest CMA,  October 11, 2009 4:41 PM    New/Updated Medications: AMBIEN 5 MG TABS (ZOLPIDEM TARTRATE) 1 at bedtime as needed Prescriptions: AMBIEN 5 MG TABS (ZOLPIDEM TARTRATE) 1 at bedtime as needed  #15 x 0   Entered by:   SCharlsie Quest CMA   Authorized by:   MNeena RhymesMD   Signed by:   SCharlsie Quest CMA on 10/11/2009   Method used:   Telephoned to ...       [id number] [place]       [address] [place]      [place]  [address]      Ph: [phone]      Fax: [phone]  RxID:   [id number]

## 2010-04-20 NOTE — Assessment & Plan Note (Signed)
Summary: F/U UC flare, saw PA   History of Present Illness Visit Type: Follow-up Visit Primary GI MD: Joylene Igo MD [facility] Primary Provider: Adella Hare, MD Chief Complaint: follow up UC flare, diarrhea is better, pt is still having bleeding History of Present Illness:   John Kelly  returns for followup of ulcerative colitis. His recent flare has come under good control on Asacol. He states he is taking his medications as prescribed. He relates 2-3 formed to semi-formed bowel movements a day. He is having minimal amounts of bleeding 2 or 3 days a week.   GI Review of Systems      Denies abdominal pain, acid reflux, belching, bloating, chest pain, dysphagia with liquids, dysphagia with solids, heartburn, loss of appetite, nausea, vomiting, vomiting blood, weight loss, and  weight gain.      Reports diarrhea and  rectal bleeding.     Denies anal fissure, black tarry stools, change in bowel habit, constipation, diverticulosis, fecal incontinence, heme positive stool, hemorrhoids, irritable bowel syndrome, jaundice, light color stool, liver problems, and  rectal pain.   Current Medications (verified): 1)  Asacol Hd 800 Mg Tbec (Mesalamine) .... Take 2 Tab 3 Times Daily 2)  Omeprazole 40 Mg Cpdr (Omeprazole) .... Take 1 Tab 30 Min Before Breakfast 3)  Bentyl 10 Mg Caps (Dicyclomine Hcl) .... Take 1 Tab Before Meals As Eneded For Cramping  Allergies: 1)  ! Voltaren 2)  ! Skelaxin (Metaxalone)  Past History:  Past Medical History: Allergic rhinitis GERD Hypertension Degenerative disk disease by MRI several years ago. Insomnia Ulcerative colitis-left sided Hx of abnormal liver function tests  Past Surgical History: Reviewed history from 05/10/2008 and no changes required. Inguinal herniorrhaphy, right and left.  Family History: Reviewed history from 05/07/2008 and no changes required. father - 85: PTVDP, CAD/MI, HTN, Lipid, DM, Smoker mother - 1940: CAD.MI, lipid,  smoker, HTN Brother - CAD/MI 91 y/o - 4 stents; smoker; Neg - prostate or colon cancer Fx history of Hemachromatosis  Social History: Reviewed history from 11/03/2007 and no changes required. HSG married '87 -  1 son - '92; 1 daughter - '97 work: Lobbyist - shipping/rec'ing; Archivist.  Review of Systems       The pertinent positives and negatives are noted as above and in the HPI. All other ROS were reviewed and were negative.   Vital Signs:  Patient profile:   45 year old male Height:      68 inches Weight:      175 pounds BMI:     26.70 Pulse rate:   68 / minute Pulse rhythm:   regular BP sitting:   140 / 90  (left arm) Cuff size:   regular  Vitals Entered By: Abelino Derrick CMA Deborra Medina) (May 03, 2009 9:49 AM)  Physical Exam  General:  Well developed, well nourished, no acute distress. Head:  Normocephalic and atraumatic. Eyes:  PERRLA, no icterus. Mouth:  No deformity or lesions, dentition normal. Lungs:  Clear throughout to auscultation. Heart:  Regular rate and rhythm; no murmurs, rubs,  or bruits. Abdomen:  Soft, nontender and nondistended. No masses, hepatosplenomegaly or hernias noted. Normal bowel sounds. Psych:  Alert and cooperative. Normal mood and affect.  Impression & Recommendations:  Problem # 1:  ULCERATIVE COLITIS-LEFT SIDE (ICD-556.5) Recent flare is resolving. Continue Asacol 4.8 g daily long term. If his diarrhea and rectal bleeding do not continue to improve, he will contact us, and consider the use of enemas. Routine followup in  one year.  Problem # 2:  GERD (ICD-530.81) Continue omeprazole 40 mg daily.  Patient Instructions: 1)  Pick up your prescription at your pharmacy.  2)  Please schedule a follow-up appointment in 1 year. 3)  The medication list was reviewed and reconciled.  All changed / newly prescribed medications were explained.  A complete medication list was provided to the patient /  caregiver.  Prescriptions: ASACOL HD 800 MG TBEC (MESALAMINE) Take 2 tab 3 times daily  #540 x 2   Entered by:   Marlon Pel CMA ([place])   Authorized by:   Ladene Artist MD [facility]   Signed by:   Marlon Pel CMA ([place]) on 05/03/2009   Method used:   Faxed to ...       [address] [facility] (mail-order)       [place]  [address]       Ph: [phone]       Fax: [phone]   RxID:   [id number]

## 2010-04-20 NOTE — Assessment & Plan Note (Signed)
Summary: INFECTED DOG BITE/NWS   Vital Signs:  Patient profile:   45 year old male Height:      68 inches Weight:      174 pounds BMI:     26.55 O2 Sat:      97 % on Room air Temp:     97.7 degrees F oral Pulse rate:   76 / minute BP sitting:   142 / 82  (left arm) Cuff size:   regular  Vitals Entered By: Charlynne Cousins CMA (February 17, 2010 4:24 PM)  O2 Flow:  Room air CC: evaluation of dog bite on left thumb/ ab   Primary Care Provider:  Adella Hare, MD  CC:  evaluation of dog bite on left thumb/ ab.  History of Present Illness: Patient presents after sustaining a dog bite to the left thumb several days ago. He has had swelling and redness and pain to the thumb. He is starting to feel some burning in the forearm but no swelling. He was bit by his own dog - up to date with rabies vaccination.   Current Medications (verified): 1)  Asacol Hd 800 Mg Tbec (Mesalamine) .... Take 2 Tab 3 Times Daily 2)  Omeprazole 40 Mg Cpdr (Omeprazole) .... Take 1 Tab 30 Min Before Breakfast 3)  Creatine 750 Mg Caps (Nutritional Supplements) .... Take 4 Capsules By Mouth Once Daily 4)  Amino Acid  Caps (Amino Acids) .... Take 6 Capsules By Mouth Once Daily 5)  Hydrochlorothiazide 25 Mg Tabs (Hydrochlorothiazide) .... Take One By Mouth Once Daily 6)  Zolpidem Tartrate 10 Mg Tabs (Zolpidem Tartrate) .Marland Kitchen.. 1 Tab At Bedtime 7)  Meclizine Hcl 12.5 Mg Tabs (Meclizine Hcl) .Marland Kitchen.. 1 By Mouth Q 6 Hrs.  Allergies (verified): 1)  ! Voltaren 2)  ! Skelaxin (Metaxalone) PMH-FH-SH reviewed-no changes except otherwise noted  Review of Systems  The patient denies fever, weight loss, dyspnea on exertion, headaches, abdominal pain, muscle weakness, abnormal bleeding, and enlarged lymph nodes.    Physical Exam  General:  Well-developed,well-nourished,in no acute distress; alert,appropriate and cooperative throughout examination Msk:  left thumb with mild swelling, definite erythema, tenderness and deceased  flexion.  Skin:  erythema of the left thumb Axillary Nodes:  no L axillary adenopathy.     Impression & Recommendations:  Problem # 1:  CELLULITIS, HAND, LEFT (ICD-682.4) dog bite with shallow puncture wound now with cellulitis of the thumb  Plan - Augmentin 875 two times a day x 10           for increased swelling, red streaks up the arm or increased pain - ED evaluation for possible ortho consult  The following medications were removed from the medication list:    Ciprofloxacin Hcl 250 Mg Tabs (Ciprofloxacin hcl) .Marland Kitchen... 1 by mouth two times a day x 14 days for prostatitis His updated medication list for this problem includes:    Amoxicillin-pot Clavulanate 875-125 Mg Tabs (Amoxicillin-pot clavulanate) .Marland Kitchen... 1 by mouth two times a day x 10 days for dog bite to hand  Complete Medication List: 1)  Asacol Hd 800 Mg Tbec (Mesalamine) .... Take 2 tab 3 times daily 2)  Omeprazole 40 Mg Cpdr (Omeprazole) .... Take 1 tab 30 min before breakfast 3)  Creatine 750 Mg Caps (Nutritional supplements) .... Take 4 capsules by mouth once daily 4)  Amino Acid Caps (Amino acids) .... Take 6 capsules by mouth once daily 5)  Hydrochlorothiazide 25 Mg Tabs (Hydrochlorothiazide) .... Take one by mouth once daily  6)  Zolpidem Tartrate 10 Mg Tabs (Zolpidem tartrate) .Marland Kitchen.. 1 tab at bedtime 7)  Meclizine Hcl 12.5 Mg Tabs (Meclizine hcl) .Marland Kitchen.. 1 by mouth q 6 hrs. 8)  Amoxicillin-pot Clavulanate 875-125 Mg Tabs (Amoxicillin-pot clavulanate) .Marland Kitchen.. 1 by mouth two times a day x 10 days for dog bite to hand Prescriptions: AMOXICILLIN-POT CLAVULANATE 875-125 MG TABS (AMOXICILLIN-POT CLAVULANATE) 1 by mouth two times a day x 10 days for dog bite to hand  #20 x 0   Entered and Authorized by:   Neena Rhymes MD   Signed by:   Neena Rhymes MD on 02/17/2010   Method used:   Electronically to        [facility] (retail)       [place], [place]   [address]       Ph: [phone]       Fax: [phone]   RxID:   [id number]    Orders Added: 1)  Est. Patient Level III [37023]

## 2010-04-20 NOTE — Progress Notes (Signed)
  Phone Note Outgoing Call   Reason for Call: Discuss lab or test results Details for Reason: call pt on cell [phone] Summary of Call: Please call patient: all labs were normal. How is the dizziness - improved with meclizine??  Thanks Initial call taken by: Neena Rhymes MD,  November 12, 2009 11:46 AM  Follow-up for Phone Call        Pt states he just started taking meclizine today and so far the dizziness is still present. He left medication at work so he has not taken it consistently.  Follow-up by: Ami Bullins CMA,  November 14, 2009 10:00 AM  Additional Follow-up for Phone Call Additional follow up Details #1::        K. If he doesn't see improvement iwth meclizine he will need MRI brain. Will call again tomorrow to see if it helps taken on a regular basis. Additional Follow-up by: Neena Rhymes MD,  November 14, 2009 12:17 PM    Additional Follow-up for Phone Call Additional follow up Details #2::    spoke with pt and he states that the meclizine has helped a little but that he is still exper. the dizziness. What do you advise? Pt does have a stress test sch in the am Follow-up by: Ami Bullins CMA,  November 16, 2009 1:48 PM  Additional Follow-up for Phone Call Additional follow up Details #3:: Details for Additional Follow-up Action Taken: will do the stress test. give the meclizine more time. will consider MRI next week if dizziness symptoms are not improved.  Additional Follow-up by: Neena Rhymes MD,  November 17, 2009 7:58 AM   pt states dizziness much better since he has been taking the meclizine consistently. He is concerned because he has not heard back from his stress test. Please Advise Ami Bullins CMA  November 23, 2009 9:26 AM   Stress test was NORMAL.  informed pt/ Ami Bullins CMA  November 23, 2009 11:58 AM

## 2010-05-02 ENCOUNTER — Ambulatory Visit: Payer: Self-pay | Admitting: Gastroenterology

## 2010-05-29 ENCOUNTER — Telehealth: Payer: Self-pay | Admitting: Internal Medicine

## 2010-06-06 NOTE — Progress Notes (Signed)
  Phone Note Refill Request Message from:  Fax from Pharmacy on May 29, 2010 1:44 PM  Refills Requested: Medication #1:  ZOLPIDEM TARTRATE 10 MG TABS 1 tab at bedtime Please Advise refill  Initial call taken by: Ami Bullins CMA,  May 29, 2010 1:45 PM  Follow-up for Phone Call        ok to refill x 12 Follow-up by: Neena Rhymes MD,  May 29, 2010 5:27 PM    Prescriptions: ZOLPIDEM TARTRATE 10 MG TABS (ZOLPIDEM TARTRATE) 1 tab at bedtime  #30 x prn   Entered by:   Charlynne Cousins CMA   Authorized by:   Neena Rhymes MD   Signed by:   Charlynne Cousins CMA on 05/30/2010   Method used:   Telephoned to ...       [id number] (retail)       [address], [place]       [place], [place]  [address]       Ph: [phone]       Fax: [phone]   RxID:   [id number]

## 2010-06-21 LAB — POCT CARDIAC MARKERS
CKMB, poc: 1.3 ng/mL (ref 1.0–8.0)
Troponin i, poc: <0.05 ng/mL (ref 0.00–0.09)
Troponin i, poc: <0.05 ng/mL (ref 0.00–0.09)

## 2010-06-21 LAB — CBC
Hemoglobin: 15.7 g/dL (ref 13.0–17.0)
Platelets: 147 10*3/uL — ABNORMAL LOW (ref 150–400)
RDW: 13.3 % (ref 11.5–15.5)
WBC: 5.9 10*3/uL (ref 4.0–10.5)

## 2010-06-21 LAB — BASIC METABOLIC PANEL
Calcium: 8.4 mg/dL (ref 8.4–10.5)
GFR calc non Af Amer: >60 mL/min (ref >60)
Glucose, Bld: 105 mg/dL — ABNORMAL HIGH (ref 70–99)
Potassium: 3.5 mEq/L (ref 3.5–5.1)
Sodium: 134 mEq/L — ABNORMAL LOW (ref 135–145)

## 2010-06-21 LAB — DIFFERENTIAL
Basophils Absolute: 0 10*3/uL (ref 0.0–0.1)
Lymphocytes Relative: 28 % (ref 12–46)
Lymphs Abs: 1.6 10*3/uL (ref 0.7–4.0)
Monocytes Absolute: 0.5 10*3/uL (ref 0.1–1.0)
Neutro Abs: 3.7 10*3/uL (ref 1.7–7.7)

## 2010-08-01 NOTE — Assessment & Plan Note (Signed)
[facility]                           PRIMARY CARE OFFICE NOTE   SOCRATES, CAHOON                       MRN:          [id number]  DATE:10/08/2006                            DOB:          11/06/1965    ADDENDUM:  Additional problem that the patient presented with was  dysequilibrium.  I did discuss this with him at length.  His neurologic  examination was nonfocal.  Suspect this may represent a mild  labyrinthitis.  He is advised to use Bonine one half or one tablet every  six hours as needed.  If his symptoms are unrelieved or if his symptoms  become more severe, I would then consider imaging studies and further  neurologic evaluation.     Heinz Knuckles Norins, MD  Electronically Signed    MEN/MedQ  DD: 10/09/2006  DT: 10/09/2006  Job #: [id number]

## 2010-08-01 NOTE — Assessment & Plan Note (Signed)
[facility]                           PRIMARY CARE OFFICE NOTE   John Kelly, John Kelly                       MRN:          [id number]  DATE:10/08/2006                            DOB:          1965/07/06    John Kelly is a pleasant 45 year old gentleman who presents for a  followup evaluation and exam.  He was last seen in the office March 27, 2006 for sore throat and congestion.   CHIEF COMPLAINT:  1. Chest pain.  The patient had an episode of chest pain, which is a      problem that has been going on for some time.  He describes either      sharp, lancinating pains in his left chest of brief duration.  They      are not exertionally related.  The patient was seen at [facility]      emergency department for this on July 17.  He had a normal chest x-      ray at that time, normal EKG.  Cardiac enzymes were negative.  The      patient's cardiac risk factors include male gender and mild      hypertension, positive family history.  2. Dysequilibrium.  The patient had intermittent problems with being      off balance.  This can occur several times a day or last all day.      He has had no falls.  He has had no loss of sensation, focal      weakness, slurred speech, or other neurologic symptoms.  3. Arm pain.  The patient reports he will have episodes of significant      pain and discomfort that radiate down his entire left arm to his      hand, including numbness, tingling, weakness, and pain.  These      episodes will last 5 to 10 minutes.  They are unpredictable.  They      are not necessarily triggered by activity.  He reports that he was      diagnosed approximately 10 years ago with degenerative disk disease      by MRI.  That result is not available to me, but evidently, he did      not require any surgical intervention at that time.  4. Hypertension.  The patient's blood pressure has been elevated both      in our office and also when he has been  to the dentist and at other      times.  He is concerned that his blood pressure may be causing some      of his symptoms, including dysequilibrium and possibly headaches.  5. Memory loss.  The patient's wife reports the patient seems to have      significant problem with short-term memory.  6. Insomnia.  The patient evidently has significant chronic insomnia,      often times going 1 or 2 days without sleep.   PAST MEDICAL HISTORY:   SURGERIES:  None.   MEDICAL:  1. Usual childhood disease.  2. Allergies.  3. GERD.  4. Ulcerative proctitis.   FAMILY HISTORY:  Positive for hypertension, mother died at age 38 of an  MI.  There is a family history of hemochromatosis, and the patient has  been tested and screened, and is negative.   SOCIAL HISTORY:  The patient is married.  His wife is a Marine scientist and is  with him at today's visit.  The patient evidently has had several  losses, including the death of a brother-in-law, illness in both of his  parents.   REVIEW OF SYSTEMS:  The patient has had no fevers, sweats, or chills.  He has generalized weakness and decreased stamina.  He reports he has  had some blurred vision, which is intermittent, but no loss of vision.  The patient has had no ENT complaints.  The patient has had chest pain  and has been evaluated as above.  No respiratory problems.  The patient  has chronic abdominal discomfort that is relieved by Tums.  The patient  has no GU complaints at this time.  Musculoskeletal is significant for  ongoing knee pain.  No dermatologic or neurologic complaints.   EXAMINATION:  Temperature is 98.4, blood pressure 150/91, pulse 82,  weight 188.  GENERAL APPEARANCE:  A well-nourished, well-developed gentleman in no  acute distress.  HEENT:  Normocephalic, atraumatic.  EACs and TMs unremarkable.  Oropharynx with native dentition in good repair.  No buccal or palatal  lesions were noted.  Posterior pharynx was clear.  Conjunctivae,  sclerae  clear.  PERRLA.  EOMI.  Funduscopic exam revealed normal disk margins  with no vascular abnormalities.  NECK:  Supple without thyromegaly.  NODES:  No adenopathy was noted in the cervical or supraclavicular  regions, axillary regions, or inguinal regions.  CHEST:  No CVA tenderness.  No deformities.  LUNGS:  Clear with no rales, wheezes, or rhonchi.  CARDIOVASCULAR:  2+ radial pulse.  No JVP or carotid bruits.  He had a  quiet precordium with a regular rate and rhythm without murmurs, rubs,  or gallops.  ABDOMEN:  Soft.  There was no organo-splenomegaly.  There was minimal  tenderness in the epigastrium.  GU AND RECTAL:  Deferred.  EXTREMITIES:  Without cyanosis, clubbing, or edema.  No deformities were  noted.  NEUROLOGIC:  The patient is awake, alert, and oriented to person, place,  time, and context.  He was able to recall 3 words at 5 minutes.  He was  able to do serial 7s without difficulty.  He had slight difficulty  repeating numbers in reverse order, but was able to do 4 digits  successfully.  He was able to spell the word world backwards with 1  error.   DATABASE:  Chest x-ray from [facility] July 17 was unremarkable.  Lab  work at [facility] May 17 revealed an ABG with a pH of 7.34, PCO2 of  49.8, bicarbonate 26.9, hemoglobin 15.3 g, white count 5,900 with a  normal differential.  Chemistries were unremarkable with a serum glucose  of 92.  Cardiac enzymes were negative.   ASSESSMENT AND PLAN:  1. Cardiovascular.  The patient with a negative evaluation at [facility].  Furthermore, his symptoms are very atypical.  More      suggestive of a gastrointestinal origin with a known history of      gastroesophageal reflux disease.  2. Gastrointestinal.  The patient with a history of inflammatory bowel      disease/proctitis.  He  continues to take Asacol 1600 mg t.i.d.,      Canasa suppositories as needed.  3. Gastroesophageal reflux disease.  The patient  with a history of      gastroesophageal reflux disease in the past and followed by      gastroenterology service.  At this point, I think some of his chest      discomfort is in fact reflux.  Plan:  The patient is to use      ranitidine 150 mg b.i.d. as initial therapy following 15 days of      Zegerid.  If the patient continues to have problems, we need to      consider long-term PPI therapy.  4. Hypertension.  The patient's blood pressure is elevated at today's      visit and has been elevated in the past.  Plan:  Start      hydrochlorothiazide 25 mg daily with followup in 3 to 4 weeks.  If      poor control, would then consider adding either ACE inhibitor or      calcium channel blocker.  5. Arm pain.  Suspect the patient has degenerative disk disease with      intermittent nerve root compression.  The patient does not have      ongoing problems.  He did not have any significant disability from      his pain or discomfort and it is not constant.  At this point, I      would not repeat imaging studies unless his symptoms became more      profound.  Plan:  Start meloxicam 15 mg daily.  He is given      gastrointestinal precautions.  6. Insomnia.  The patient with significant problems with chronic      insomnia.  Sleep hygiene was reviewed with the patient in detail.      Furthermore, I have prescribed temazepam 30 mg to take nightly      every second or third day as needed.  7. Health maintenance.  The patient with an unremarkable examination      today.  He has had recent colonoscopy as of February 02, 2005.  The      patient does need to have a lipid panel and he will return at his      convenience for the same.   SUMMARY:  This is a pleasant gentleman with problems outlined above.  I  did do mental status checks, and he does seem to have adequate memory.  I would not pursue this further at this time.   The patient is asked to return to see me on a p.r.n. basis and certainly  if  there were issues that were discussed and undocumented, to notify me  after review of this dictation.     Heinz Knuckles Norins, MD  Electronically Signed    MEN/MedQ  DD: 10/09/2006  DT: 10/09/2006  Job #: [id number]   cc:   Fayne Mediate

## 2010-08-04 NOTE — Assessment & Plan Note (Signed)
John Kelly                           GASTROENTEROLOGY OFFICE NOTE   John Kelly                       MRN:          [id number]  DATE:12/19/2005                            DOB:          September 06, 1965    John Kelly has noted abdominal pain for the past two weeks.  He notes it in  the right upper quadrant and left lower quadrant.  He has noted some  problems with constipation over the past few weeks as well and took Dulcolax  for constipation last week with some improvement in symptoms.  At this  point, his left lower quadrant pain has been the most persistent problem.  He has noted some bloating as well.  He has run out of his blood pressure  medication and he has noted that his blood pressure is fluctuating and he  has felt fatigued for the past few weeks as well.  Today he had a small  amount of bright red blood per rectum with a bowel movement, but his  ulcerative proctitis symptoms  have generally been under very good control  on Asacol 1.6 g t.i.d. and Canasa suppositories nightly.  He does note he  forgot to take his Canasa suppositories for the past few nights.  He has no  fevers, chills, nausea, vomiting or weight loss.   MEDICATIONS:  1. Asacol 1.6 g t.i.d.  2. Canasa suppositories nightly.   ALLERGIES:  VOLTAREN and SKELAXIN.   PHYSICAL EXAMINATION:  GENERAL APPEARANCE:  No acute distress.  VITAL SIGNS:  Weight 188.6 pounds, blood pressure 128/66, pulse 68 and  regular.  HEENT:  Sclerae are anicteric.  Oropharynx clear.  CHEST:  Clear to auscultation bilaterally.  CARDIOVASCULAR:  Regular rate and rhythm without murmurs.  ABDOMEN:  Soft with minimal left lower quadrant tenderness to deep  palpation.  No rebound or guarding.  No palpable organomegaly, masses or  hernias.  Normal active bowel sounds.  There is a pea-sized subcutaneous  lesion in the left lower quadrant.   ASSESSMENT/PLAN:  1. New onset abdominal pain associated  with constipation.  Nonspecific pea-      sized subcutaneous abnormality in the left lower quadrant.  He is to      increase his fiber and fluid intake.  May use Milk of Magnesia p.r.n.      Obtain a CMET, CBC, amylase and lipase.  Schedule CT scan of the      abdomen and pelvis.  Return office visit in two weeks.  2. Ulcerative proctosigmoiditis.  Symptoms under good control.  Continue      Asacol and Canasa as currently prescribed.  3. Fatigue and intermittently elevated blood pressure.  Return office with      Dr. Linda Hedges within the next week.       Pricilla Riffle. Fuller Plan, MD, [facility]      MTS/MedQ  DD:  12/19/2005  DT:  12/20/2005  Job #:  [id number]   cc:   John Knuckles. Norins, MD

## 2010-09-06 ENCOUNTER — Ambulatory Visit (INDEPENDENT_AMBULATORY_CARE_PROVIDER_SITE_OTHER): Payer: Managed Care, Other (non HMO) | Admitting: Internal Medicine

## 2010-09-06 DIAGNOSIS — G4700 Insomnia, unspecified: Secondary | ICD-10-CM

## 2010-09-06 DIAGNOSIS — R079 Chest pain, unspecified: Secondary | ICD-10-CM

## 2010-09-07 MED ORDER — ZOLPIDEM TARTRATE 10 MG PO TABS: 10.0000 mg | ORAL_TABLET | Freq: Every evening | ORAL | Status: DC | PRN

## 2010-09-07 NOTE — Assessment & Plan Note (Signed)
At today's exam he has findings c/w intercostal muscle inflammation.   Plan - heat, lineament of choice, otc NSAIDS

## 2010-09-07 NOTE — Assessment & Plan Note (Signed)
Chronic recurrent problem. He reports 2 hours of sleep last 4 days.  Plan - zolpidem 75m qhs

## 2010-09-07 NOTE — Progress Notes (Signed)
  Subjective:    Patient ID: John Kelly, male    DOB: 01-Feb-1966, 45 y.o.   MRN: [id number]  HPI John Kelly presents with a complaint of fullness and pressure in the right ear. He has had no drainage, no tinnitis, no pain in the ear, no URI symptoms, no barotrauma. He has had no Fever/sweats/chills.  He reports he is not sleeping! Many factors keep him up - worry, work, etc. A primary problem is chest pain when he is supine. He hs had cardiac eval and GI eval with no etiology identified.  He has had good results with ambien in the past.     PMH, FamHx and SocHx reviewed for any changes and relevance.    Review of Systems Review of Systems  Constitutional:  Negative for fever, chills, activity change and unexpected weight change.  HEENT:  Negative for hearing loss, ear pain, congestion, neck stiffness and postnasal drip. Negative for sore throat or swallowing problems. Negative for dental complaints.   Eyes: Negative for vision loss or change in visual acuity.  Respiratory: Negative for chest tightness and wheezing.   Cardiovascular: Negative for chest pain and palpitationNo decreased exercise tolerance Gastrointestinal: No change in bowel habit. No bloating or gas. No reflux or indigestion Genitourinary: Negative for urgency, frequency, flank pain and difficulty urinating.  Musculoskeletal: Negative for myalgias, back pain, arthralgias and gait problem.  Neurological: Negative for dizziness, tremors, weakness and headaches.  Hematological: Negative for adenopathy.  Psychiatric/Behavioral: Negative for behavioral problems and dysphoric mood.       Objective:   Physical Exam Vitals noted Gen'l- WNWD white male in no distress HEENT- EACs clear. Right TM with poor movement with insuffulation, no frank redness or evidence of infection. Chest - tender to palpation in the intercostal spaces left chest. Cor RRR       Assessment & Plan:  EUSTACHIAN TUBE DYSFUNCTION - no evidence of  infection. Full explanation provided.  Plan - sudafed 4m bid or tid.

## 2011-01-01 LAB — POCT CARDIAC MARKERS
CKMB, poc: <1 — ABNORMAL LOW
Myoglobin, poc: 53.6
Operator id: 146091
Troponin i, poc: <0.05

## 2011-01-01 LAB — I-STAT 8, (EC8 V) (CONVERTED LAB)
BUN: 9
Bicarbonate: 26.9 — ABNORMAL HIGH
Chloride: 105
Glucose, Bld: 92
HCT: 45
Hemoglobin: 15.3
Operator id: 146091
Potassium: 4.2
Sodium: 139
TCO2: 28
pCO2, Ven: 49.8
pH, Ven: 7.341 — ABNORMAL HIGH

## 2011-01-01 LAB — DIFFERENTIAL
Basophils Absolute: 0
Basophils Relative: 0
Eosinophils Absolute: 0.1
Eosinophils Relative: 2
Lymphocytes Relative: 28
Lymphs Abs: 1.7
Monocytes Absolute: 0.4
Monocytes Relative: 7
Neutro Abs: 3.7
Neutrophils Relative %: 63

## 2011-01-01 LAB — CBC
HCT: 45
Hemoglobin: 15.5
MCHC: 34.5
MCV: 90.6
Platelets: 200
RBC: 4.97
WBC: 5.9

## 2011-01-01 LAB — POCT I-STAT CREATININE
Creatinine, Ser: 1
Operator id: 146091

## 2011-01-17 ENCOUNTER — Encounter: Payer: Self-pay | Admitting: Internal Medicine

## 2011-01-17 ENCOUNTER — Ambulatory Visit (INDEPENDENT_AMBULATORY_CARE_PROVIDER_SITE_OTHER): Payer: Managed Care, Other (non HMO) | Admitting: Internal Medicine

## 2011-01-17 DIAGNOSIS — R0989 Other specified symptoms and signs involving the circulatory and respiratory systems: Secondary | ICD-10-CM

## 2011-01-17 DIAGNOSIS — F418 Other specified anxiety disorders: Secondary | ICD-10-CM

## 2011-01-17 DIAGNOSIS — F341 Dysthymic disorder: Secondary | ICD-10-CM

## 2011-01-17 DIAGNOSIS — M79609 Pain in unspecified limb: Secondary | ICD-10-CM

## 2011-01-17 DIAGNOSIS — M79602 Pain in left arm: Secondary | ICD-10-CM

## 2011-01-17 DIAGNOSIS — R0609 Other forms of dyspnea: Secondary | ICD-10-CM

## 2011-01-17 DIAGNOSIS — I10 Essential (primary) hypertension: Secondary | ICD-10-CM

## 2011-01-17 DIAGNOSIS — R0600 Dyspnea, unspecified: Secondary | ICD-10-CM

## 2011-01-17 MED ORDER — FLUOXETINE HCL 40 MG PO CAPS: 40.0000 mg | ORAL_CAPSULE | Freq: Every day | ORAL | Status: DC

## 2011-01-17 NOTE — Patient Instructions (Signed)
Breathing - shortness of breath with exertion - normal lungs on exam by me today. Plan - pre and post bronchodilator pulmonary function study.   Left hand and forearm pain - appears to be a tendonitis. Plan - heat, range of motion and otc NSAIDs (ibuprofen or naproxen).  Insomnia - sounds like anxiety with depression. Plan - talking therapy is a key to ?"fixing" the problem. [phone]. Alternative Dr. Wandalee Ferdinand. Prozac 40 mg once a day. It will take several weeks to see full benefit but you will see some benefit like decreased irritability early on. Please come back in a month for a recheck and med adjustment.

## 2011-01-18 DIAGNOSIS — F418 Other specified anxiety disorders: Secondary | ICD-10-CM | POA: Insufficient documentation

## 2011-01-18 DIAGNOSIS — F325 Major depressive disorder, single episode, in full remission: Secondary | ICD-10-CM | POA: Insufficient documentation

## 2011-01-18 NOTE — Assessment & Plan Note (Signed)
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 He does admit to anxiety and depression and that this may be a major factor in his insomia.  Plan - he is strongly encouraged to have counseling: short-term problem oriented focused therapy           Prozac 40 mg once a day - side effects and mechanism of medication reviewed; time to relief explained.           ROV 1 month

## 2011-01-18 NOTE — Progress Notes (Signed)
  Subjective:    Patient ID: John Kelly, male    DOB: [birth year], 45 y.o.   MRN: [id number]  HPI John Kelly returns for continued trouble with sleeping. He has been instructed in good sleep hygiene and has been following the advice given. He still has many sleepless nights. His insomnia may stem from anxiety and depression related to his divorce, his parenting, work and financial issues. On questioning he has irritability, insomnia, perseveration, anhedonia, decreased libido, lack of concentration. He denies change in appetite or being highly emotional. He has no ideation of self-harm.  For several weeks he has had minor discomfort left forearm and wrist but this is much worse over the past 24-48 hours. He does not recall any specific injury but he does do a lot of repetitive movement.  John Kelly reports that he is having DOE which is more of a problem than in the past. He does have a history of asthma but has not required treatment for some time. He does not report wheezing, nocturnal symptoms. He does not smoke and does not have environmental insult.  I have reviewed the patient's medical history in detail and updated the computerized patient record.    Review of Systems System review is negative for any constitutional, cardiac, pulmonary, GI or neuro symptoms or complaints other than as described in the HPI.     Objective:   Physical Exam Vitals reviewed - borderline BP noted Gen'l- athletic appearing white man in no acute distress Pulm - normal respirations. No wheezing on careful exam. Cor - RRR MSK - tender left forearm with supination against resistance, tender to palpation of the distal Ulna and the forearm, negative Finkelstein's maneuver.       Assessment & Plan:  1. Left arm pain - history and exam c/w strain with tendonitis.  Plan - heat, lineament, ROM exercise and otc NSAIDs  2. DOE - h/o asthma now with increasing DOE  Plan - PFTs w/ pre and post  bronchodilators.

## 2011-01-18 NOTE — Assessment & Plan Note (Signed)
BP Readings from Last 3 Encounters:  01/17/11 142/92  09/06/10 138/88  02/17/10 142/82   Generally with borderline control. Will need to address at next OV

## 2011-01-19 ENCOUNTER — Other Ambulatory Visit: Payer: Self-pay | Admitting: Internal Medicine

## 2011-01-24 NOTE — Telephone Encounter (Signed)
ok 

## 2011-01-25 NOTE — Telephone Encounter (Signed)
Refill left on [facility] voicemail for Zolpidem 55m 1 tablet at bedtime #30 x no refills.

## 2011-04-30 ENCOUNTER — Telehealth: Payer: Self-pay | Admitting: Internal Medicine

## 2011-04-30 MED ORDER — FLUOXETINE HCL 40 MG PO CAPS: 40.0000 mg | ORAL_CAPSULE | Freq: Every day | ORAL | Status: DC

## 2011-04-30 NOTE — Telephone Encounter (Signed)
The pt called and is hoping to get a refill [facility] (prozac) [facility]   Thanks!

## 2011-06-11 ENCOUNTER — Telehealth: Payer: Self-pay | Admitting: Gastroenterology

## 2011-06-11 NOTE — Telephone Encounter (Signed)
Patient states he needs a refill before his appt. Asked patient who has been prescribing Asacol since he has not been seen since March of 2011. Pt states he has not been taking it since he could not afford it. Told patient that we cannot refill at medication that he has not been taking in over a year and has not followed up with [place]. Pt states " so you just expect me to bleed". I told patient that I can ask Dr. Fuller Plan if I can give him one month refill but he is not getting 3 months at a time. Told patient that if he would have been taking his medication every day and followed up with like he is suppposed to then he might not be having these problems with his UC. Pt demanded he have his Asacol before his appt scheduled for 07/10/11. Told him I will ask the doctor and call him back. Please advise.

## 2011-06-11 NOTE — Telephone Encounter (Signed)
Refill Asacol for one month. Can refill Canasa 1000 mg supp qd for one month if he wants this Pt has been non compliant with recommended meds and follow up

## 2011-06-12 MED ORDER — MESALAMINE 800 MG PO TBEC: 2.0000 | DELAYED_RELEASE_TABLET | Freq: Three times a day (TID) | ORAL | Status: DC

## 2011-06-12 NOTE — Telephone Encounter (Signed)
Addended by: Marzella Schlein on: 06/12/2011 08:29 AM   Modules accepted: Orders

## 2011-06-12 NOTE — Telephone Encounter (Signed)
Sent Asacol to the pharmacy with one refill until appt. Pt states he does not need any suppositories because the bleeding is not that bad. Pt notified that we sent the prescription and he needs to keep his appt for any further refills. Pt agreed and verbalized understanding.

## 2011-07-10 ENCOUNTER — Ambulatory Visit (INDEPENDENT_AMBULATORY_CARE_PROVIDER_SITE_OTHER): Payer: Managed Care, Other (non HMO) | Admitting: Gastroenterology

## 2011-07-10 ENCOUNTER — Encounter: Payer: Self-pay | Admitting: Gastroenterology

## 2011-07-10 VITALS — BP 134/90 | HR 76 | Ht 67.0 in | Wt 174.0 lb

## 2011-07-10 DIAGNOSIS — K5130 Ulcerative (chronic) rectosigmoiditis without complications: Secondary | ICD-10-CM

## 2011-07-10 MED ORDER — MESALAMINE 800 MG PO TBEC: 2.0000 | DELAYED_RELEASE_TABLET | Freq: Three times a day (TID) | ORAL | Status: DC

## 2011-07-10 NOTE — Progress Notes (Signed)
History of Present Illness: This is a 46 year old male with chronic left-sided ulcerative colitis. He has not been compliant with recommended medications and office followup. He last underwent colonoscopy in November 2011 showing mildly active left-sided ulcerative colitis. He has been off 5-ASA agents for some time and developed bloody diarrhea several weeks ago. He was restarted on Asacol his colitis flare is completely resolved. He is currently asymptomatic.  Current Medications, Allergies, Past Medical History, Past Surgical History, Family History and Social History were reviewed in [employer] record.  Physical Exam: General: Well developed , well nourished, no acute distress Head: Normocephalic and atraumatic Eyes:  sclerae anicteric, EOMI Ears: Normal auditory acuity Mouth: No deformity or lesions Lungs: Clear throughout to auscultation Heart: Regular rate and rhythm; no murmurs, rubs or bruits Abdomen: Soft, non tender and non distended. No masses, hepatosplenomegaly or hernias noted. Normal Bowel sounds Musculoskeletal: Symmetrical with no gross deformities  Pulses:  Normal pulses noted Extremities: No clubbing, cyanosis, edema or deformities noted Neurological: Alert oriented x 4, grossly nonfocal Psychological:  Alert and cooperative. Normal mood and affect  Assessment and Recommendations:  1. Left-sided ulcerative colitis. Continue Asacol 1.6 g 3 times a day long term. I advised him on the importance of long-term maintenance therapy to help prevent flares and to suppress inflammation which may decrease the risk of colon cancer associated with ulcerative colitis. Surveillance colonoscopy recommended November 2013.

## 2011-07-10 NOTE — Patient Instructions (Addendum)
We have sent the following medications to your mail order pharmacy:Asacol HD 800 mg.  cc: Adella Hare, MD

## 2011-08-01 ENCOUNTER — Other Ambulatory Visit: Payer: Self-pay | Admitting: Internal Medicine

## 2011-12-10 ENCOUNTER — Ambulatory Visit (INDEPENDENT_AMBULATORY_CARE_PROVIDER_SITE_OTHER): Payer: Managed Care, Other (non HMO) | Admitting: Internal Medicine

## 2011-12-10 ENCOUNTER — Other Ambulatory Visit (INDEPENDENT_AMBULATORY_CARE_PROVIDER_SITE_OTHER): Payer: Managed Care, Other (non HMO)

## 2011-12-10 ENCOUNTER — Encounter: Payer: Self-pay | Admitting: Internal Medicine

## 2011-12-10 ENCOUNTER — Telehealth: Payer: Self-pay | Admitting: Internal Medicine

## 2011-12-10 VITALS — BP 160/90 | HR 84 | Temp 98.0°F | Resp 16 | Wt 178.2 lb

## 2011-12-10 DIAGNOSIS — E781 Pure hyperglyceridemia: Secondary | ICD-10-CM

## 2011-12-10 DIAGNOSIS — I10 Essential (primary) hypertension: Secondary | ICD-10-CM

## 2011-12-10 LAB — CBC WITH DIFFERENTIAL/PLATELET
Basophils Relative: 0.3 % (ref 0.0–3.0)
HCT: 49.3 % (ref 39.0–52.0)
Hemoglobin: 16.5 g/dL (ref 13.0–17.0)
Lymphocytes Relative: 23.8 % (ref 12.0–46.0)
Lymphs Abs: 1.6 10*3/uL (ref 0.7–4.0)
Monocytes Relative: 7.9 % (ref 3.0–12.0)
Neutro Abs: 4.6 10*3/uL (ref 1.4–7.7)
RBC: 5.08 Mil/uL (ref 4.22–5.81)

## 2011-12-10 MED ORDER — AMLODIPINE-OLMESARTAN 5-40 MG PO TABS: 1.0000 | ORAL_TABLET | Freq: Every day | ORAL | Status: DC

## 2011-12-10 NOTE — Telephone Encounter (Signed)
ER CALL. Caller: John Kelly/Patient; Patient Name: John Kelly; PCP: Adella Hare (Adults only); Best Callback Phone Number: [phone]; Call regarding elevated Blood pressure, BP 188/100 on 9-23.  Patient has Headache and Lightheaded.  Hypertension protocol used, see in 4 hours, next available appointment scheduled at 1625 with Dr Ronnald Ramp on 9-23.  Patient verbalized understanding.

## 2011-12-10 NOTE — Patient Instructions (Signed)

## 2011-12-10 NOTE — Progress Notes (Signed)
  Subjective:    Patient ID: John Kelly, male    DOB: 1965/06/09, 46 y.o.   MRN: [id number]  Hypertension This is a chronic problem. The current episode started more than 1 year ago. The problem has been gradually worsening since onset. The problem is uncontrolled. Associated symptoms include headaches. Pertinent negatives include no anxiety, blurred vision, chest pain, malaise/fatigue, neck pain, orthopnea, palpitations, peripheral edema, PND, shortness of breath or sweats. Past treatments include nothing. There are no compliance problems.       Review of Systems  Constitutional: Negative for fever, chills, malaise/fatigue, diaphoresis, activity change, appetite change, fatigue and unexpected weight change.  HENT: Negative for neck pain.   Eyes: Negative.  Negative for blurred vision.  Respiratory: Negative for apnea, cough, choking, chest tightness, shortness of breath, wheezing and stridor.   Cardiovascular: Negative for chest pain, palpitations, orthopnea, leg swelling and PND.  Gastrointestinal: Negative for nausea, vomiting, abdominal pain, diarrhea, constipation and blood in stool.  Genitourinary: Negative.   Musculoskeletal: Negative for myalgias, back pain, joint swelling, arthralgias and gait problem.  Skin: Negative for color change, pallor, rash and wound.  Neurological: Positive for headaches. Negative for dizziness, tremors, seizures, syncope, facial asymmetry, speech difficulty, weakness, light-headedness and numbness.  Hematological: Negative for adenopathy. Does not bruise/bleed easily.  Psychiatric/Behavioral: Negative.        Objective:   Physical Exam  Vitals reviewed. Constitutional: He is oriented to person, place, and time. He appears well-developed and well-nourished. No distress.  HENT:  Head: Normocephalic and atraumatic.  Mouth/Throat: Oropharynx is clear and moist. No oropharyngeal exudate.  Eyes: Conjunctivae normal are normal. Right eye exhibits no  discharge. Left eye exhibits no discharge. No scleral icterus.  Neck: Normal range of motion. Neck supple. No JVD present. No tracheal deviation present. No thyromegaly present.  Cardiovascular: Normal rate, regular rhythm, normal heart sounds and intact distal pulses.  Exam reveals no gallop and no friction rub.   No murmur heard. Pulmonary/Chest: Effort normal and breath sounds normal. No stridor. No respiratory distress. He has no wheezes. He has no rales. He exhibits no tenderness.  Abdominal: Soft. Bowel sounds are normal. He exhibits no distension and no mass. There is no tenderness. There is no rebound and no guarding.  Musculoskeletal: Normal range of motion. He exhibits no edema and no tenderness.  Lymphadenopathy:    He has no cervical adenopathy.  Neurological: He is oriented to person, place, and time.  Skin: Skin is warm and dry. No rash noted. He is not diaphoretic. No erythema. No pallor.  Psychiatric: He has a normal mood and affect. His behavior is normal. Judgment and thought content normal.      Lab Results  Component Value Date   WBC 5.0 03/31/2009   HGB 15.4 03/31/2009   HCT 47.1 03/31/2009   PLT 172.0 03/31/2009   GLUCOSE 108* 11/10/2009   CHOL 184 10/10/2006   TRIG 301* 10/10/2006   HDL 23.7* 10/10/2006   LDLDIRECT 94.1 10/10/2006   NA 140 11/10/2009   K 4.3 11/10/2009   CL 106 11/10/2009   CREATININE 1.0 11/10/2009   BUN 9 11/10/2009   CO2 30 11/10/2009   TSH 2.07 11/10/2009      Assessment & Plan:

## 2011-12-11 LAB — COMPREHENSIVE METABOLIC PANEL
AST: 28 U/L (ref 0–37)
BUN: 12 mg/dL (ref 6–23)
CO2: 28 mEq/L (ref 19–32)
Calcium: 9.2 mg/dL (ref 8.4–10.5)
Chloride: 101 mEq/L (ref 96–112)
Creatinine, Ser: 1.2 mg/dL (ref 0.4–1.5)
GFR: 69.62 mL/min (ref >60.00)
Total Bilirubin: 0.8 mg/dL (ref 0.3–1.2)

## 2011-12-11 LAB — LIPID PANEL
Cholesterol: 216 mg/dL — ABNORMAL HIGH (ref 0–200)
HDL: 50.1 mg/dL (ref >39.00)
Triglycerides: 128 mg/dL (ref 0.0–149.0)
VLDL: 25.6 mg/dL (ref 0.0–40.0)

## 2011-12-11 LAB — URINALYSIS, ROUTINE W REFLEX MICROSCOPIC
Ketones, ur: NEGATIVE
Specific Gravity, Urine: 1.02 (ref 1.000–1.030)
Total Protein, Urine: NEGATIVE
Urine Glucose: NEGATIVE
pH: 6 (ref 5.0–8.0)

## 2011-12-11 NOTE — Assessment & Plan Note (Signed)
BP is not well controlled, I have asked him to start Azor and will check labs today to look for secondary causes and end organ damage

## 2011-12-11 NOTE — Assessment & Plan Note (Signed)
I will recheck his lipids today though he is not fasting

## 2011-12-12 ENCOUNTER — Encounter: Payer: Self-pay | Admitting: Internal Medicine

## 2012-01-01 ENCOUNTER — Encounter: Payer: Self-pay | Admitting: Gastroenterology

## 2012-03-03 ENCOUNTER — Other Ambulatory Visit: Payer: Self-pay | Admitting: Internal Medicine

## 2012-03-04 ENCOUNTER — Other Ambulatory Visit: Payer: Self-pay | Admitting: *Deleted

## 2012-03-04 MED ORDER — FLUOXETINE HCL 40 MG PO CAPS: 40.0000 mg | ORAL_CAPSULE | Freq: Every day | ORAL | Status: DC

## 2012-03-21 ENCOUNTER — Ambulatory Visit (INDEPENDENT_AMBULATORY_CARE_PROVIDER_SITE_OTHER)
Admission: RE | Admit: 2012-03-21 | Discharge: 2012-03-21 | Disposition: A | Discharge status: Home / Self Care | Payer: Managed Care, Other (non HMO) | Source: Ambulatory Visit | Attending: Internal Medicine | Admitting: Internal Medicine

## 2012-03-21 ENCOUNTER — Ambulatory Visit (INDEPENDENT_AMBULATORY_CARE_PROVIDER_SITE_OTHER): Payer: Managed Care, Other (non HMO) | Admitting: Internal Medicine

## 2012-03-21 ENCOUNTER — Encounter: Payer: Self-pay | Admitting: Internal Medicine

## 2012-03-21 VITALS — BP 132/88 | HR 78 | Temp 98.5°F | Ht 67.0 in | Wt 174.5 lb

## 2012-03-21 DIAGNOSIS — J209 Acute bronchitis, unspecified: Secondary | ICD-10-CM

## 2012-03-21 MED ORDER — LEVOFLOXACIN 500 MG PO TABS: 500.0000 mg | ORAL_TABLET | Freq: Every day | ORAL | Status: DC

## 2012-03-21 MED ORDER — PHENYLEPH-PROMETHAZINE-COD 5-6.25-10 MG/5ML PO SYRP: 5.0000 mL | ORAL_SOLUTION | Freq: Every evening | ORAL | Status: DC | PRN

## 2012-03-21 NOTE — Patient Instructions (Addendum)

## 2012-03-21 NOTE — Progress Notes (Signed)
HPI  Pt presents to the clinic today with c/o high fevers of 102-103, body aches sore throat and headache. That started 1 week ago and lasted for about 4-5 days then he seemed to be getting better. Then yesterday the fatigue got worse and he started having a cough that produced thick green sputum. His fever did not return. He took tylenol cold and flu without any relief. The symptoms are getting worse. He wants to make sure that he does not have pneumonia. He has had sick contacts.  Review of Systems    Past Medical History  Diagnosis Date  . Allergic rhinitis   . GERD (gastroesophageal reflux disease)   . Hypertension   . DDD (degenerative disc disease)   . Insomnia   . Ulcerative colitis     left sided  . Abnormal liver function test     Family History  Problem Relation Age of Onset  . Coronary artery disease Mother   . Heart attack Mother   . Hyperlipidemia Mother   . Hypertension Mother   . Colitis Mother   . Diabetes Father   . Hyperlipidemia Father   . Hypertension Father   . Coronary artery disease Father   . Heart attack Father   . Coronary artery disease Brother   . Heart attack Brother 44    4 stents, smoker  . Diabetes Brother   . Hypertension Brother     History   Social History  . Marital Status: Legally Separated    Spouse Name: N/A    Number of Children: 2  . Years of Education: N/A   Occupational History  . Steel distribution    Social History Main Topics  . Smoking status: Never Smoker   . Smokeless tobacco: Never Used  . Alcohol Use: No     Comment: rarely  . Drug Use: No  . Sexually Active: Not Currently   Other Topics Concern  . Not on file   Social History Narrative   HSGMarried '871 son- '92, 1 daughter - '97Work: Lobbyist- shipping/ rec'ing; quality managementPatient has never smokedAlcohol use- noIllicit Drug use- no Patient gets regular exercise.    Allergies  Allergen Reactions  . Diclofenac Sodium     REACTION:  swelling, respiratory difficulty  . Diclofenac Sodium   . Metaxalone     REACTION: Nausea/vomiting     Constitutional: Positive headache, fatigue and fever. Denies abrupt weight changes.  HEENT:  Positive sore throat. Denies eye redness, eye pain, pressure behind the eyes, facial pain, nasal congestion,  ear pain, ringing in the ears, wax buildup, runny nose or bloody nose. Respiratory: Positive cough and thick green sputum production. Denies difficulty breathing or shortness of breath.  Cardiovascular: Denies chest pain, chest tightness, palpitations or swelling in the hands or feet.   No other specific complaints in a complete review of systems (except as listed in HPI above).  Objective:    General: Appears his stated age, well developed, well nourished in NAD. HEENT: Head: normal shape and size; Eyes: sclera white, no icterus, conjunctiva pink, PERRLA and EOMs intact; Ears: Tm's gray and intact, normal light reflex; Nose: mucosa pink and moist, septum midline; Throat/Mouth: + PND. Teeth present, mucosa pink and moist, no exudate noted, no lesions or ulcerations noted.  Neck: Mild cervical lymphadenopathy. Neck supple, trachea midline. No massses, lumps or thyromegaly present.  Cardiovascular: Normal rate and rhythm. S1,S2 noted.  No murmur, rubs or gallops noted. No JVD or BLE edema. No carotid  bruits noted. Pulmonary/Chest: Normal effort and scattered ronchi throughout. No respiratory distress. No wheezes, rales or ronchi noted.      Assessment & Plan:   Acute bronchitis  Get plenty of rest and drink plenty of fluids eRx given for Levaquin x 7 days eRx for codeine cough syrup Chest xray to r/o pneumonia  RTC as needed or if symptoms persist.

## 2012-05-14 ENCOUNTER — Ambulatory Visit (INDEPENDENT_AMBULATORY_CARE_PROVIDER_SITE_OTHER): Payer: Managed Care, Other (non HMO) | Admitting: Nurse Practitioner

## 2012-05-14 ENCOUNTER — Encounter: Payer: Self-pay | Admitting: Nurse Practitioner

## 2012-05-14 ENCOUNTER — Telehealth: Payer: Self-pay | Admitting: Gastroenterology

## 2012-05-14 VITALS — BP 128/78 | HR 76 | Ht 66.75 in | Wt 178.1 lb

## 2012-05-14 DIAGNOSIS — K5190 Ulcerative colitis, unspecified, without complications: Secondary | ICD-10-CM

## 2012-05-14 DIAGNOSIS — K5120 Ulcerative (chronic) proctitis without complications: Secondary | ICD-10-CM

## 2012-05-14 MED ORDER — MESALAMINE 1000 MG RE SUPP: 1000.0000 mg | Freq: Two times a day (BID) | RECTAL | Status: DC

## 2012-05-14 MED ORDER — MOVIPREP 100 G PO SOLR: 1.0000 | Freq: Once | ORAL | Status: AC

## 2012-05-14 MED ORDER — BALSALAZIDE DISODIUM 750 MG PO CAPS: ORAL_CAPSULE | ORAL | Status: DC

## 2012-05-14 MED ORDER — MESALAMINE 1.2 G PO TBEC: DELAYED_RELEASE_TABLET | ORAL | Status: DC

## 2012-05-14 NOTE — Telephone Encounter (Signed)
C/o abdominal abdominal pain on the left side, mucous and bloody bowel movements for several days.  He has been off of his Asacol since last office visit.  He is requesting a new rx and something to "calm things down before my trip to [place] next week".  Patient will come in and see Tye Savoy RNP at 3:30 today

## 2012-05-14 NOTE — Patient Instructions (Addendum)
We sent prescriptions for Generic Balsalazide and Cansasa suppositories.  Also the colonoscopy prep-Moviprep . Prescriptions sent to [facility].  You have been scheduled for a colonoscopy with propofol. Please follow written instructions given to you at your visit today.  Please pick up your prep kit at the pharmacy within the next 1-3 days. If you use inhalers (even only as needed) or a CPAP machine, please bring them with you on the day of your procedure.

## 2012-05-14 NOTE — Progress Notes (Signed)
Reviewed and agree with management plan.  Leata Dominy T. Muhammadali Ries, MD FACG 

## 2012-05-14 NOTE — Progress Notes (Signed)
05/14/2012 John Kelly [id number] 10/20/1965   History of Present Illness:   Patient is a 47 year-old male known to Dr. Fuller Plan for history of chronic left-sided ulcerative colitis.. There have been some compliance issues regarding medications and followup appointments. Last colonoscopy November 2011 compatible with mildly active left-sided UC. Patient was last seen April 2013 at which time he had restarted himself on mesalamine for flare like symptoms and was feeling better . Patient was advised to stay on Asacol. His surveillance colonoscopy was supposed to be November 2013 and we sent a letter to the patient informing him of that in October 2013.   Patient called the office this morning with complaints of abdominal pain and bloody, mucoid stools over the last 3 weeks. No diarrhea. Patient hasn't been on any UC meds for a few months secondary to cost. Patient definitely feels like he is in a flare.      Current Medications, Allergies, Past Medical History, Past Surgical History, Family History and Social History were reviewed in [employer] record.   Physical Exam: General: Well developed , white male in no acute distress Head: Normocephalic and atraumatic Eyes:  sclerae anicteric, conjunctiva pink  Ears: Normal auditory acuity Lungs: Clear throughout to auscultation Heart: Regular rate and rhythm Abdomen: Soft,  non distended,mild diffuse lower abdominal tenderness.  No masses, no hepatomegaly. Normal bowel sounds Musculoskeletal: Symmetrical with no gross deformities  Extremities: No edema  Neurological: Alert oriented x 4, grossly nonfocal Psychological:  Alert and cooperative. Normal mood and affect  Assessment and Recommendations:  Ulcerative colitis with diffuse lower abdominal pain, bloody, mucoid stools. He isn't having diarrhea but does have some associated urgency. Patient has been off Asacol for several months secondary to costs.   We called [employer]  while patient was in clinic. Asacol is not covered, Colazol is preferred. Will begin Colazol 753m 3 daily.   To help his current flare will try Canasa supp BID x 30 days which [employer] will pay for.   If no improvement over the next few days patient will call the office as he may need a course of steroids.   Patient is overdue for surveillance colonoscopy which will be scheduled. The risks, benefits, and alternatives to colonoscopy with possible biopsy and possible polypectomy were discussed with the patient and he consents to proceed.

## 2012-05-15 ENCOUNTER — Encounter: Payer: Self-pay | Admitting: Gastroenterology

## 2012-05-30 ENCOUNTER — Ambulatory Visit (AMBULATORY_SURGERY_CENTER): Payer: Managed Care, Other (non HMO) | Admitting: Gastroenterology

## 2012-05-30 ENCOUNTER — Encounter: Payer: Self-pay | Admitting: Gastroenterology

## 2012-05-30 VITALS — BP 113/70 | HR 71 | Temp 97.9°F | Resp 15 | Ht 66.75 in | Wt 178.0 lb

## 2012-05-30 DIAGNOSIS — K5190 Ulcerative colitis, unspecified, without complications: Secondary | ICD-10-CM

## 2012-05-30 DIAGNOSIS — D126 Benign neoplasm of colon, unspecified: Secondary | ICD-10-CM

## 2012-05-30 DIAGNOSIS — Z1211 Encounter for screening for malignant neoplasm of colon: Secondary | ICD-10-CM

## 2012-05-30 DIAGNOSIS — K5120 Ulcerative (chronic) proctitis without complications: Secondary | ICD-10-CM

## 2012-05-30 DIAGNOSIS — K5289 Other specified noninfective gastroenteritis and colitis: Secondary | ICD-10-CM

## 2012-05-30 MED ORDER — SODIUM CHLORIDE 0.9 % IV SOLN: 500.0000 mL | INTRAVENOUS | Status: DC

## 2012-05-30 NOTE — Progress Notes (Signed)
Report to pacu rn, vss, bbs=clear 

## 2012-05-30 NOTE — Patient Instructions (Addendum)
YOU HAD AN ENDOSCOPIC PROCEDURE TODAY AT THE [facility]: Refer to the procedure report that was given to you for any specific questions about what was found during the examination.  If the procedure report does not answer your questions, please call your gastroenterologist to clarify.  If you requested that your care partner not be given the details of your procedure findings, then the procedure report has been included in a sealed envelope for you to review at your convenience later.  YOU SHOULD EXPECT: Some feelings of bloating in the abdomen. Passage of more gas than usual.  Walking can help get rid of the air that was put into your GI tract during the procedure and reduce the bloating. If you had a lower endoscopy (such as a colonoscopy or flexible sigmoidoscopy) you may notice spotting of blood in your stool or on the toilet paper. If you underwent a bowel prep for your procedure, then you may not have a normal bowel movement for a few days.  DIET: Your first meal following the procedure should be a light meal and then it is ok to progress to your normal diet.  A half-sandwich or bowl of soup is an example of a good first meal.  Heavy or fried foods are harder to digest and may make you feel nauseous or bloated.  Likewise meals heavy in dairy and vegetables can cause extra gas to form and this can also increase the bloating.  Drink plenty of fluids but you should avoid alcoholic beverages for 24 hours.  ACTIVITY: Your care partner should take you home directly after the procedure.  You should plan to take it easy, moving slowly for the rest of the day.  You can resume normal activity the day after the procedure however you should NOT DRIVE or use heavy machinery for 24 hours (because of the sedation medicines used during the test).    SYMPTOMS TO REPORT IMMEDIATELY: A gastroenterologist can be reached at any hour.  During normal business hours, 8:30 AM to 5:00 PM Monday through Friday,  call [phone].  After hours and on weekends, please call the GI answering service at [phone] who will take a message and have the physician on call contact you.   Following lower endoscopy (colonoscopy or flexible sigmoidoscopy):  Excessive amounts of blood in the stool  Significant tenderness or worsening of abdominal pains  Swelling of the abdomen that is new, acute  Fever of 100F or higher    FOLLOW UP: If any biopsies were taken you will be contacted by phone or by letter within the next 1-3 weeks.  Call your gastroenterologist if you have not heard about the biopsies in 3 weeks.  Our staff will call the home number listed on your records the next business day following your procedure to check on you and address any questions or concerns that you may have at that time regarding the information given to you following your procedure. This is a courtesy call and so if there is no answer at the home number and we have not heard from you through the emergency physician on call, we will assume that you have returned to your regular daily activities without incident.  SIGNATURES/CONFIDENTIALITY: You and/or your care partner have signed paperwork which will be entered into your electronic medical record.  These signatures attest to the fact that that the information above on your After Visit Summary has been reviewed and is understood.  Full responsibility of the confidentiality   of this discharge information lies with you and/or your care-partner.     

## 2012-05-30 NOTE — Progress Notes (Signed)
Patient did not experience any of the following events: a burn prior to discharge; a fall within the facility; wrong site/side/patient/procedure/implant event; or a hospital transfer or hospital admission upon discharge from the facility. (G8907) Patient did not have preoperative order for IV antibiotic SSI prophylaxis. (G8918)  

## 2012-05-30 NOTE — Progress Notes (Signed)
The pt would not pass gas while in the bed in the recovery room.  He did not c/o abdominal pain or any other sx.  I encouraged him to pass gas.  I offered to let him go to the restroom and he agreed to do so.maw  Per the pt he did pass a good amount of gas in the rest room.  No c/oabdominal pain noted. Maw

## 2012-05-30 NOTE — Progress Notes (Signed)
No complaints noted in the recovery room. Maw

## 2012-05-30 NOTE — Op Note (Signed)
[address] [place], [id number]   COLONOSCOPY PROCEDURE REPORT  PATIENT: John Kelly, John Kelly  MR#: [id number] BIRTHDATE: 14-Jan-1966 , 46  yrs. 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 MEDICATIONS: MAC sedation, administered by CRNA and propofol (Diprivan) 233m IV DESCRIPTION OF PROCEDURE:   After the risks benefits and alternatives of the procedure were thoroughly explained, informed consent was obtained.  A digital rectal exam revealed no abnormalities of the rectum.   The LB CF-[id number] endoscope was introduced through the anus and advanced to the cecum, which was identified by both the appendix and ileocecal valve. No adverse events experienced.   The quality of the prep was good, using MoviPrep  The instrument was then slowly withdrawn as the colon was fully examined.  COLON FINDINGS: Abnormal mucosa was found in the rectum and sigmoid colon to 25 cm.  The mucosa was friable, had granularity and loss of vascularity.  This was  consistent with ulcerative colitis disease.  Multiple biopsies of the area were performed.   The colon was otherwise normal.  There was no diverticulosis, inflammation, polyps or cancers unless previously stated.  Retroflexed views revealed no abnormalities.  Multiple random biopsies performed. The time to cecum=1 minutes 35 seconds.  Withdrawal time=8 minutes 21 seconds.  The scope was withdrawn and the procedure completed.  COMPLICATIONS: There were no complications.  ENDOSCOPIC IMPRESSION: 1.   Mild proctosigmoiditis; multiple biopsies performed 2.   The colon was otherwise normal  RECOMMENDATIONS: 1.  Await pathology results 2.  Repeat Colonoscopy in 2 years   eSigned:  MLadene Artist MD, [facility] 05/30/2012 2:26 PM

## 2012-05-30 NOTE — Progress Notes (Signed)
Called to room to assist during endoscopic procedure.  Patient ID and intended procedure confirmed with present staff. Received instructions for my participation in the procedure from the performing physician. ewm 

## 2012-06-02 ENCOUNTER — Telehealth: Payer: Self-pay

## 2012-06-02 NOTE — Telephone Encounter (Signed)
  Follow up Call-  Call back number 05/30/2012  Post procedure Call Back phone  # [phone]  Permission to leave phone message Yes     Patient questions:  Do you have a fever, pain , or abdominal swelling? no Pain Score  0 *  Have you tolerated food without any problems? yes  Have you been able to return to your normal activities? yes  Do you have any questions about your discharge instructions: Diet   no Medications  no Follow up visit  no  Do you have questions or concerns about your Care? no  Actions: * If pain score is 4 or above: No action needed, pain <4.

## 2012-06-08 ENCOUNTER — Encounter: Payer: Self-pay | Admitting: Gastroenterology

## 2013-03-16 ENCOUNTER — Telehealth: Payer: Self-pay | Admitting: Gastroenterology

## 2013-03-16 MED ORDER — MESALAMINE 1000 MG RE SUPP: 1000.0000 mg | Freq: Two times a day (BID) | RECTAL | Status: DC

## 2013-03-16 NOTE — Telephone Encounter (Signed)
Sent prescription to [facility]

## 2013-05-04 ENCOUNTER — Telehealth: Payer: Self-pay | Admitting: Gastroenterology

## 2013-05-04 NOTE — Telephone Encounter (Signed)
Patient with some pain and rectal bleeding.  He is scheduled to see Dr. Fuller Plan on 05/06/13 3:45.  He will continue on his current medications until office visit

## 2013-05-06 ENCOUNTER — Other Ambulatory Visit: Payer: Managed Care, Other (non HMO)

## 2013-05-06 ENCOUNTER — Encounter: Payer: Self-pay | Admitting: Gastroenterology

## 2013-05-06 ENCOUNTER — Ambulatory Visit (INDEPENDENT_AMBULATORY_CARE_PROVIDER_SITE_OTHER): Payer: Managed Care, Other (non HMO) | Admitting: Gastroenterology

## 2013-05-06 VITALS — BP 168/90 | HR 60 | Ht 66.75 in | Wt 173.0 lb

## 2013-05-06 DIAGNOSIS — K5130 Ulcerative (chronic) rectosigmoiditis without complications: Secondary | ICD-10-CM

## 2013-05-06 MED ORDER — MESALAMINE 1000 MG RE SUPP: 1000.0000 mg | Freq: Two times a day (BID) | RECTAL | Status: DC

## 2013-05-06 MED ORDER — BALSALAZIDE DISODIUM 750 MG PO CAPS: 2250.0000 mg | ORAL_CAPSULE | Freq: Three times a day (TID) | ORAL | Status: DC

## 2013-05-06 NOTE — Progress Notes (Signed)
    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 Compliance issues have been a problem in the past. He relates a flare of symptoms about 2 months ago and used Canasa suppositories for 30 days and his symptoms abated. He feels he developed gastroenteritis with a low-grade fever and frequent urgent diarrhea that was slightly different different than his typical UC symptoms. He is now having frequent postprandial diarrhea, mild left lower quadrant pain and slight amounts of rectal bleeding.   Current Medications, Allergies, Past Medical History, Past Surgical History, Family History and Social History were reviewed in [employer] record.  Physical Exam: General: Well developed , well nourished, no acute distress Head: Normocephalic and atraumatic Eyes:  sclerae anicteric, EOMI Ears: Normal auditory acuity Mouth: No deformity or lesions Lungs: Clear throughout to auscultation Heart: Regular rate and rhythm; no murmurs, rubs or bruits Abdomen: Soft, non tender and non distended. No masses, hepatosplenomegaly or hernias noted. Normal Bowel sounds Musculoskeletal: Symmetrical with no gross deformities  Pulses:  Normal pulses noted Extremities: No clubbing, cyanosis, edema or deformities noted Neurological: Alert oriented x 4, grossly nonfocal Psychological:  Alert and cooperative. Normal mood and affect  Assessment and Recommendations:  1. Ulcerative proctosigmoiditis flare. R/O superimposed infection. GI pathogen panel. Increase Colazal to 750 mg tid. Resume Canasa suppositories twice a day. Return office visit in 4 weeks.

## 2013-05-06 NOTE — Patient Instructions (Addendum)
Your physician has requested that you go to the basement for the following lab work before leaving today: GI pathogen panel.  We have sent the following medications to your pharmacy for you to pick up at your convenience: Colazal, Canasa.  Thank you for choosing me and [facility].  Pricilla Riffle. Dagoberto Ligas., MD., Marval Regal

## 2013-05-12 LAB — GASTROINTESTINAL PATHOGEN PANEL PCR
C. difficile Tox A/B, PCR: NEGATIVE
CAMPYLOBACTER, PCR: NEGATIVE
CRYPTOSPORIDIUM, PCR: NEGATIVE
E COLI (ETEC) LT/ST, PCR: NEGATIVE
E COLI 0157, PCR: NEGATIVE
E coli (STEC) stx1/stx2, PCR: NEGATIVE
GIARDIA LAMBLIA, PCR: NEGATIVE
Norovirus, PCR: NEGATIVE
Rotavirus A, PCR: NEGATIVE
SHIGELLA, PCR: NEGATIVE
Salmonella, PCR: NEGATIVE

## 2013-05-13 ENCOUNTER — Telehealth: Payer: Self-pay | Admitting: Gastroenterology

## 2013-05-13 NOTE — Telephone Encounter (Signed)
Patient advised of the results

## 2013-06-03 ENCOUNTER — Telehealth: Payer: Self-pay | Admitting: Internal Medicine

## 2013-06-03 MED ORDER — ZOLPIDEM TARTRATE 10 MG PO TABS: 10.0000 mg | ORAL_TABLET | Freq: Every evening | ORAL | Status: DC | PRN

## 2013-06-03 NOTE — Telephone Encounter (Signed)
okey dokey # [phone] 3 refills

## 2013-06-03 NOTE — Telephone Encounter (Signed)
Patient is asking for a new prescription for zolpidem (AMBIEN) 10 MG tablet. States that he has not slept all week. He uses [facility] in [place] on [address]. Please advise.

## 2013-06-03 NOTE — Telephone Encounter (Signed)
Zolpidem has been called in

## 2013-06-03 NOTE — Addendum Note (Signed)
Addended by: Roma Schanz R on: 06/03/2013 01:40 PM   Modules accepted: Orders

## 2013-06-15 ENCOUNTER — Encounter: Payer: Self-pay | Admitting: Internal Medicine

## 2013-06-15 ENCOUNTER — Other Ambulatory Visit: Payer: Managed Care, Other (non HMO)

## 2013-06-15 ENCOUNTER — Telehealth: Payer: Self-pay | Admitting: Internal Medicine

## 2013-06-15 ENCOUNTER — Ambulatory Visit (INDEPENDENT_AMBULATORY_CARE_PROVIDER_SITE_OTHER): Payer: Managed Care, Other (non HMO) | Admitting: Internal Medicine

## 2013-06-15 VITALS — BP 160/90 | HR 78 | Temp 97.8°F | Resp 14 | Wt 173.8 lb

## 2013-06-15 DIAGNOSIS — R011 Cardiac murmur, unspecified: Secondary | ICD-10-CM

## 2013-06-15 DIAGNOSIS — I10 Essential (primary) hypertension: Secondary | ICD-10-CM

## 2013-06-15 DIAGNOSIS — R079 Chest pain, unspecified: Secondary | ICD-10-CM

## 2013-06-15 DIAGNOSIS — F341 Dysthymic disorder: Secondary | ICD-10-CM

## 2013-06-15 DIAGNOSIS — F418 Other specified anxiety disorders: Secondary | ICD-10-CM

## 2013-06-15 MED ORDER — METOPROLOL TARTRATE 25 MG PO TABS: 25.0000 mg | ORAL_TABLET | Freq: Two times a day (BID) | ORAL | Status: DC

## 2013-06-15 MED ORDER — FLUOXETINE HCL 20 MG PO TABS: 20.0000 mg | ORAL_TABLET | Freq: Every day | ORAL | Status: DC

## 2013-06-15 NOTE — Progress Notes (Signed)
Pre visit review using our clinic review tool, if applicable. No additional management support is needed unless otherwise documented below in the visit note. 

## 2013-06-15 NOTE — Progress Notes (Signed)
   Subjective:    Patient ID: John Kelly, male    DOB: 24-Dec-1965, 48 y.o.   MRN: [id number]  HPI   He has had symptoms for approximately 2 weeks; there was no specific trigger or exacerbating factor. He has pain along the mid -lower border which can vary from-sharp. It can last hours. It is nonradiating.No associated nausea & sweating.  It is not exertional. He has a history of musculoskeletal pain particularly in the left lateral decubitus position or supine.  He questions whether his symptoms could be related to stress related to marital and family issues.  BP @ work 128-130/82-84  Past history includes colitis. He has never smoked      Review of Systems He is not having dyspepsia, dysphasia, abdominal pain, unexplained weight loss, melena, rectal bleeding except with flares of colitis.  He's had no cough or sputum production. He denies hemoptysis.  There is no associated posterior thoracic pain with anterior radiation  He has no paroxysmal nocturnal dyspnea or claudication.  He has had some lightheadedness with workout. No syncope with this.  There is no change in the color or temperature of skin in the area of the discomfort. There is no associated rash either.     Objective:   Physical Exam General appearance is one of good health and nourishment w/o distress.  Eyes: No conjunctival inflammation or scleral icterus is present.  Oral exam: Dental hygiene is good; lips and gums are healthy appearing.There is no oropharyngeal erythema or exudate noted.   Heart:  Normal rate and regular rhythm. S1 and S2 normal without gallop, murmur,  rub or other extra sounds  . Sharp apical click   Lungs:Chest clear to auscultation; no wheezes, rhonchi,rales ,or rubs present.No increased work of breathing.   He has no chest wall or sternal tenderness to palpation  Abdomen: bowel sounds normal, soft and non-tender without masses, organomegaly or hernias noted.  No guarding or  rebound . No tenderness over the flanks to percussion  Musculoskeletal: Able to lie flat and sit up without help. Negative Homan's. Gait normal  Skin:Warm & dry.  Intact without suspicious lesions or rashes ; no jaundice or tenting  Lymphatic: No lymphadenopathy is noted about the head, neck, axilla.                Assessment & Plan:  #1 atypical chest pain in the context of stress. #2 HTN  EKG reveals no ischemic change. He does have a short PR interval.  Clinically mitral prolapse is suggested which is symptomatic  Plan: See orders

## 2013-06-15 NOTE — Telephone Encounter (Signed)
Adero, Team lead at [facility] will contact patient with appointment information at [facility], pt should be seen at [facility] for his anxiety issues then call to establish care at [facility] if this is "closer" for him to travel to from his job.

## 2013-06-15 NOTE — Patient Instructions (Addendum)
Please go to Web M.D. for information about mitral valve prolapse. This is a benign condition which has no long term cardiac issues; but it can be associated with chest pain or palpitations, particularly when under stress. Your next office appointment will be determined based upon review of your pending labs& 2D ECHO. Those instructions will be transmitted to you through My Chart  OR  by mail;whichever process is your choice to receive results. Minimal Blood Pressure Goal= AVERAGE < 140/90;  Ideal is an AVERAGE < 135/85. This AVERAGE should be calculated from @ least 5-7 BP readings taken @ different times of day on different days of week. You should not respond to isolated BP readings , but rather the AVERAGE for that week .Please bring your  blood pressure cuff to office visits to verify that it is reliable.It  can also be checked against the blood pressure device at the pharmacy. Finger or wrist cuffs are not dependable; an arm cuff is.

## 2013-06-15 NOTE — Telephone Encounter (Signed)
Patient Information:  Caller Name: Humza  Phone: [phone]  Patient: John Kelly, John Kelly  Gender: Male  DOB: 03-Oct-1965  Age: 48 Years  PCP: Other  Office Follow Up:  Does the office need to follow up with this patient?: Yes  Instructions For The Office: Acute Panic Attack and requesting OV today, 06/15/2013.  RN Note:  Afebrile. Anxiety for the last 3 weeks increasing and not being helped by going to the gym. He states he has felt like this before- his wife left him with no financial support, two children and hard time coping. He feels now that it might be panic attack and yesterday felt like he was going to pass out. THe has felt dizzy, light headed but at work and states he can drive car, RN/CAN asked him to have someone else drive him to office. According to [facility]: All emergent signs and symptoms of Anxiety: Panic protocol ruled out except 'History of panic attacks AND current spisode NOT resolved'. Home Care advice given, advised appointment today- he is looking for medication that he had once before and helped. RN/CAN got permission to call back line from Resource nurse, Genice Rouge RN/CAN spoke to "[place]" who will "look for this note" and try to schedule today appropriately by asking Dr. first what office and what Dr. he should be scheduled for.  Symptoms  Reason For Call & Symptoms: Anxiety  Reviewed Health History In EMR: Yes  Reviewed Medications In EMR: Yes  Reviewed Allergies In EMR: Yes  Reviewed Surgeries / Procedures: Yes  Date of Onset of Symptoms: 05/25/2013  Guideline(s) Used:  No Protocol Available - Sick Adult  Disposition Per Guideline:   Go to Office Now  Reason For Disposition Reached:   Nursing judgment  Advice Given:  N/A  Patient Will Follow Care Advice:  YES

## 2013-06-16 LAB — TROPONIN I: Troponin I: <0.01 ng/mL (ref <0.06)

## 2013-06-16 LAB — D-DIMER, QUANTITATIVE (NOT AT ARMC): D DIMER QUANT: 0.28 ug{FEU}/mL (ref 0.00–0.48)

## 2013-09-16 ENCOUNTER — Telehealth: Payer: Self-pay | Admitting: Gastroenterology

## 2013-09-16 ENCOUNTER — Other Ambulatory Visit: Payer: Self-pay

## 2013-09-16 DIAGNOSIS — F418 Other specified anxiety disorders: Secondary | ICD-10-CM

## 2013-09-16 MED ORDER — BALSALAZIDE DISODIUM 750 MG PO CAPS: 2250.0000 mg | ORAL_CAPSULE | Freq: Three times a day (TID) | ORAL | Status: DC

## 2013-09-16 MED ORDER — MESALAMINE 1000 MG RE SUPP: 1000.0000 mg | Freq: Two times a day (BID) | RECTAL | Status: DC

## 2013-09-16 MED ORDER — FLUOXETINE HCL 20 MG PO TABS: 20.0000 mg | ORAL_TABLET | Freq: Every day | ORAL | Status: DC

## 2013-09-16 NOTE — Telephone Encounter (Signed)
Informed patient that I can send a refill of Canasa suppositories to [facility] but at his last office visit Dr. Fuller Plan wanted him to come back in 4 weeks for a follow-up visit. Patient scheduled an appt on 09/28/13 at 3:45pm. Prescription sent to pharmacy.

## 2013-09-16 NOTE — Telephone Encounter (Signed)
OK #30, R X2

## 2013-09-21 ENCOUNTER — Other Ambulatory Visit: Payer: Self-pay

## 2013-09-21 MED ORDER — BALSALAZIDE DISODIUM 750 MG PO CAPS: 750.0000 mg | ORAL_CAPSULE | Freq: Three times a day (TID) | ORAL | Status: DC

## 2013-09-22 ENCOUNTER — Ambulatory Visit (INDEPENDENT_AMBULATORY_CARE_PROVIDER_SITE_OTHER): Payer: Managed Care, Other (non HMO) | Admitting: Family Medicine

## 2013-09-22 ENCOUNTER — Encounter (INDEPENDENT_AMBULATORY_CARE_PROVIDER_SITE_OTHER): Payer: Self-pay

## 2013-09-22 ENCOUNTER — Telehealth: Payer: Self-pay | Admitting: Family Medicine

## 2013-09-22 ENCOUNTER — Encounter: Payer: Self-pay | Admitting: Family Medicine

## 2013-09-22 VITALS — BP 138/88 | HR 62 | Temp 98.2°F | Ht 67.0 in | Wt 167.8 lb

## 2013-09-22 DIAGNOSIS — Z8249 Family history of ischemic heart disease and other diseases of the circulatory system: Secondary | ICD-10-CM | POA: Insufficient documentation

## 2013-09-22 DIAGNOSIS — F418 Other specified anxiety disorders: Secondary | ICD-10-CM

## 2013-09-22 DIAGNOSIS — L609 Nail disorder, unspecified: Secondary | ICD-10-CM

## 2013-09-22 DIAGNOSIS — F341 Dysthymic disorder: Secondary | ICD-10-CM

## 2013-09-22 DIAGNOSIS — M5136 Other intervertebral disc degeneration, lumbar region: Secondary | ICD-10-CM

## 2013-09-22 DIAGNOSIS — F5104 Psychophysiologic insomnia: Secondary | ICD-10-CM

## 2013-09-22 DIAGNOSIS — R0789 Other chest pain: Secondary | ICD-10-CM

## 2013-09-22 DIAGNOSIS — R002 Palpitations: Secondary | ICD-10-CM

## 2013-09-22 DIAGNOSIS — K219 Gastro-esophageal reflux disease without esophagitis: Secondary | ICD-10-CM

## 2013-09-22 DIAGNOSIS — K5190 Ulcerative colitis, unspecified, without complications: Secondary | ICD-10-CM

## 2013-09-22 DIAGNOSIS — G4700 Insomnia, unspecified: Secondary | ICD-10-CM

## 2013-09-22 DIAGNOSIS — M5137 Other intervertebral disc degeneration, lumbosacral region: Secondary | ICD-10-CM

## 2013-09-22 DIAGNOSIS — I10 Essential (primary) hypertension: Secondary | ICD-10-CM

## 2013-09-22 DIAGNOSIS — J309 Allergic rhinitis, unspecified: Secondary | ICD-10-CM

## 2013-09-22 NOTE — Assessment & Plan Note (Signed)
On ambien for sleep, well controlled.

## 2013-09-22 NOTE — Assessment & Plan Note (Signed)
Given strong early CAD in family.  Will send for ECHO and treadmill stress test.  of note EKG today nml.  Will aslo eval for risk factors including DM and high chol.

## 2013-09-22 NOTE — Assessment & Plan Note (Signed)
Well controlled using OTC med during allergy season.

## 2013-09-22 NOTE — Progress Notes (Signed)
Pre visit review using our clinic review tool, if applicable. No additional management support is needed unless otherwise documented below in the visit note. 

## 2013-09-22 NOTE — Assessment & Plan Note (Signed)
Nail bed injury vs onchymycosis.  Given time. Eval LFTS, consider lamasil in future in nml liver function.

## 2013-09-22 NOTE — Assessment & Plan Note (Signed)
Stop caffeine supplements.  EKG stable, no arrythmia.  If persisting consider holter monitor.  Eval heart as above.  Eval with cbc, tsh, etc.

## 2013-09-22 NOTE — Patient Instructions (Addendum)
Schedule CPX with fasting labs prior in next few months. Hold work out supplements for now. Avoid caffeine. Drink water 8 x 8 oz daily Can use b12 1000 mcg daily as energy supplement. Stop at front desk to schedule ECHO stress test.

## 2013-09-22 NOTE — Assessment & Plan Note (Signed)
well controlled on prozac and ambien for sleep.

## 2013-09-22 NOTE — Telephone Encounter (Signed)
Relevant patient education assigned to patient using Emmi. ° °

## 2013-09-22 NOTE — Assessment & Plan Note (Signed)
Well controlled. Continue current medication.  

## 2013-09-22 NOTE — Assessment & Plan Note (Signed)
Well controlled on no med. 

## 2013-09-22 NOTE — Progress Notes (Signed)
   Subjective:    Patient ID: John Kelly, male    DOB: 01-Aug-1965, 48 y.o.   MRN: [id number]  HPI  48 year old male presents for transfer of care from Dr. Linda Hedges.  Hypertension:  Well controlled on metoprolol. BP Readings from Last 3 Encounters:  09/22/13 138/88  06/15/13 160/90  05/06/13 168/90  Using medication without problems or lightheadedness:  Chest pain with exertion:  Yes, see below Edema:None Short of breath:yes Other issues:   Last night had  Substernal chest pain, palpitations. BP 165/88 HR? Started on way home from gym.  Occ has to take deep breath when exercising at gym but no CP, palpitations with exertion.  Occ lightheadedness. Lasted few hours.  Previous similar episode. Saw Dr. Linna Darner  For EKG for chest pain 06/15/2013. Nml ddimer, nml CE.   Dx with ? mitral valve prolapse. Sent for ECHO but he never had this done, scheduling issue. 5 years ago... Saw cardiologist for chest pain...nml ECHO, nml treadmill stress test, felt MSK cause.  Drinks caffiene pre work out drink, creatine and testosterone booster.    Mother father and brother with CAD.  Elevated Cholesterol:  Due for re-eval. Lab Results  Component Value Date   CHOL 216* 12/10/2011   HDL 50.10 12/10/2011   LDLDIRECT 147.0 12/10/2011   TRIG 128.0 12/10/2011   CHOLHDL 4 12/10/2011   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 Currently in flare, improved with canasa and colazal.  No current GERD, on no med. Hxx of esophageal ulcers.  Depression, anxiety: well controlled on prozac and ambien for sleep.  Has noted ?  toenail fungus on right great toe x 3 months. Also dropped weight on toe several months ago.  Has tried OTC toenail laquer.  Review of Systems  Neurological:       Occ numbness in right hand       Objective:   Physical Exam  Constitutional: He is oriented to person, place, and time. Vital signs are normal. He appears well-developed and well-nourished.  HENT:  Head:  Normocephalic.  Right Ear: Hearing normal.  Left Ear: Hearing normal.  Nose: Nose normal.  Mouth/Throat: Oropharynx is clear and moist and mucous membranes are normal.  Neck: Trachea normal. Carotid bruit is not present. No mass and no thyromegaly present.  Cardiovascular: Normal rate, regular rhythm and normal pulses.  Exam reveals no gallop, no distant heart sounds and no friction rub.   No murmur heard. No peripheral edema  Pulmonary/Chest: Effort normal and breath sounds normal. No respiratory distress. He exhibits tenderness.    Mild ttp  Abdominal: There is generalized tenderness.  Neurological: He is alert and oriented to person, place, and time. No cranial nerve deficit. Coordination normal.  Skin: Skin is warm, dry and intact. No rash noted.  Psychiatric: He has a normal mood and affect. His speech is normal and behavior is normal. Thought content normal.          Assessment & Plan:

## 2013-09-24 ENCOUNTER — Telehealth (HOSPITAL_COMMUNITY): Payer: Self-pay

## 2013-09-24 NOTE — Telephone Encounter (Signed)
Encounter complete. 

## 2013-09-25 ENCOUNTER — Telehealth (HOSPITAL_COMMUNITY): Payer: Self-pay

## 2013-09-25 NOTE — Telephone Encounter (Signed)
Encounter complete. 

## 2013-09-28 ENCOUNTER — Encounter: Payer: Self-pay | Admitting: Gastroenterology

## 2013-09-28 ENCOUNTER — Ambulatory Visit (INDEPENDENT_AMBULATORY_CARE_PROVIDER_SITE_OTHER): Payer: Managed Care, Other (non HMO) | Admitting: Gastroenterology

## 2013-09-28 VITALS — BP 120/64 | HR 64 | Ht 67.0 in | Wt 170.0 lb

## 2013-09-28 DIAGNOSIS — K51318 Ulcerative (chronic) rectosigmoiditis with other complication: Secondary | ICD-10-CM

## 2013-09-28 DIAGNOSIS — K5130 Ulcerative (chronic) rectosigmoiditis without complications: Secondary | ICD-10-CM

## 2013-09-28 MED ORDER — MESALAMINE 1000 MG RE SUPP: 1000.0000 mg | Freq: Two times a day (BID) | RECTAL | Status: DC

## 2013-09-28 MED ORDER — BALSALAZIDE DISODIUM 750 MG PO CAPS: 2250.0000 mg | ORAL_CAPSULE | Freq: Three times a day (TID) | ORAL | Status: DC

## 2013-09-28 NOTE — Patient Instructions (Addendum)
We have sent the following medications to your mail order pharmacy for you: Colazal. I sent the Canasa suppositories to your local pharmacy.   Thank you for choosing me and [facility].  Pricilla Riffle. Dagoberto Ligas., MD., Marval Regal

## 2013-09-28 NOTE — Progress Notes (Signed)
    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 He states he had done well for several months but recently have a mild flare that has not responded to restarting Canasa suppositories.  Current Medications, Allergies, Past Medical History, Past Surgical History, Family History and Social History were reviewed in [employer] record.  Physical Exam: General: Well developed , well nourished, no acute distress Head: Normocephalic and atraumatic Eyes:  sclerae anicteric, EOMI Ears: Normal auditory acuity Mouth: No deformity or lesions Lungs: Clear throughout to auscultation Heart: Regular rate and rhythm; no murmurs, rubs or bruits Abdomen: Soft, non tender and non distended. No masses, hepatosplenomegaly or hernias noted. Normal Bowel sounds Musculoskeletal: Symmetrical with no gross deformities  Pulses:  Normal pulses noted Extremities: No clubbing, cyanosis, edema or deformities noted Neurological: Alert oriented x 4, grossly nonfocal Psychological:  Alert and cooperative. Normal mood and affect  Assessment and Recommendations:   1. Ulcerative proctosigmoiditis flare. Continue  Colazal 750 mg 3 po tid. Continue Canasa suppositories twice a day. May discontinue Canasa when this current flare resolves. Return office visit in 6 months.

## 2013-09-29 ENCOUNTER — Telehealth: Payer: Self-pay | Admitting: Family Medicine

## 2013-09-29 ENCOUNTER — Ambulatory Visit (HOSPITAL_COMMUNITY)
Admission: RE | Admit: 2013-09-29 | Discharge: 2013-09-29 | Disposition: A | Discharge status: Home / Self Care | Payer: Managed Care, Other (non HMO) | Source: Ambulatory Visit | Attending: Family Medicine | Admitting: Family Medicine

## 2013-09-29 ENCOUNTER — Other Ambulatory Visit: Payer: Self-pay | Admitting: *Deleted

## 2013-09-29 ENCOUNTER — Ambulatory Visit (HOSPITAL_BASED_OUTPATIENT_CLINIC_OR_DEPARTMENT_OTHER)
Admission: RE | Admit: 2013-09-29 | Discharge: 2013-09-29 | Disposition: A | Discharge status: Home / Self Care | Payer: Managed Care, Other (non HMO) | Source: Ambulatory Visit | Attending: Family Medicine | Admitting: Family Medicine

## 2013-09-29 ENCOUNTER — Other Ambulatory Visit: Payer: Self-pay

## 2013-09-29 DIAGNOSIS — R0789 Other chest pain: Secondary | ICD-10-CM

## 2013-09-29 DIAGNOSIS — R002 Palpitations: Secondary | ICD-10-CM

## 2013-09-29 DIAGNOSIS — I059 Rheumatic mitral valve disease, unspecified: Secondary | ICD-10-CM

## 2013-09-29 MED ORDER — ZOLPIDEM TARTRATE 10 MG PO TABS: 10.0000 mg | ORAL_TABLET | Freq: Every evening | ORAL | Status: DC | PRN

## 2013-09-29 NOTE — Telephone Encounter (Signed)
Correction to previous ambien rx... Call in 1 refill not 3 please.

## 2013-09-29 NOTE — Telephone Encounter (Signed)
John Kelly is requesting a refill on his Ambien.  Last refilled 06/03/2013 for #30 with 3 refills.  Ok to refill?

## 2013-09-29 NOTE — Telephone Encounter (Signed)
Correction called to [facility]

## 2013-09-29 NOTE — Progress Notes (Signed)
2D Echo Performed 09/29/2013    Marygrace Drought, RCS

## 2013-09-29 NOTE — Telephone Encounter (Addendum)
Called to [facility].  Mr. Fecher notified prescription has been called to his pharmacy.

## 2013-09-30 ENCOUNTER — Telehealth (HOSPITAL_COMMUNITY): Payer: Self-pay | Admitting: *Deleted

## 2013-10-01 ENCOUNTER — Telehealth: Payer: Self-pay | Admitting: Family Medicine

## 2013-10-01 NOTE — Telephone Encounter (Signed)
Left message for [name] that stress test was normal.

## 2013-10-01 NOTE — Telephone Encounter (Signed)
Pt returned your call, says he is waiting on lab results. Please call him back. Thank you.

## 2013-10-22 ENCOUNTER — Ambulatory Visit (INDEPENDENT_AMBULATORY_CARE_PROVIDER_SITE_OTHER): Payer: Managed Care, Other (non HMO) | Admitting: Internal Medicine

## 2013-10-22 ENCOUNTER — Encounter: Payer: Self-pay | Admitting: Internal Medicine

## 2013-10-22 ENCOUNTER — Other Ambulatory Visit (INDEPENDENT_AMBULATORY_CARE_PROVIDER_SITE_OTHER): Payer: Managed Care, Other (non HMO)

## 2013-10-22 VITALS — BP 148/91 | HR 74 | Temp 97.7°F | Wt 172.5 lb

## 2013-10-22 DIAGNOSIS — R3911 Hesitancy of micturition: Secondary | ICD-10-CM

## 2013-10-22 DIAGNOSIS — H109 Unspecified conjunctivitis: Secondary | ICD-10-CM

## 2013-10-22 DIAGNOSIS — N410 Acute prostatitis: Secondary | ICD-10-CM

## 2013-10-22 DIAGNOSIS — R300 Dysuria: Secondary | ICD-10-CM

## 2013-10-22 LAB — URINALYSIS
Bilirubin Urine: NEGATIVE
HGB URINE DIPSTICK: NEGATIVE
Ketones, ur: NEGATIVE
LEUKOCYTES UA: NEGATIVE
Nitrite: NEGATIVE
Specific Gravity, Urine: 1.015 (ref 1.000–1.030)
Total Protein, Urine: NEGATIVE
URINE GLUCOSE: NEGATIVE
Urobilinogen, UA: 0.2 (ref 0.0–1.0)
pH: 7 (ref 5.0–8.0)

## 2013-10-22 LAB — PSA: PSA: 0.66 ng/mL (ref 0.10–4.00)

## 2013-10-22 MED ORDER — CIPROFLOXACIN HCL 500 MG PO TABS: 500.0000 mg | ORAL_TABLET | Freq: Two times a day (BID) | ORAL | Status: DC

## 2013-10-22 MED ORDER — ERYTHROMYCIN 5 MG/GM OP OINT: 1.0000 "application " | TOPICAL_OINTMENT | Freq: Four times a day (QID) | OPHTHALMIC | Status: DC

## 2013-10-22 NOTE — Patient Instructions (Signed)
Drink as much nondairy fluids as possible. Avoid spicy foods or alcohol as  these may aggravate the prostate. Do not take decongestants. Avoid narcotics if possible.  Use the ophthalmologic ointment as directed. Strict hand washing before and after its application.

## 2013-10-22 NOTE — Progress Notes (Signed)
Pre visit review using our clinic review tool, if applicable. No additional management support is needed unless otherwise documented below in the visit note. 

## 2013-10-22 NOTE — Progress Notes (Signed)
   Subjective:    Patient ID: John Kelly, male    DOB: [birth year], 48 y.o.   MRN: [id number]  HPI  His symptoms began about 2 months ago as hesitancy & difficulty starting urination. He's also noted dysuria for 2-3 weeks. Intermittently he has noted some chills.  He took Azo over-the-counter without benefit.  He has had no antibiotics in  last 30 days. He has no history of recurrent urinary tract infections, kidney stones, or urinary tract abnormalities.  Additionally he has had pressure in the left eye for 2 weeks. He questions a foreign body. Yesterday he began to have pain in the eye described as throbbing. He noted some crusting this morning without frank purulence from the eye. He has had some associated discomfort in the left temple. There has been some associated blurring of vision.  Review of Systems  He has no urgency, frequency, hematuria, pyuria, flank pain, fever, or sweats. He also denies nocturia  He has no associated signs of rhinosinusitis ;specifically no frontal headache, facial pain , nasal purulence, dental pain, sore throat , otic pain or otic discharge present. No fever , chills or sweats. He has no diplopia or frank vision loss.        Objective:   Physical Exam  Positive pertinent findings include : Clinical mild scleritis of left eye.Possible  minimal conjunctivitis without purulence. Visual testing reveals some blurred vision on the left. Extraocular motion is intact w/o discomfort.  Some hemorrhoidal tags present. Prostate is upper limits of normal and slightly tender to palpation.  He appears somewhat shaky. He has a nonsustained tremor of the tongue. He did ask if he could go eat lunch and get something to drink before he went for his labs.   General appearance :adequately nourished; in no distress. He has a mustache and beard. Eyes: No  scleral icterus is present. Oral exam: Dental hygiene is good. Lips and gums are healthy appearing.There is no  oropharyngeal erythema or exudate noted.  Heart:  Normal rate and regular rhythm. S1 and S2 normal without gallop, murmur, click, rub or other extra sounds   Lungs:Chest clear to auscultation; no wheezes, rhonchi,rales ,or rubs present.No increased work of breathing.  Abdomen: bowel sounds normal, soft and non-tender without masses, organomegaly or hernias noted.  No guarding or rebound. No flank tenderness to percussion. Skin:Warm & dry.  Intact without suspicious lesions or rashes ; no jaundice or tenting Lymphatic: No lymphadenopathy is noted about the head, neck, axilla, or inguinal areas.  Genital exam is unremarkable except for left scrotal hydrocele          Assessment & Plan:  #1 hesitancy  #2 dysuria  #3 scleritis, rule out early conjunctivitis  #4 clinical prostatitis  #5 slightly tremulous; this may reflect his fasting state  See orders and recommendations. Ophthalmology assessment if symptoms no better with ophth antibiotic ointment . Add Flomax if LUTS persist with Cipro

## 2013-12-04 ENCOUNTER — Other Ambulatory Visit (INDEPENDENT_AMBULATORY_CARE_PROVIDER_SITE_OTHER): Payer: Managed Care, Other (non HMO)

## 2013-12-04 DIAGNOSIS — Z8249 Family history of ischemic heart disease and other diseases of the circulatory system: Secondary | ICD-10-CM

## 2013-12-04 DIAGNOSIS — R002 Palpitations: Secondary | ICD-10-CM

## 2013-12-04 DIAGNOSIS — R0789 Other chest pain: Secondary | ICD-10-CM

## 2013-12-04 LAB — COMPREHENSIVE METABOLIC PANEL
ALK PHOS: 48 U/L (ref 39–117)
ALT: 27 U/L (ref 0–53)
AST: 26 U/L (ref 0–37)
Albumin: 3.9 g/dL (ref 3.5–5.2)
BILIRUBIN TOTAL: 0.6 mg/dL (ref 0.2–1.2)
BUN: 12 mg/dL (ref 6–23)
CO2: 30 meq/L (ref 19–32)
Calcium: 8.7 mg/dL (ref 8.4–10.5)
Chloride: 105 mEq/L (ref 96–112)
Creatinine, Ser: 1.3 mg/dL (ref 0.4–1.5)
GFR: 62.34 mL/min (ref >60.00)
GLUCOSE: 94 mg/dL (ref 70–99)
Potassium: 4.1 mEq/L (ref 3.5–5.1)
Sodium: 142 mEq/L (ref 135–145)
Total Protein: 6.6 g/dL (ref 6.0–8.3)

## 2013-12-04 LAB — TSH: TSH: 2.29 u[IU]/mL (ref 0.35–4.50)

## 2013-12-04 LAB — LIPID PANEL
Cholesterol: 191 mg/dL (ref 0–200)
HDL: 40.3 mg/dL (ref >39.00)
LDL Cholesterol: 131 mg/dL — ABNORMAL HIGH (ref 0–99)
NonHDL: 150.7
Total CHOL/HDL Ratio: 5
Triglycerides: 100 mg/dL (ref 0.0–149.0)
VLDL: 20 mg/dL (ref 0.0–40.0)

## 2013-12-04 LAB — CBC WITH DIFFERENTIAL/PLATELET
BASOS ABS: 0 10*3/uL (ref 0.0–0.1)
Basophils Relative: 0.4 % (ref 0.0–3.0)
EOS ABS: 0.3 10*3/uL (ref 0.0–0.7)
Eosinophils Relative: 3.7 % (ref 0.0–5.0)
HCT: 44.8 % (ref 39.0–52.0)
Hemoglobin: 15 g/dL (ref 13.0–17.0)
Lymphocytes Relative: 31.4 % (ref 12.0–46.0)
Lymphs Abs: 2.1 10*3/uL (ref 0.7–4.0)
MCHC: 33.4 g/dL (ref 30.0–36.0)
MCV: 94.3 fl (ref 78.0–100.0)
Monocytes Absolute: 0.6 10*3/uL (ref 0.1–1.0)
Monocytes Relative: 9.2 % (ref 3.0–12.0)
NEUTROS PCT: 55.3 % (ref 43.0–77.0)
Neutro Abs: 3.7 10*3/uL (ref 1.4–7.7)
PLATELETS: 198 10*3/uL (ref 150.0–400.0)
RBC: 4.74 Mil/uL (ref 4.22–5.81)
RDW: 13.1 % (ref 11.5–15.5)
WBC: 6.8 10*3/uL (ref 4.0–10.5)

## 2013-12-11 ENCOUNTER — Ambulatory Visit (INDEPENDENT_AMBULATORY_CARE_PROVIDER_SITE_OTHER): Payer: Managed Care, Other (non HMO) | Admitting: Family Medicine

## 2013-12-11 ENCOUNTER — Encounter: Payer: Self-pay | Admitting: Family Medicine

## 2013-12-11 VITALS — BP 132/72 | HR 82 | Temp 98.2°F | Ht 67.0 in | Wt 177.5 lb

## 2013-12-11 DIAGNOSIS — R7989 Other specified abnormal findings of blood chemistry: Secondary | ICD-10-CM

## 2013-12-11 DIAGNOSIS — R799 Abnormal finding of blood chemistry, unspecified: Secondary | ICD-10-CM

## 2013-12-11 NOTE — Progress Notes (Signed)
Subjective:    Patient ID: John Kelly, male    DOB: [birth year], 48 y.o.   MRN: [id number]  HPI The patient is here for annual wellness exam and preventative care.    Hypertension: Well controlled on metoprolol.  BP Readings from Last 3 Encounters:  12/11/13 132/72  10/22/13 148/91  09/28/13 120/64  Using medication without problems or lightheadedness: None Chest pain with exertion: No Edema:None  Short of breath:yes  Other issues:  Well controlled at work  Chest pain earlier in the year: ECHO 09/2013:  nml ETT 09/2013:  Low risk  No current exercise induced chest pain. No SOB, no fatigue.  Elevated Cholesterol: LDL almost at goal < 130 on no med. Lab Results  Component Value Date   CHOL 191 12/04/2013   HDL 40.30 12/04/2013   LDLCALC 131* 12/04/2013   LDLDIRECT 147.0 12/10/2011   TRIG 100.0 12/04/2013   CHOLHDL 5 12/04/2013    Creatinine elevated, slightly worse form in past.  No family history of kidney issues.  Drinks some fluids, not clearly adequate. No V/D Taking testosterone booster and creatine.  He uses aleve off and on couple times a week.   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 Improved with canasa and colazal.  No current GERD, on no med. Hx of esophageal ulcers.   Depression, anxiety: well controlled on prozac. No longer using ambien for sleep.    Review of Systems     Objective:   Physical Exam  Constitutional: He appears well-developed and well-nourished.  Non-toxic appearance. He does not appear ill. No distress.  HENT:  Head: Normocephalic and atraumatic.  Right Ear: Hearing, tympanic membrane, external ear and ear canal normal.  Left Ear: Hearing, tympanic membrane, external ear and ear canal normal.  Nose: Nose normal.  Mouth/Throat: Uvula is midline, oropharynx is clear and moist and mucous membranes are normal.  Eyes: Conjunctivae, EOM and lids are normal. Pupils are equal, round, and reactive to light. Lids are everted and swept, no  foreign bodies found.  Neck: Trachea normal, normal range of motion and phonation normal. Neck supple. Carotid bruit is not present. No mass and no thyromegaly present.  Cardiovascular: Normal rate, regular rhythm, S1 normal, S2 normal, intact distal pulses and normal pulses.  Exam reveals no gallop.   No murmur heard. Pulmonary/Chest: Breath sounds normal. He has no wheezes. He has no rhonchi. He has no rales.  Abdominal: Soft. Normal appearance and bowel sounds are normal. There is no hepatosplenomegaly. There is no tenderness. There is no rebound, no guarding and no CVA tenderness. No hernia.  Lymphadenopathy:    He has no cervical adenopathy.  Neurological: He is alert. He has normal strength and normal reflexes. No cranial nerve deficit or sensory deficit. Gait normal.  Skin: Skin is warm, dry and intact. No rash noted.  Psychiatric: He has a normal mood and affect. His speech is normal and behavior is normal. Judgment normal.          Assessment & Plan:  The patient's preventative maintenance and recommended screening tests for an annual wellness exam were reviewed in full today. Brought up to date unless services declined.  Counselled on the importance of diet, exercise, and its role in overall health and mortality. The patient's FH and SH was reviewed, including their home life, tobacco status, and drug and alcohol status.   Nonsmoker Colon: Colonscopy  Stable in 05/2012, per Dr. Fuller Plan, repeat in 2 years. Vaccines: Due for flu or Tdap. No  desire for STD testing. No family history of prostate cancer. Dr.Hopper recently did Genital exam (unremarkable except for left scrotal hydrocele )

## 2013-12-11 NOTE — Progress Notes (Signed)
Pre visit review using our clinic review tool, if applicable. No additional management support is needed unless otherwise documented below in the visit note. 

## 2013-12-11 NOTE — Patient Instructions (Signed)
Stop aleve, no ibuprofen, no NSAID.  Use tylenol.  Push fluids.  Hold creatine. Follow up for kidney recheck in 2 weeks.

## 2013-12-21 ENCOUNTER — Other Ambulatory Visit: Payer: Self-pay | Admitting: Internal Medicine

## 2013-12-22 NOTE — Telephone Encounter (Signed)
Last office visit 8.6.15 Med last filled 7.1.15 #30 with 2 refills

## 2013-12-22 NOTE — Telephone Encounter (Signed)
Amy, your patient I saw acutely X 1. [employer]

## 2013-12-25 ENCOUNTER — Other Ambulatory Visit (INDEPENDENT_AMBULATORY_CARE_PROVIDER_SITE_OTHER): Payer: Managed Care, Other (non HMO)

## 2013-12-25 DIAGNOSIS — R748 Abnormal levels of other serum enzymes: Secondary | ICD-10-CM

## 2013-12-25 DIAGNOSIS — R7989 Other specified abnormal findings of blood chemistry: Secondary | ICD-10-CM

## 2013-12-25 LAB — BASIC METABOLIC PANEL
BUN: 10 mg/dL (ref 6–23)
CALCIUM: 8.9 mg/dL (ref 8.4–10.5)
CO2: 29 mEq/L (ref 19–32)
Chloride: 105 mEq/L (ref 96–112)
Creatinine, Ser: 1.2 mg/dL (ref 0.4–1.5)
GFR: 67.07 mL/min (ref >60.00)
Glucose, Bld: 87 mg/dL (ref 70–99)
POTASSIUM: 4.4 meq/L (ref 3.5–5.1)
Sodium: 138 mEq/L (ref 135–145)

## 2014-01-21 ENCOUNTER — Telehealth: Payer: Self-pay | Admitting: Family Medicine

## 2014-01-21 NOTE — Telephone Encounter (Signed)
Patient Information:  Caller Name: Sabrina  Phone: [phone]  Patient: John Kelly, John Kelly  Gender: Male  DOB: 03-Sep-1965  Age: 48 Years  PCP: [facility]  Office Follow Up:  Does the office need to follow up with this patient?: Yes  Instructions For The Office: see notes  RN Note:  No appts available for today. Should pt go to UC or ER? or appt tomorrow? or any other recommendations? Assured pt someone will call him back shortly to f/u. Emergency parameters also reviewed. Agreed to plan.  Symptoms  Reason For Call & Symptoms: Having UC flare (3-4 loose stools with scant blood per day) since Sat (these are typical sxs for him with a UC flare)---just refilled his Canasa and started it last night and it usually brings his flares under control in approx 1-2 wks. 2 days ago he noticed intermittent lightheadedness when bending over and some L eye visual disturbance/partial loss of visual field/blurring when he stands back up after bending over that resolves quickly. Also having intermittent mild headaches. Confirms adequate fluid intake and urine output. Also states he is prescribed a HTN med but he does not take it and he has checked his BP recently and it has been running 120s/70s-80s.   Reviewed Health History In EMR: Yes  Reviewed Medications In EMR: Yes  Reviewed Allergies In EMR: Yes  Reviewed Surgeries / Procedures: Yes  Date of Onset of Symptoms: 01/19/2014  Guideline(s) Used:  Dizziness  Rectal Bleeding  Diarrhea  Disposition Per Guideline:   See Today in Office  Reason For Disposition Reached:   Abdominal pain (Exception: pain clears completely with each passage of diarrhea stool)  Advice Given:  Fluids:  Drink more fluids, at least 8-10 glasses (8 oz or 240 ml) daily.  Call Back If:  You become worse.  Patient Will Follow Care Advice:  YES

## 2014-01-21 NOTE — Telephone Encounter (Signed)
Sounds okay for him to be seen tommorow. Non urgent symptoms, BP well controlled already being treated for UC.  Push fluids given loss of fluids with stool, Go to ER if large blood loss or severe abdominal pain.

## 2014-01-21 NOTE — Telephone Encounter (Signed)
John Kelly notified as instructed by telephone.  He request an appointment for Saturday so he doesn't have to miss work.  Phone number given to [place] office to call Saturday morning to get scheduled in the Saturday Clinic.  If symptoms become worse, he will call us back tomorrow to get an appointment to be seen Friday with [name].

## 2014-02-04 ENCOUNTER — Other Ambulatory Visit: Payer: Self-pay | Admitting: Family Medicine

## 2014-02-04 NOTE — Telephone Encounter (Addendum)
Last office visit 12/11/2013.  Ambien not on current medication list.  Ok to refill?

## 2014-02-04 NOTE — Telephone Encounter (Signed)
Called to [facility].

## 2014-02-05 ENCOUNTER — Other Ambulatory Visit: Payer: Self-pay | Admitting: Family Medicine

## 2014-03-16 ENCOUNTER — Encounter (INDEPENDENT_AMBULATORY_CARE_PROVIDER_SITE_OTHER): Payer: Managed Care, Other (non HMO) | Admitting: Ophthalmology

## 2014-03-16 DIAGNOSIS — Z79899 Other long term (current) drug therapy: Secondary | ICD-10-CM

## 2014-03-16 DIAGNOSIS — H5340 Unspecified visual field defects: Secondary | ICD-10-CM

## 2014-03-16 DIAGNOSIS — H43813 Vitreous degeneration, bilateral: Secondary | ICD-10-CM

## 2014-03-16 DIAGNOSIS — H4602 Optic papillitis, left eye: Secondary | ICD-10-CM

## 2014-03-16 DIAGNOSIS — H2513 Age-related nuclear cataract, bilateral: Secondary | ICD-10-CM

## 2014-03-26 ENCOUNTER — Ambulatory Visit: Payer: Self-pay | Admitting: Ophthalmology

## 2014-04-12 ENCOUNTER — Ambulatory Visit: Payer: Self-pay | Admitting: Neurology

## 2014-04-12 ENCOUNTER — Encounter: Payer: Self-pay | Admitting: Family Medicine

## 2014-04-12 LAB — CSF CELL COUNT WITH DIFFERENTIAL
CSF TUBE #: 2
EOS PCT: 0 %
LYMPHS PCT: 0 %
MONOCYTES/MACROPHAGES: 0 %
Neutrophils: 0 %
OTHER CELLS: 0 %
RBC (CSF): 516 /mm3
WBC (CSF): 0 /mm3

## 2014-04-12 LAB — CBC WITH DIFFERENTIAL/PLATELET
Basophil #: 0 10*3/uL (ref 0.0–0.1)
Basophil %: 0.5 %
Eosinophil #: 0.2 10*3/uL (ref 0.0–0.7)
Eosinophil %: 3.5 %
HCT: 46 % (ref 40.0–52.0)
HGB: 15.3 g/dL (ref 13.0–18.0)
Lymphocyte #: 2 10*3/uL (ref 1.0–3.6)
Lymphocyte %: 34.6 %
MCH: 31.6 pg (ref 26.0–34.0)
MCHC: 33.2 g/dL (ref 32.0–36.0)
MCV: 95 fL (ref 80–100)
MONO ABS: 0.6 x10 3/mm (ref 0.2–1.0)
Monocyte %: 10.7 %
NEUTROS PCT: 50.7 %
Neutrophil #: 2.9 10*3/uL (ref 1.4–6.5)
PLATELETS: 158 10*3/uL (ref 150–440)
RBC: 4.83 10*6/uL (ref 4.40–5.90)
RDW: 12.9 % (ref 11.5–14.5)
WBC: 5.7 10*3/uL (ref 3.8–10.6)

## 2014-04-12 LAB — PROTIME-INR
INR: 1
PROTHROMBIN TIME: 12.6 s (ref 11.5–14.7)

## 2014-04-12 LAB — PROTEIN, CSF: PROTEIN, CSF: 74 mg/dL — AB (ref 15–45)

## 2014-04-12 LAB — GLUCOSE, CSF: Glucose, CSF: 47 mg/dL (ref 40–75)

## 2014-04-14 ENCOUNTER — Ambulatory Visit: Payer: Self-pay | Admitting: Neurology

## 2014-05-19 ENCOUNTER — Encounter: Payer: Self-pay | Admitting: Gastroenterology

## 2014-06-21 ENCOUNTER — Ambulatory Visit (INDEPENDENT_AMBULATORY_CARE_PROVIDER_SITE_OTHER): Payer: Managed Care, Other (non HMO) | Admitting: Primary Care

## 2014-06-21 ENCOUNTER — Encounter: Payer: Self-pay | Admitting: Primary Care

## 2014-06-21 VITALS — BP 146/84 | HR 87 | Temp 98.8°F | Ht 66.75 in | Wt 175.8 lb

## 2014-06-21 DIAGNOSIS — H6691 Otitis media, unspecified, right ear: Secondary | ICD-10-CM

## 2014-06-21 DIAGNOSIS — R059 Cough, unspecified: Secondary | ICD-10-CM

## 2014-06-21 DIAGNOSIS — R05 Cough: Secondary | ICD-10-CM | POA: Diagnosis not present

## 2014-06-21 MED ORDER — AMOXICILLIN 500 MG PO CAPS: 500.0000 mg | ORAL_CAPSULE | Freq: Two times a day (BID) | ORAL | Status: DC

## 2014-06-21 MED ORDER — FLUTICASONE PROPIONATE 50 MCG/ACT NA SUSP: 2.0000 | Freq: Every day | NASAL | Status: DC

## 2014-06-21 MED ORDER — BENZONATATE 100 MG PO CAPS: 100.0000 mg | ORAL_CAPSULE | Freq: Three times a day (TID) | ORAL | Status: DC | PRN

## 2014-06-21 NOTE — Progress Notes (Signed)
Subjective:    Patient ID: John Kelly, male    DOB: November 19, 1965, 49 y.o.   MRN: [id number]  HPI  John Kelly is a 49 year old male who presents today with a chief complaint of cough. His cough is productive in nature with green sputum and started yesterday. He first noticed symptoms of a right ear ache one week ago which has now progressed into nasal congestion, cough, and headaches. He's been taking ibuprofen and sudafed for his symptoms without relief. Nothing makes his symptoms better or worse. He was out in the yard for most of the day this Saturday. Denies nausea, vomiting, fevers.  Review of Systems  Constitutional: Positive for chills. Negative for fever.  HENT: Positive for ear pain, postnasal drip, sinus pressure and sneezing. Negative for sore throat.   Eyes: Positive for itching.  Respiratory: Positive for chest tightness. Negative for shortness of breath.   Cardiovascular: Negative for chest pain.  Gastrointestinal: Negative for nausea and vomiting.  Musculoskeletal: Negative for myalgias.       Past Medical History  Diagnosis Date  . Allergic rhinitis   . GERD (gastroesophageal reflux disease)   . DDD (degenerative disc disease)   . Insomnia   . Ulcerative colitis     left sided  . Abnormal liver function test   . Anxiety   . Depression   . Hypertension     no meds    History   Social History  . Marital Status: Divorced    Spouse Name: N/A  . Number of Children: 2  . Years of Education: N/A   Occupational History  . Steel distribution    Social History Main Topics  . Smoking status: Never Smoker   . Smokeless tobacco: Never Used  . Alcohol Use: 0.0 oz/week    0 drink(s) per week     Comment: rarely  . Drug Use: No  . Sexual Activity: Not Currently   Other Topics Concern  . Not on file   Social History Narrative   HSG   Divorced, not sexually active   1 son- '92, 1 daughter - '97   Work: Lobbyist- shipping/ rec'ing; quality management   Patient has never smoked   Alcohol use- rare 1-2 a month   Illicit Drug use- no    Patient gets regular exercise.    Past Surgical History  Procedure Laterality Date  . Inguinal herniorrhapy      Right and left  . Pyloroplasty      Family History  Problem Relation Age of Onset  . Coronary artery disease Mother   . Heart attack Mother   . Hyperlipidemia Mother   . Hypertension Mother   . Colitis Mother   . Diabetes Father   . Hyperlipidemia Father   . Hypertension Father   . Coronary artery disease Father   . Heart attack Father   . Coronary artery disease Brother   . Heart attack Brother 44    4 stents, smoker  . Diabetes Brother   . Hypertension Brother   . Colon cancer Neg Hx   . Esophageal cancer Neg Hx   . Stomach cancer Neg Hx   . Rectal cancer Neg Hx   . Hypertension Sister   . Transient ischemic attack Sister     Allergies  Allergen Reactions  . Diclofenac Sodium     REACTION: swelling, respiratory difficulty  . Skelaxin [Metaxalone]     REACTION: Nausea/vomiting    Current Outpatient Prescriptions on  File Prior to Visit  Medication Sig Dispense Refill  . balsalazide (COLAZAL) 750 MG capsule Take 3 capsules (2,250 mg total) by mouth 3 (three) times daily. Patient taking 3 tablets 3 times daily 810 capsule 1  . Creatine Monohydrate (CREATINE PO) Take 4 capsules by mouth daily.      Marland Kitchen FLUoxetine (PROZAC) 20 MG tablet TAKE 1 TABLET BY MOUTH EVERY DAY (Patient not taking: Reported on 06/21/2014) 30 tablet 1  . metoprolol tartrate (LOPRESSOR) 25 MG tablet Take 1 tablet (25 mg total) by mouth 2 (two) times daily. (Patient not taking: Reported on 06/21/2014) 60 tablet 2  . NON FORMULARY Testosterone booster take 2 capsule daily    . zolpidem (AMBIEN) 10 MG tablet TAKE ONE TABLET BY MOUTH AT BEDTIME AS NEEDED FOR SLEEP (Patient not taking: Reported on 06/21/2014) 30 tablet 0   No current facility-administered medications on file prior to visit.    BP 146/84 mmHg   Pulse 87  Temp(Src) 98.8 F (37.1 C) (Oral)  Ht 5' 6.75" (1.695 m)  Wt 175 lb 12.8 oz (79.742 kg)  BMI 27.76 kg/m2  SpO2 96%    Objective:   Physical Exam  Constitutional: He is oriented to person, place, and time.  HENT:  Head: Normocephalic.  Right Ear: Hearing normal. Tympanic membrane is injected, erythematous and bulging.  Left Ear: Hearing and tympanic membrane normal.  Nose: Nose normal.  Mouth/Throat: Oropharynx is clear and moist.  Eyes: Conjunctivae are normal. Pupils are equal, round, and reactive to light.  Neck: Neck supple.  Cardiovascular: Normal rate and regular rhythm.   Pulmonary/Chest: Effort normal and breath sounds normal.  Lymphadenopathy:    He has no cervical adenopathy.  Neurological: He is alert and oriented to person, place, and time.  Skin: Skin is warm and dry.  Psychiatric: He has a normal mood and affect.          Assessment & Plan:  Acute Otitis Media: Tm bulging with erythema. I suspect this is likely part of the cause for his other symptoms, as the other cause is likely due to allergies. RX for Amoxicillin for 10 days. Tessalon pearls for cough, claritin for allergy symptoms, flonase for nasal congestion. Follow up if no improvement in 3-4 days.

## 2014-06-21 NOTE — Patient Instructions (Signed)
Start Amoxicillin today. Take one tablet twice daily for 10 days until finished. Use Flonase nasal spray for congestion. Take Tessalon Pearls three times daily as needed for cough. You may take Claritin or Zyrtec daily to help with allergy symptoms. Let me know if you have no improvement or develop worsening symptoms in the next 3-4 days. Take care!

## 2014-06-21 NOTE — Progress Notes (Signed)
Pre visit review using our clinic review tool, if applicable. No additional management support is needed unless otherwise documented below in the visit note. 

## 2014-09-30 ENCOUNTER — Encounter: Payer: Self-pay | Admitting: Gastroenterology

## 2014-11-15 ENCOUNTER — Telehealth: Payer: Self-pay | Admitting: Gastroenterology

## 2014-11-15 MED ORDER — MESALAMINE 1000 MG RE SUPP: 1000.0000 mg | Freq: Every day | RECTAL | Status: DC

## 2014-11-15 NOTE — Telephone Encounter (Signed)
Refill of Canasa suppositories sent to [facility] but notified he needs office visit for further refills.

## 2014-12-15 ENCOUNTER — Encounter: Payer: Self-pay | Admitting: Internal Medicine

## 2014-12-15 ENCOUNTER — Encounter: Payer: Self-pay | Admitting: Emergency Medicine

## 2014-12-15 ENCOUNTER — Ambulatory Visit (INDEPENDENT_AMBULATORY_CARE_PROVIDER_SITE_OTHER): Payer: Managed Care, Other (non HMO) | Admitting: Internal Medicine

## 2014-12-15 ENCOUNTER — Other Ambulatory Visit (INDEPENDENT_AMBULATORY_CARE_PROVIDER_SITE_OTHER): Payer: Managed Care, Other (non HMO)

## 2014-12-15 VITALS — BP 144/90 | HR 77 | Temp 98.3°F | Resp 16 | Wt 171.0 lb

## 2014-12-15 DIAGNOSIS — G35 Multiple sclerosis: Secondary | ICD-10-CM | POA: Diagnosis not present

## 2014-12-15 DIAGNOSIS — Z23 Encounter for immunization: Secondary | ICD-10-CM

## 2014-12-15 DIAGNOSIS — N342 Other urethritis: Secondary | ICD-10-CM | POA: Diagnosis not present

## 2014-12-15 LAB — URINALYSIS
Bilirubin Urine: NEGATIVE
Hgb urine dipstick: NEGATIVE
Ketones, ur: NEGATIVE
Leukocytes, UA: NEGATIVE
NITRITE: NEGATIVE
PH: 6.5 (ref 5.0–8.0)
Specific Gravity, Urine: 1.015 (ref 1.000–1.030)
TOTAL PROTEIN, URINE-UPE24: NEGATIVE
Urine Glucose: NEGATIVE
Urobilinogen, UA: 0.2 (ref 0.0–1.0)

## 2014-12-15 MED ORDER — DOXYCYCLINE HYCLATE 100 MG PO TABS: ORAL_TABLET | ORAL | Status: DC

## 2014-12-15 NOTE — Progress Notes (Signed)
   Subjective:    Patient ID: John Kelly, male    DOB: [birth year], 49 y.o.   MRN: [id number]  HPI For the last 4-5 days he's had GU symptoms described as "irritation" inside the penis. He also noted frequency today. He has no other associated genitourinary or dermatologic symptoms.  His girlfriend has been diagnosed with HPV. Apparently she had a definitive procedure by her Gynecologist will be seen in follow up in 2 weeks.  He was diagnosed with Multiple Sclerosis at [facility]. He has declined two therapeutic options recommended to him. He wants to pursue "natural" interventions.  That evaluation included a positive HSV serology  Review of Systems Dysuria, pyuria, hematuria, nocturia or polyuria are denied. Despite his concerns for natural interventions; he requested a prescription for zolpidem for insomnia.    Objective:   Physical Exam Pertinent or positive findings include: He has a full beard and mustache. Small granuloma is noted in the right epididymal area. Varices are noted on the left. No penile lesions or drainage are noted.  General appearance :adequately nourished; in no distress.  Eyes: No conjunctival inflammation or scleral icterus is present.  Oral exam:  Lips and gums are healthy appearing.There is no oropharyngeal erythema or exudate noted. Dental hygiene is good.  Heart:  Normal rate and regular rhythm. S1 and S2 normal without gallop, murmur, click, rub or other extra sounds    Lungs:Chest clear to auscultation; no wheezes, rhonchi,rales ,or rubs present.No increased work of breathing.   Abdomen: bowel sounds normal, soft and non-tender without masses, organomegaly or hernias noted.  No guarding or rebound. No flank tenderness to percussion.  Vascular : all pulses equal ; no bruits present.  Skin:Warm & dry.  Intact without suspicious lesions or rashes ; no tenting or jaundice   Lymphatic: No lymphadenopathy is noted about the head, neck, axilla, or inguinal  areas.   Neuro: Strength, tone  normal.     Assessment & Plan:  #1 urethritis, probably nongonococcal  #2 history of positive HSV   #3 multiple sclerosis, untreated  #4 sleep disorder  Plan: See orders and recommendations

## 2014-12-15 NOTE — Progress Notes (Signed)
Pre visit review using our clinic review tool, if applicable. No additional management support is needed unless otherwise documented below in the visit note. 

## 2014-12-15 NOTE — Patient Instructions (Addendum)
Drink as much nondairy fluids as possible. Avoid spicy foods or alcohol as  these may aggravate the bladder and urethra. Do not take decongestants. Avoid narcotics if possible.  If the urine culture is negative or does not reveal specific organism; doxycycline twice a day for 7 days would be recommended. You have to avoid direct sun if you took that medication.  To prevent sleep dysfunction follow these instructions for sleep hygiene. Do not read, watch TV, or eat in bed. Do not get into bed until you are ready to turn off the light &  to go to sleep. Do not ingest stimulants ( decongestants, diet pills, nicotine, caffeine) after the evening meal.Do not take daytime naps.Cardiovascular exercise, this can be as simple a program as walking, is recommended 30-45 minutes 3-4 times per week. If you're not exercising you should take 6-8 weeks to build up to this level.

## 2014-12-16 LAB — URINE CULTURE
Colony Count: NO GROWTH
Organism ID, Bacteria: NO GROWTH

## 2014-12-29 ENCOUNTER — Telehealth: Payer: Self-pay | Admitting: Gastroenterology

## 2014-12-29 MED ORDER — BALSALAZIDE DISODIUM 750 MG PO CAPS: 2250.0000 mg | ORAL_CAPSULE | Freq: Three times a day (TID) | ORAL | Status: DC

## 2014-12-29 MED ORDER — MESALAMINE 1000 MG RE SUPP: 1000.0000 mg | Freq: Two times a day (BID) | RECTAL | Status: DC

## 2014-12-29 NOTE — Telephone Encounter (Signed)
Patient reports that he is having diarrhea, abdominal pain,  and bleeding for 1 month.  He has been using canasa nightly for about a month.  It was improved the symptoms slightly, but he is now out.  Dr. Fuller Plan I don't have any APP appts this or next week.  Please advise

## 2014-12-29 NOTE — Telephone Encounter (Signed)
Patient notified of all the recommendations He is scheduled for follow up on 01/17/15 3:00.  He is advised that will send a one month supply of both to his local pharmacy.

## 2014-12-29 NOTE — Telephone Encounter (Signed)
Continue Colazal 750 mg 3 po tid.  Continue Canasa suppositories twice a day. He is overdue for REV so he must set an appt with me or APP and please provide only enough medication refills to get him to his appt

## 2014-12-30 NOTE — Telephone Encounter (Signed)
Encounter complete. 

## 2015-01-01 ENCOUNTER — Other Ambulatory Visit: Payer: Self-pay | Admitting: Family Medicine

## 2015-01-01 NOTE — Telephone Encounter (Signed)
Last office visit 09/282016 with Dr. Linna Darner at [facility] Last refilled 02/04/2014 for #30 with no refills.

## 2015-01-04 NOTE — Telephone Encounter (Signed)
Okay to refill once appt for CPX with labs prior made

## 2015-01-04 NOTE — Telephone Encounter (Signed)
Please call and schedule CPE with fasting labs for Dr. Diona Kelly.  Unable to refill his Ambien until he makes an appointment.  Please route back to me once he has scheduled and I can refill once.

## 2015-01-05 ENCOUNTER — Encounter: Payer: Self-pay | Admitting: *Deleted

## 2015-01-05 NOTE — Telephone Encounter (Signed)
Sent patient a MyChart message asking him to call the office and schedule CPE with [name].

## 2015-01-06 MED ORDER — ZOLPIDEM TARTRATE 10 MG PO TABS: 10.0000 mg | ORAL_TABLET | Freq: Every evening | ORAL | Status: DC | PRN

## 2015-01-06 NOTE — Telephone Encounter (Signed)
Spoke with pt.  He stated he would be unable to make appointment at this time.  He was diagnosed with MS and has been seeing several dr

## 2015-01-06 NOTE — Telephone Encounter (Signed)
Since he was seen in 06/2014 by Allie Bossier... I will refill ambien 1 more time. If needs further refills after that but cannot make appt here for CPX... Needs to get Ambien from neurologist he is seeing regularly for MS.

## 2015-01-06 NOTE — Telephone Encounter (Signed)
Pt left voicemail requesting status of refill, I called pt back and advise him that he needs to schedule a CPE with labs prior before we can refill med. Pt said he can't do that right now and declined to schedule any appts. Pt said he was recently diagnosed with MS at [facility] and has had a lot of appts. and can't get off work anymore to have a physical appt but still wanted me to send the message back to [name], and request that she refill meds, I advise pt I will send the message back to [name] and her assistant will call him back

## 2015-01-06 NOTE — Telephone Encounter (Signed)
Zolpidem called into [facility]

## 2015-01-06 NOTE — Telephone Encounter (Signed)
Mr. Sedler notified as instructed by telephone.

## 2015-01-17 ENCOUNTER — Encounter: Payer: Self-pay | Admitting: Gastroenterology

## 2015-01-17 ENCOUNTER — Ambulatory Visit (INDEPENDENT_AMBULATORY_CARE_PROVIDER_SITE_OTHER): Payer: Managed Care, Other (non HMO) | Admitting: Gastroenterology

## 2015-01-17 VITALS — BP 152/84 | HR 76 | Ht 66.75 in | Wt 171.2 lb

## 2015-01-17 DIAGNOSIS — K51319 Ulcerative (chronic) rectosigmoiditis with unspecified complications: Secondary | ICD-10-CM | POA: Diagnosis not present

## 2015-01-17 MED ORDER — BALSALAZIDE DISODIUM 750 MG PO CAPS: 2250.0000 mg | ORAL_CAPSULE | Freq: Three times a day (TID) | ORAL | Status: DC

## 2015-01-17 MED ORDER — MESALAMINE 1000 MG RE SUPP: 1000.0000 mg | Freq: Every day | RECTAL | Status: DC

## 2015-01-17 NOTE — Patient Instructions (Signed)
We have sent the following medications to your pharmacy for you to pick up at your convenience:Colozal and Canasa suppositories for you to continue x 1 week daily then stop.  Thank you for choosing me and [facility].  Pricilla Riffle. Dagoberto Ligas., MD., Marval Regal

## 2015-01-17 NOTE — Assessment & Plan Note (Signed)
Recent symptoms flare has resolved. Continue Colazal 2.25 g 3 times a day. Decrease Canasa to daily for 7 days and then discontinue. Surveillance colonoscopy recommended March 2017.

## 2015-01-17 NOTE — Progress Notes (Signed)
    History of Present Illness: This is a 50 year old male returning for follow-up of ulcerative colitis. He is overdue for his annual visit. He relates a recent flare of symptoms a few weeks ago with increase in stool frequency with occasional bleeding. Begin Canasa suppositories the symptoms haven't resolved. He has 2 formed bowel movements per day. He states he was recently diagnosed with MS and has been evaluated and [place]. He is unsure about which medications he'll be taking. He states he may have neurology follow-up at [facility] instead of [facility]. Blood work from [facility] in August reviewed.  Current Medications, Allergies, Past Medical History, Past Surgical History, Family History and Social History were reviewed in [employer] record.  Physical Exam: General: Well developed, well nourished, no acute distress Head: Normocephalic and atraumatic Eyes:  sclerae anicteric, EOMI Ears: Normal auditory acuity Mouth: No deformity or lesions Lungs: Clear throughout to auscultation Heart: Regular rate and rhythm; no murmurs, rubs or bruits Abdomen: Soft, non tender and non distended. No masses, hepatosplenomegaly or hernias noted. Normal Bowel sounds Musculoskeletal: Symmetrical with no gross deformities  Pulses:  Normal pulses noted Extremities: No clubbing, cyanosis, edema or deformities noted Neurological: Alert oriented x 4, grossly nonfocal Psychological:  Alert and cooperative. Normal mood and affect  Assessment and Recommendations:  1. Ulcerative proctosigmoidlitis. Recent symptoms flare has resolved. Continue Colazal 2.25 g 3 times a day. Decrease Canasa to daily for 7 days and then discontinue. Surveillance colonoscopy recommended March 2017.  2. Mildly increased Cr. He is advised to follow up with PCP.   3. MS follow up with Neurology as planned.

## 2015-03-07 ENCOUNTER — Ambulatory Visit (INDEPENDENT_AMBULATORY_CARE_PROVIDER_SITE_OTHER): Payer: Managed Care, Other (non HMO) | Admitting: Ophthalmology

## 2015-03-07 DIAGNOSIS — H43813 Vitreous degeneration, bilateral: Secondary | ICD-10-CM

## 2015-03-07 DIAGNOSIS — H47212 Primary optic atrophy, left eye: Secondary | ICD-10-CM

## 2015-03-07 DIAGNOSIS — H5340 Unspecified visual field defects: Secondary | ICD-10-CM

## 2015-03-07 DIAGNOSIS — H31002 Unspecified chorioretinal scars, left eye: Secondary | ICD-10-CM | POA: Diagnosis not present

## 2015-04-27 ENCOUNTER — Encounter: Payer: Self-pay | Admitting: Gastroenterology

## 2015-07-26 ENCOUNTER — Encounter: Payer: Self-pay | Admitting: Gastroenterology

## 2015-07-26 ENCOUNTER — Telehealth: Payer: Self-pay | Admitting: Gastroenterology

## 2015-07-26 MED ORDER — MESALAMINE 1000 MG RE SUPP: 1000.0000 mg | Freq: Every day | RECTAL | Status: DC

## 2015-07-26 NOTE — Telephone Encounter (Signed)
Prescription sent to patients pharmacy and patient notified.

## 2015-07-27 ENCOUNTER — Telehealth: Payer: Self-pay | Admitting: Gastroenterology

## 2015-07-27 MED ORDER — MESALAMINE 1000 MG RE SUPP: 1000.0000 mg | Freq: Every day | RECTAL | Status: DC

## 2015-07-27 NOTE — Telephone Encounter (Signed)
Informed patient that I sent # 30 to the pharmacy instead and apologized for sending a 7 day supply. Informed him that it was sent in error. He had only 7 days sent in on his last refill and that is why I refilled it off of last prescription. Pt verbalized understanding and states that it's ok.

## 2015-08-01 ENCOUNTER — Telehealth: Payer: Self-pay | Admitting: Gastroenterology

## 2015-08-01 MED ORDER — MESALAMINE 1000 MG RE SUPP: 1000.0000 mg | Freq: Two times a day (BID) | RECTAL | Status: DC

## 2015-08-01 NOTE — Telephone Encounter (Signed)
Patient states the pharmacy had to order the prescription and the prescription was sent in for once daily and it's supposed to be twice a day. Apologized and sent the prescription for twice daily. Also called [facility] on [address] to make sure they order enough for patient to have it twice a day. The pharmacy states they will have to place another order but will have it ready for the patient tomorrow and will contact him.

## 2015-09-15 ENCOUNTER — Ambulatory Visit (AMBULATORY_SURGERY_CENTER): Payer: Self-pay | Admitting: *Deleted

## 2015-09-15 VITALS — Ht 67.0 in | Wt 176.2 lb

## 2015-09-15 DIAGNOSIS — K51319 Ulcerative (chronic) rectosigmoiditis with unspecified complications: Secondary | ICD-10-CM

## 2015-09-15 MED ORDER — NA SULFATE-K SULFATE-MG SULF 17.5-3.13-1.6 GM/177ML PO SOLN: ORAL | Status: DC

## 2015-09-15 NOTE — Progress Notes (Signed)
No allergies to eggs or soy. No problems with anesthesia.  Pt not given Emmi instructions for colonoscopy; pt not interested  No oxygen use  No diet drug use

## 2015-09-21 ENCOUNTER — Encounter: Payer: Self-pay | Admitting: Gastroenterology

## 2015-09-29 ENCOUNTER — Ambulatory Visit (AMBULATORY_SURGERY_CENTER): Payer: Managed Care, Other (non HMO) | Admitting: Gastroenterology

## 2015-09-29 ENCOUNTER — Encounter: Payer: Self-pay | Admitting: Gastroenterology

## 2015-09-29 VITALS — BP 113/64 | HR 78 | Temp 98.9°F | Resp 18 | Ht 67.0 in | Wt 176.0 lb

## 2015-09-29 DIAGNOSIS — K51319 Ulcerative (chronic) rectosigmoiditis with unspecified complications: Secondary | ICD-10-CM

## 2015-09-29 DIAGNOSIS — Z1211 Encounter for screening for malignant neoplasm of colon: Secondary | ICD-10-CM

## 2015-09-29 MED ORDER — SODIUM CHLORIDE 0.9 % IV SOLN: 500.0000 mL | INTRAVENOUS | Status: DC

## 2015-09-29 NOTE — Op Note (Signed)
[place] Patient Name: John Kelly Procedure Date: 09/29/2015 10:01 AM MRN: [id number] Endoscopist: Ladene Artist , MD Age: 50 Referring MD:  Date of Birth: Nov 19, 1965 Gender: Male Account #: [id number] Procedure:                Colonoscopy Indications:              High risk colon cancer surveillance: Ulcerative                            left sided colitis of 15 (or more) years duration Medicines:                Monitored Anesthesia Care Procedure:                Pre-Anesthesia Assessment:                           - Prior to the procedure, a History and Physical                            was performed, and patient medications and                            allergies were reviewed. The patient's tolerance of                            previous anesthesia was also reviewed. The risks                            and benefits of the procedure and the sedation                            options and risks were discussed with the patient.                            All questions were answered, and informed consent                            was obtained. Prior Anticoagulants: The patient has                            taken no previous anticoagulant or antiplatelet                            agents. ASA Grade Assessment: II - A patient with                            mild systemic disease. After reviewing the risks                            and benefits, the patient was deemed in                            satisfactory condition to undergo the procedure.  After obtaining informed consent, the colonoscope                            was passed under direct vision. Throughout the                            procedure, the patient's blood pressure, pulse, and                            oxygen saturations were monitored continuously. The                            Model PCF-H190L [id number]) scope was introduced                            through the  anus and advanced to the the cecum,                            identified by appendiceal orifice and ileocecal                            valve. The colonoscopy was performed without                            difficulty. The patient tolerated the procedure                            well. The quality of the bowel preparation was                            good. The ileocecal valve, appendiceal orifice, and                            rectum were photographed. Scope In: 10:12:00 AM Scope Out: 10:24:40 AM Scope Withdrawal Time: 0 hours 11 minutes 4 seconds  Total Procedure Duration: 0 hours 12 minutes 40 seconds  Findings:                 The digital rectal exam was normal.                           Inflammation characterized by granularity was found                            in the rectum and the sigmoid colon. This was mild                            in severity, and when compared to previous                            examinations, the findings are improved. Biopsies                            were taken with a cold forceps for histology.  The exam was otherwise normal throughout the                            examined colon. Random biopsies were obtained.                           The retroflexed view of the distal rectum and anal                            verge showed no other anal or rectal abnormalities. Complications:            No immediate complications. Estimated Blood Loss:     Estimated blood loss: none. 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                            The findings are improved compared to previous                            examinations. Biopsied.                           - Otherwise normal colonoscopy Recommendation:           - Patient has a contact number available for                            emergencies. The signs and symptoms of potential                            delayed  complications were discussed with the                            patient. Return to normal activities tomorrow.                            Written discharge instructions were provided to the                            patient.                           - Resume previous diet.                           - Continue present medications.                           - Await pathology results.                           - Repeat colonoscopy in 3 years for surveillance. Ladene Artist, MD 09/29/2015 10:35:13 AM This report has been signed electronically.

## 2015-09-29 NOTE — Patient Instructions (Signed)
Impression/recommendations:  Proctosigmoid colitis (handout given)  Repeat colonoscopy in 3 years.  YOU HAD AN ENDOSCOPIC PROCEDURE TODAY AT [facility]:   Refer to the procedure report that was given to you for any specific questions about what was found during the examination.  If the procedure report does not answer your questions, please call your gastroenterologist to clarify.  If you requested that your care partner not be given the details of your procedure findings, then the procedure report has been included in a sealed envelope for you to review at your convenience later.  YOU SHOULD EXPECT: Some feelings of bloating in the abdomen. Passage of more gas than usual.  Walking can help get rid of the air that was put into your GI tract during the procedure and reduce the bloating. If you had a lower endoscopy (such as a colonoscopy or flexible sigmoidoscopy) you may notice spotting of blood in your stool or on the toilet paper. If you underwent a bowel prep for your procedure, you may not have a normal bowel movement for a few days.  Please Note:  You might notice some irritation and congestion in your nose or some drainage.  This is from the oxygen used during your procedure.  There is no need for concern and it should clear up in a day or so.  SYMPTOMS TO REPORT IMMEDIATELY:   Following lower endoscopy (colonoscopy or flexible sigmoidoscopy):  Excessive amounts of blood in the stool  Significant tenderness or worsening of abdominal pains  Swelling of the abdomen that is new, acute  Fever of 100F or higher  For urgent or emergent issues, a gastroenterologist can be reached at any hour by calling [phone].   DIET: Your first meal following the procedure should be a small meal and then it is ok to progress to your normal diet. Heavy or fried foods are harder to digest and may make you feel nauseous or bloated.  Likewise, meals heavy in dairy and vegetables can  increase bloating.  Drink plenty of fluids but you should avoid alcoholic beverages for 24 hours.  ACTIVITY:  You should plan to take it easy for the rest of today and you should NOT DRIVE or use heavy machinery until tomorrow (because of the sedation medicines used during the test).    FOLLOW UP: Our staff will call the number listed on your records the next business day following your procedure to check on you and address any questions or concerns that you may have regarding the information given to you following your procedure. If we do not reach you, we will leave a message.  However, if you are feeling well and you are not experiencing any problems, there is no need to return our call.  We will assume that you have returned to your regular daily activities without incident.  If any biopsies were taken you will be contacted by phone or by letter within the next 1-3 weeks.  Please call us at [phone] if you have not heard about the biopsies in 3 weeks.    SIGNATURES/CONFIDENTIALITY: You and/or your care partner have signed paperwork which will be entered into your electronic medical record.  These signatures attest to the fact that that the information above on your After Visit Summary has been reviewed and is understood.  Full responsibility of the confidentiality of this discharge information lies with you and/or your care-partner.

## 2015-09-29 NOTE — Progress Notes (Signed)
To PACU Pt awake and alert report to rn

## 2015-09-29 NOTE — Progress Notes (Signed)
Called to room to assist during endoscopic procedure.  Patient ID and intended procedure confirmed with present staff. Received instructions for my participation in the procedure from the performing physician.  

## 2015-09-30 ENCOUNTER — Telehealth: Payer: Self-pay | Admitting: *Deleted

## 2015-09-30 NOTE — Telephone Encounter (Signed)
  Follow up Call-  Call back number 09/29/2015  Post procedure Call Back phone  # [phone]  Permission to leave phone message Yes     Patient questions:  Do you have a fever, pain , or abdominal swelling? No. Pain Score  0 *  Have you tolerated food without any problems? Yes.    Have you been able to return to your normal activities? Yes.    Do you have any questions about your discharge instructions: Diet   No. Medications  No. Follow up visit  No.  Do you have questions or concerns about your Care? No.  Actions: * If pain score is 4 or above: No action needed, pain <4.

## 2015-10-05 ENCOUNTER — Encounter: Payer: Self-pay | Admitting: Gastroenterology

## 2016-01-19 ENCOUNTER — Telehealth: Payer: Self-pay | Admitting: Gastroenterology

## 2016-01-19 MED ORDER — MESALAMINE 1000 MG RE SUPP: 1000.0000 mg | Freq: Two times a day (BID) | RECTAL | 1 refills | Status: DC

## 2016-01-19 NOTE — Telephone Encounter (Signed)
Informed patient that they are supposed to be filling the Canasa with twice daily dosing. Patient states he is not sure why they haven't been. I informed patient that I will send it in again for twice a day. Patient verbalized understanding.

## 2016-03-30 ENCOUNTER — Encounter: Payer: Self-pay | Admitting: Family Medicine

## 2016-03-30 ENCOUNTER — Ambulatory Visit (INDEPENDENT_AMBULATORY_CARE_PROVIDER_SITE_OTHER): Payer: Managed Care, Other (non HMO) | Admitting: Family Medicine

## 2016-03-30 VITALS — BP 158/80 | HR 82 | Temp 98.5°F | Ht 67.0 in | Wt 180.5 lb

## 2016-03-30 DIAGNOSIS — I10 Essential (primary) hypertension: Secondary | ICD-10-CM | POA: Diagnosis not present

## 2016-03-30 DIAGNOSIS — F5104 Psychophysiologic insomnia: Secondary | ICD-10-CM

## 2016-03-30 MED ORDER — TRAZODONE HCL 50 MG PO TABS: 50.0000 mg | ORAL_TABLET | Freq: Every evening | ORAL | 0 refills | Status: DC | PRN

## 2016-03-30 MED ORDER — LOSARTAN POTASSIUM-HCTZ 50-12.5 MG PO TABS: 1.0000 | ORAL_TABLET | Freq: Every day | ORAL | 11 refills | Status: DC

## 2016-03-30 NOTE — Progress Notes (Signed)
   Subjective:    Patient ID: John Kelly, male    DOB: 1965/06/11, 51 y.o.   MRN: [id number]  HPI  51 year old male presents for follow up of blood pressure.   I have not see him since 2015 .Marland Kitchen At that time HTN was managed by metoprolol. He had no side effect.. Ran out and BP was okay until now.  His BP has been elevated  At neurologist.  BP was 187/91. He has been following at home.. BP is running 160/90-183/90.  Has been having headache, lightheaded, no vision change. Very tired feeling. BP Readings from Last 3 Encounters:  03/30/16 (!) 158/80  09/29/15 113/64  01/17/15 (!) 152/84   Not eating much salt. Wt Readings from Last 3 Encounters:  03/30/16 180 lb 8 oz (81.9 kg)  09/29/15 176 lb (79.8 kg)  09/15/15 176 lb 3.2 oz (79.9 kg)   Due for CPX with labs prior.  Did have labs 03/07/2016 at [facility]  thyroid nml, GRF 64, cr 1.2 stable for him, nml liver function,  Glucose nml at 95, hg nml at 16,  He has been treated by neurology at [facility] by  Dr. Manuella Ghazi in past few years for  Multiple sclerosis, B12 def and vit D def.   He is also followed by GI, Dr. Fuller Plan for ulcerative colitis.. On canasa.  Vitals:   03/30/16 1419  BP: (!) 158/80  Pulse: 82  Temp: 98.5 F (36.9 C)      In last few months  He has been having worsening insomnia... Neuro prescribed  Remeron, 1 tab daily... Does not help at all.    Review of Systems  Constitutional: Positive for fatigue. Negative for fever.  Respiratory: Negative for shortness of breath.   Cardiovascular: Positive for chest pain. Negative for palpitations and leg swelling.  Gastrointestinal: Negative for abdominal pain.       Objective:   Physical Exam  Constitutional: Vital signs are normal. He appears well-developed and well-nourished.  HENT:  Head: Normocephalic.  Right Ear: Hearing normal.  Left Ear: Hearing normal.  Nose: Nose normal.  Mouth/Throat: Oropharynx is clear and moist and mucous membranes are  normal.  Neck: Trachea normal. Carotid bruit is not present. No thyroid mass and no thyromegaly present.  Cardiovascular: Normal rate, regular rhythm and normal pulses.  Exam reveals no gallop, no distant heart sounds and no friction rub.   No murmur heard. No peripheral edema  Pulmonary/Chest: Effort normal and breath sounds normal. No respiratory distress.  Skin: Skin is warm, dry and intact. No rash noted.  Psychiatric: He has a normal mood and affect. His speech is normal and behavior is normal. Thought content normal.          Assessment & Plan:

## 2016-03-30 NOTE — Assessment & Plan Note (Signed)
Nml recent labs. Likely due to family history. Eval next labs for risk factors like chol panel. Start losartan HCTZ daily. Follow up with Cr in 2 weeks. Encouraged exercise, weight loss, healthy eating habits.

## 2016-03-30 NOTE — Assessment & Plan Note (Signed)
Remeron not effective.. Contributing to weight gain.. Stop. Trial of trazodone.

## 2016-03-30 NOTE — Patient Instructions (Addendum)
Start  losartan/HCTZ  1 tab daily.  Follow blood pressure at home, Goal < 140/90.  Stop remeron as not effective.  Start trazodone at bedtime 50 mg daily prn.

## 2016-04-03 ENCOUNTER — Telehealth: Payer: Self-pay | Admitting: Family Medicine

## 2016-04-03 DIAGNOSIS — Z13220 Encounter for screening for lipoid disorders: Secondary | ICD-10-CM

## 2016-04-03 DIAGNOSIS — Z125 Encounter for screening for malignant neoplasm of prostate: Secondary | ICD-10-CM

## 2016-04-03 NOTE — Telephone Encounter (Signed)
-----   Message from Ellamae Sia sent at 03/30/2016  3:20 PM EST ----- Regarding: Lab orders for about two week Lab orders for about 2 weeks, not sure when he is coming in, Patient is scheduled for CPX labs, please order future labs, Thanks , Karna Christmas

## 2016-04-10 ENCOUNTER — Other Ambulatory Visit (INDEPENDENT_AMBULATORY_CARE_PROVIDER_SITE_OTHER): Payer: Managed Care, Other (non HMO)

## 2016-04-10 DIAGNOSIS — Z125 Encounter for screening for malignant neoplasm of prostate: Secondary | ICD-10-CM

## 2016-04-10 DIAGNOSIS — Z13220 Encounter for screening for lipoid disorders: Secondary | ICD-10-CM

## 2016-04-10 LAB — COMPREHENSIVE METABOLIC PANEL
ALBUMIN: 4.4 g/dL (ref 3.5–5.2)
ALK PHOS: 67 U/L (ref 39–117)
ALT: 25 U/L (ref 0–53)
AST: 23 U/L (ref 0–37)
BUN: 13 mg/dL (ref 6–23)
CALCIUM: 9.4 mg/dL (ref 8.4–10.5)
CHLORIDE: 103 meq/L (ref 96–112)
CO2: 32 mEq/L (ref 19–32)
CREATININE: 1.31 mg/dL (ref 0.40–1.50)
GFR: 61.21 mL/min (ref >60.00)
Glucose, Bld: 96 mg/dL (ref 70–99)
Potassium: 4.4 mEq/L (ref 3.5–5.1)
SODIUM: 139 meq/L (ref 135–145)
TOTAL PROTEIN: 6.7 g/dL (ref 6.0–8.3)
Total Bilirubin: 1.1 mg/dL (ref 0.2–1.2)

## 2016-04-10 LAB — LIPID PANEL
CHOL/HDL RATIO: 4
CHOLESTEROL: 194 mg/dL (ref 0–200)
HDL: 47.7 mg/dL (ref >39.00)
LDL CALC: 130 mg/dL — AB (ref 0–99)
NONHDL: 145.89
Triglycerides: 79 mg/dL (ref 0.0–149.0)
VLDL: 15.8 mg/dL (ref 0.0–40.0)

## 2016-04-10 LAB — PSA: PSA: 0.81 ng/mL (ref 0.10–4.00)

## 2016-04-13 ENCOUNTER — Ambulatory Visit (INDEPENDENT_AMBULATORY_CARE_PROVIDER_SITE_OTHER): Payer: Managed Care, Other (non HMO) | Admitting: Family Medicine

## 2016-04-13 ENCOUNTER — Encounter: Payer: Self-pay | Admitting: Family Medicine

## 2016-04-13 VITALS — BP 124/82 | HR 79 | Temp 98.3°F | Ht 67.0 in | Wt 178.5 lb

## 2016-04-13 DIAGNOSIS — K51319 Ulcerative (chronic) rectosigmoiditis with unspecified complications: Secondary | ICD-10-CM

## 2016-04-13 DIAGNOSIS — Z0000 Encounter for general adult medical examination without abnormal findings: Secondary | ICD-10-CM

## 2016-04-13 DIAGNOSIS — F5104 Psychophysiologic insomnia: Secondary | ICD-10-CM

## 2016-04-13 DIAGNOSIS — I10 Essential (primary) hypertension: Secondary | ICD-10-CM | POA: Diagnosis not present

## 2016-04-13 DIAGNOSIS — K429 Umbilical hernia without obstruction or gangrene: Secondary | ICD-10-CM

## 2016-04-13 DIAGNOSIS — Z8249 Family history of ischemic heart disease and other diseases of the circulatory system: Secondary | ICD-10-CM

## 2016-04-13 DIAGNOSIS — G35 Multiple sclerosis: Secondary | ICD-10-CM

## 2016-04-13 NOTE — Addendum Note (Signed)
Addended by: Marchia Bond on: 04/13/2016 03:57 PM   Modules accepted: Orders

## 2016-04-13 NOTE — Patient Instructions (Addendum)
Stop at lab on way out.   Start baby aspirin if tolerated. Work on low cholesterol diet.  Keep up with regular exercise.

## 2016-04-13 NOTE — Progress Notes (Signed)
   Subjective:    Patient ID: John Kelly, male    DOB: Feb 08, 1966, 51 y.o.   MRN: [id number]  HPI The patient is here for annual wellness exam and preventative care.    Hypertension:    At last OV started losartan HCTZ daily    BP Readings from Last 3 Encounters:  04/13/16 124/82  03/30/16 (!) 158/80  09/29/15 113/64   Need follow up cr today.  Using medication without problems or lightheadedness: none Chest pain with exertion: none Edema:None Short of breath: none Average home BPs:130/70s Other issues:   Cholesterol improved but not at goal.  AHA risk 10 year risk: 4.3%  No clear indication for stain.  Brother with MI age 54  Lab Results  Component Value Date   CHOL 194 04/10/2016   HDL 47.70 04/10/2016   LDLCALC 130 (H) 04/10/2016   LDLDIRECT 147.0 12/10/2011   TRIG 79.0 04/10/2016   CHOLHDL 4 04/10/2016   Diet:moderate, trying to improve  Exercise: 5 days a week  Chronic insomonia: trial of trazodone. He has had significant improvement with this med. Minimal side effects.  Social History /Family History/Past Medical History reviewed and updated if needed.  Review of Systems  Constitutional: Negative for fatigue and fever.  HENT: Negative for ear pain.   Eyes: Negative for pain.  Respiratory: Negative for cough and shortness of breath.   Cardiovascular: Negative for chest pain, palpitations and leg swelling.  Gastrointestinal: Negative for abdominal pain.  Genitourinary: Negative for dysuria.  Musculoskeletal: Negative for arthralgias.  Neurological: Negative for syncope, light-headedness and headaches.  Psychiatric/Behavioral: Negative for dysphoric mood.       Objective:   Physical Exam  Constitutional: Vital signs are normal. He appears well-developed and well-nourished.  HENT:  Head: Normocephalic.  Right Ear: Hearing normal.  Left Ear: Hearing normal.  Nose: Nose normal.  Mouth/Throat: Oropharynx is clear and moist and mucous membranes are  normal.  Neck: Trachea normal. Carotid bruit is not present. No thyroid mass and no thyromegaly present.  Cardiovascular: Normal rate, regular rhythm and normal pulses.  Exam reveals no gallop, no distant heart sounds and no friction rub.   No murmur heard. No peripheral edema  Pulmonary/Chest: Effort normal and breath sounds normal. No respiratory distress.  Abdominal: There is no hepatosplenomegaly. There is no tenderness. There is no CVA tenderness. A hernia is present.  Small umbilical hernia, mildly tender, no redness,  Skin: Skin is warm, dry and intact. No rash noted.  Psychiatric: He has a normal mood and affect. His speech is normal and behavior is normal. Thought content normal.          Assessment & Plan:  The patient's preventative maintenance and recommended screening tests for an annual wellness exam were reviewed in full today. Brought up to date unless services declined.  Counselled on the importance of diet, exercise, and its role in overall health and mortality. The patient's FH and SH was reviewed, including their home life, tobacco status, and drug and alcohol status.    Vaccines:  Refused flu, uptodate with tdap Prostate Cancer Screen:  Lab Results  Component Value Date   PSA 0.81 04/10/2016   PSA 0.66 10/22/2013  Colon Cancer Screen:  09/2015 q 5 years with UC      Smoking Status: none ETOH/ drug use: occ/none   HIV screen:    refused

## 2016-04-13 NOTE — Progress Notes (Signed)
Pre visit review using our clinic review tool, if applicable. No additional management support is needed unless otherwise documented below in the visit note. 

## 2016-04-13 NOTE — Assessment & Plan Note (Signed)
No clear sign of incarceration. In past has resolved on its own. Pt counseled to got to ER if severe pain or redness or irreducible. If not improving follow up for [employer] referral.

## 2016-04-13 NOTE — Assessment & Plan Note (Signed)
IMproved significantly with trazodone.

## 2016-04-13 NOTE — Assessment & Plan Note (Addendum)
Followed by GI Sr. Fuller Plan.  No current symptoms on canasa.

## 2016-04-13 NOTE — Assessment & Plan Note (Signed)
Now at goal on losartan HCTZ. Check Cr today. Encouraged exercise, weight loss, healthy eating habits.

## 2016-04-13 NOTE — Assessment & Plan Note (Signed)
Consider baby aspirin.

## 2016-04-13 NOTE — Assessment & Plan Note (Addendum)
Followed at [facility].  Stable symptoms on current regimen.

## 2016-04-14 LAB — BASIC METABOLIC PANEL
BUN: 15 mg/dL (ref 7–25)
CALCIUM: 9.5 mg/dL (ref 8.6–10.3)
CO2: 26 mmol/L (ref 20–31)
Chloride: 103 mmol/L (ref 98–110)
Creat: 1.25 mg/dL (ref 0.70–1.33)
GLUCOSE: 79 mg/dL (ref 65–99)
Potassium: 4.4 mmol/L (ref 3.5–5.3)
Sodium: 139 mmol/L (ref 135–146)

## 2016-05-25 ENCOUNTER — Telehealth: Payer: Self-pay

## 2016-05-25 NOTE — Telephone Encounter (Signed)
PA was approved for Canasa suppositories through [place]. Ref # [id number], auth approved 05/25/16-05/25/17

## 2016-06-04 ENCOUNTER — Other Ambulatory Visit: Payer: Self-pay | Admitting: Family Medicine

## 2016-09-01 ENCOUNTER — Emergency Department (HOSPITAL_COMMUNITY): Payer: Managed Care, Other (non HMO)

## 2016-09-01 ENCOUNTER — Encounter (HOSPITAL_COMMUNITY): Payer: Self-pay | Admitting: Emergency Medicine

## 2016-09-01 ENCOUNTER — Other Ambulatory Visit: Payer: Self-pay

## 2016-09-01 ENCOUNTER — Inpatient Hospital Stay (HOSPITAL_COMMUNITY)
Admission: EM | Admit: 2016-09-01 | Discharge: 2016-09-03 | DRG: 282 | Disposition: A | Discharge status: Home / Self Care | Payer: Managed Care, Other (non HMO) | Attending: Cardiovascular Disease | Admitting: Cardiovascular Disease

## 2016-09-01 ENCOUNTER — Encounter (HOSPITAL_COMMUNITY)
Admission: EM | Disposition: A | Discharge status: Home / Self Care | Payer: Self-pay | Source: Home / Self Care | Attending: Cardiovascular Disease

## 2016-09-01 DIAGNOSIS — E782 Mixed hyperlipidemia: Secondary | ICD-10-CM | POA: Diagnosis not present

## 2016-09-01 DIAGNOSIS — G35 Multiple sclerosis: Secondary | ICD-10-CM | POA: Diagnosis present

## 2016-09-01 DIAGNOSIS — K219 Gastro-esophageal reflux disease without esophagitis: Secondary | ICD-10-CM | POA: Diagnosis present

## 2016-09-01 DIAGNOSIS — Z8249 Family history of ischemic heart disease and other diseases of the circulatory system: Secondary | ICD-10-CM

## 2016-09-01 DIAGNOSIS — I2510 Atherosclerotic heart disease of native coronary artery without angina pectoris: Secondary | ICD-10-CM | POA: Diagnosis present

## 2016-09-01 DIAGNOSIS — E785 Hyperlipidemia, unspecified: Secondary | ICD-10-CM | POA: Diagnosis present

## 2016-09-01 DIAGNOSIS — I214 Non-ST elevation (NSTEMI) myocardial infarction: Secondary | ICD-10-CM | POA: Diagnosis not present

## 2016-09-01 DIAGNOSIS — I10 Essential (primary) hypertension: Secondary | ICD-10-CM

## 2016-09-01 DIAGNOSIS — Z833 Family history of diabetes mellitus: Secondary | ICD-10-CM | POA: Diagnosis not present

## 2016-09-01 HISTORY — PX: LEFT HEART CATH AND CORONARY ANGIOGRAPHY: CATH118249

## 2016-09-01 HISTORY — DX: Atherosclerotic heart disease of native coronary artery without angina pectoris: I25.10

## 2016-09-01 LAB — BASIC METABOLIC PANEL
Anion gap: 8 (ref 5–15)
BUN: 10 mg/dL (ref 6–20)
CHLORIDE: 105 mmol/L (ref 101–111)
CO2: 26 mmol/L (ref 22–32)
Calcium: 9.4 mg/dL (ref 8.9–10.3)
Creatinine, Ser: 1.25 mg/dL — ABNORMAL HIGH (ref 0.61–1.24)
GFR calc Af Amer: >60 mL/min (ref >60)
GFR calc non Af Amer: >60 mL/min (ref >60)
GLUCOSE: 100 mg/dL — AB (ref 65–99)
POTASSIUM: 4.1 mmol/L (ref 3.5–5.1)
Sodium: 139 mmol/L (ref 135–145)

## 2016-09-01 LAB — I-STAT TROPONIN, ED
TROPONIN I, POC: 0.68 ng/mL — AB (ref 0.00–0.08)
Troponin i, poc: 0.15 ng/mL (ref 0.00–0.08)

## 2016-09-01 LAB — CBC
HEMATOCRIT: 48.8 % (ref 39.0–52.0)
Hemoglobin: 16.7 g/dL (ref 13.0–17.0)
MCH: 31 pg (ref 26.0–34.0)
MCHC: 34.2 g/dL (ref 30.0–36.0)
MCV: 90.7 fL (ref 78.0–100.0)
Platelets: 226 10*3/uL (ref 150–400)
RBC: 5.38 MIL/uL (ref 4.22–5.81)
RDW: 12.9 % (ref 11.5–15.5)
WBC: 11 10*3/uL — ABNORMAL HIGH (ref 4.0–10.5)

## 2016-09-01 LAB — MRSA PCR SCREENING: MRSA BY PCR: NEGATIVE

## 2016-09-01 LAB — TROPONIN I: Troponin I: 0.75 ng/mL (ref <0.03)

## 2016-09-01 SURGERY — LEFT HEART CATH AND CORONARY ANGIOGRAPHY
Anesthesia: LOCAL

## 2016-09-01 MED ORDER — DIAZEPAM 5 MG PO TABS: 5.0000 mg | ORAL_TABLET | Freq: Four times a day (QID) | ORAL | Status: DC | PRN

## 2016-09-01 MED ORDER — ACETAMINOPHEN 325 MG PO TABS: 650.0000 mg | ORAL_TABLET | ORAL | Status: DC | PRN

## 2016-09-01 MED ORDER — LIDOCAINE HCL (PF) 1 % IJ SOLN
INTRAMUSCULAR | Status: AC
  Filled 2016-09-01: qty 30

## 2016-09-01 MED ORDER — TRAZODONE HCL 50 MG PO TABS: 50.0000 mg | ORAL_TABLET | Freq: Every evening | ORAL | Status: DC | PRN

## 2016-09-01 MED ORDER — ASPIRIN 325 MG PO TABS
325.0000 mg | ORAL_TABLET | Freq: Once | ORAL | Status: AC
  Administered 2016-09-01: 325 mg via ORAL
  Filled 2016-09-01: qty 1

## 2016-09-01 MED ORDER — NITROGLYCERIN 1 MG/10 ML FOR IR/CATH LAB
INTRA_ARTERIAL | Status: DC | PRN
  Administered 2016-09-01 (×2): 100 ug via INTRACORONARY

## 2016-09-01 MED ORDER — ONDANSETRON HCL 4 MG/2ML IJ SOLN: 4.0000 mg | Freq: Four times a day (QID) | INTRAMUSCULAR | Status: DC | PRN

## 2016-09-01 MED ORDER — HYDROCHLOROTHIAZIDE 12.5 MG PO CAPS
12.5000 mg | ORAL_CAPSULE | Freq: Once | ORAL | Status: AC
  Administered 2016-09-01: 12.5 mg via ORAL
  Filled 2016-09-01: qty 1

## 2016-09-01 MED ORDER — ATORVASTATIN CALCIUM 80 MG PO TABS
80.0000 mg | ORAL_TABLET | Freq: Every day | ORAL | Status: DC
  Administered 2016-09-02: 80 mg via ORAL
  Filled 2016-09-01: qty 1

## 2016-09-01 MED ORDER — ASPIRIN 81 MG PO CHEW: 324.0000 mg | CHEWABLE_TABLET | ORAL | Status: DC

## 2016-09-01 MED ORDER — VERAPAMIL HCL 2.5 MG/ML IV SOLN
INTRAVENOUS | Status: DC | PRN
  Administered 2016-09-01: 10 mL via INTRA_ARTERIAL

## 2016-09-01 MED ORDER — ASPIRIN EC 81 MG PO TBEC
81.0000 mg | DELAYED_RELEASE_TABLET | Freq: Every day | ORAL | Status: DC
  Administered 2016-09-02 – 2016-09-03 (×2): 81 mg via ORAL
  Filled 2016-09-01 (×3): qty 1

## 2016-09-01 MED ORDER — ONDANSETRON HCL 4 MG/2ML IJ SOLN
4.0000 mg | Freq: Four times a day (QID) | INTRAMUSCULAR | Status: DC | PRN
  Administered 2016-09-02: 4 mg via INTRAVENOUS
  Filled 2016-09-01: qty 2

## 2016-09-01 MED ORDER — NITROGLYCERIN IN D5W 200-5 MCG/ML-% IV SOLN
10.0000 ug/min | INTRAVENOUS | Status: DC
  Administered 2016-09-01: 10 ug/min via INTRAVENOUS
  Filled 2016-09-01: qty 250

## 2016-09-01 MED ORDER — LOSARTAN POTASSIUM 50 MG PO TABS
50.0000 mg | ORAL_TABLET | Freq: Once | ORAL | Status: AC
  Administered 2016-09-01: 50 mg via ORAL
  Filled 2016-09-01: qty 1

## 2016-09-01 MED ORDER — IOPAMIDOL (ISOVUE-370) INJECTION 76%
INTRAVENOUS | Status: AC
  Filled 2016-09-01: qty 100

## 2016-09-01 MED ORDER — ZOLPIDEM TARTRATE 5 MG PO TABS
5.0000 mg | ORAL_TABLET | Freq: Every evening | ORAL | Status: DC | PRN
  Administered 2016-09-02: 5 mg via ORAL
  Filled 2016-09-01: qty 1

## 2016-09-01 MED ORDER — NITROGLYCERIN 0.4 MG SL SUBL
0.4000 mg | SUBLINGUAL_TABLET | SUBLINGUAL | Status: DC | PRN
  Administered 2016-09-01 (×2): 0.4 mg via SUBLINGUAL
  Filled 2016-09-01 (×2): qty 1

## 2016-09-01 MED ORDER — NITROGLYCERIN 1 MG/10 ML FOR IR/CATH LAB
INTRA_ARTERIAL | Status: AC
  Filled 2016-09-01: qty 10

## 2016-09-01 MED ORDER — ASPIRIN 300 MG RE SUPP: 300.0000 mg | RECTAL | Status: DC

## 2016-09-01 MED ORDER — IOPAMIDOL (ISOVUE-370) INJECTION 76%
INTRAVENOUS | Status: AC
  Filled 2016-09-01: qty 50

## 2016-09-01 MED ORDER — LOSARTAN POTASSIUM-HCTZ 50-12.5 MG PO TABS: 1.0000 | ORAL_TABLET | Freq: Every day | ORAL | Status: DC

## 2016-09-01 MED ORDER — BALSALAZIDE DISODIUM 750 MG PO CAPS: 2250.0000 mg | ORAL_CAPSULE | Freq: Three times a day (TID) | ORAL | Status: DC

## 2016-09-01 MED ORDER — MIDAZOLAM HCL 2 MG/2ML IJ SOLN
INTRAMUSCULAR | Status: DC | PRN
  Administered 2016-09-01: 1 mg via INTRAVENOUS
  Administered 2016-09-01: 2 mg via INTRAVENOUS

## 2016-09-01 MED ORDER — VERAPAMIL HCL 2.5 MG/ML IV SOLN
INTRAVENOUS | Status: AC
  Filled 2016-09-01: qty 2

## 2016-09-01 MED ORDER — MIDAZOLAM HCL 2 MG/2ML IJ SOLN
INTRAMUSCULAR | Status: AC
  Filled 2016-09-01: qty 2

## 2016-09-01 MED ORDER — ATORVASTATIN CALCIUM 80 MG PO TABS: 80.0000 mg | ORAL_TABLET | Freq: Every day | ORAL | Status: DC

## 2016-09-01 MED ORDER — HEPARIN (PORCINE) IN NACL 100-0.45 UNIT/ML-% IJ SOLN
1100.0000 [IU]/h | INTRAMUSCULAR | Status: DC
  Administered 2016-09-01: 1100 [IU]/h via INTRAVENOUS
  Filled 2016-09-01: qty 250

## 2016-09-01 MED ORDER — SODIUM CHLORIDE 0.9 % IV SOLN
INTRAVENOUS | Status: AC | PRN
  Administered 2016-09-01: 150 mL/h via INTRAVENOUS

## 2016-09-01 MED ORDER — VITAMIN D 1000 UNITS PO TABS
1000.0000 [IU] | ORAL_TABLET | Freq: Every day | ORAL | Status: DC
  Administered 2016-09-02 – 2016-09-03 (×2): 1000 [IU] via ORAL
  Filled 2016-09-01 (×3): qty 1

## 2016-09-01 MED ORDER — FENTANYL CITRATE (PF) 100 MCG/2ML IJ SOLN
INTRAMUSCULAR | Status: AC
  Filled 2016-09-01: qty 2

## 2016-09-01 MED ORDER — FENTANYL CITRATE (PF) 100 MCG/2ML IJ SOLN
INTRAMUSCULAR | Status: DC | PRN
  Administered 2016-09-01: 50 ug via INTRAVENOUS
  Administered 2016-09-01: 25 ug via INTRAVENOUS

## 2016-09-01 MED ORDER — MESALAMINE 1000 MG RE SUPP: 1000.0000 mg | Freq: Two times a day (BID) | RECTAL | Status: DC

## 2016-09-01 MED ORDER — LOSARTAN POTASSIUM 50 MG PO TABS
50.0000 mg | ORAL_TABLET | Freq: Every day | ORAL | Status: DC
  Administered 2016-09-02 – 2016-09-03 (×2): 50 mg via ORAL
  Filled 2016-09-01 (×2): qty 1

## 2016-09-01 MED ORDER — SODIUM CHLORIDE 0.9 % IV SOLN: 250.0000 mL | INTRAVENOUS | Status: DC | PRN

## 2016-09-01 MED ORDER — HEPARIN BOLUS VIA INFUSION
4000.0000 [IU] | Freq: Once | INTRAVENOUS | Status: AC
  Administered 2016-09-01: 4000 [IU] via INTRAVENOUS
  Filled 2016-09-01: qty 4000

## 2016-09-01 MED ORDER — BALSALAZIDE DISODIUM 1.1 G PO TABS
2250.0000 mg | ORAL_TABLET | Freq: Three times a day (TID) | ORAL | Status: DC
  Filled 2016-09-01: qty 2

## 2016-09-01 MED ORDER — METOPROLOL TARTRATE 12.5 MG HALF TABLET
12.5000 mg | ORAL_TABLET | Freq: Two times a day (BID) | ORAL | Status: DC
  Administered 2016-09-01 – 2016-09-03 (×4): 12.5 mg via ORAL
  Filled 2016-09-01 (×5): qty 1

## 2016-09-01 MED ORDER — METOPROLOL TARTRATE 12.5 MG HALF TABLET: 12.5000 mg | ORAL_TABLET | Freq: Two times a day (BID) | ORAL | Status: DC

## 2016-09-01 MED ORDER — HEPARIN SODIUM (PORCINE) 1000 UNIT/ML IJ SOLN
INTRAMUSCULAR | Status: AC
  Filled 2016-09-01: qty 1

## 2016-09-01 MED ORDER — NITROGLYCERIN IN D5W 200-5 MCG/ML-% IV SOLN: 0.0000 ug/min | INTRAVENOUS | Status: DC

## 2016-09-01 MED ORDER — ASPIRIN 81 MG PO CHEW: 81.0000 mg | CHEWABLE_TABLET | Freq: Every day | ORAL | Status: DC

## 2016-09-01 MED ORDER — HEPARIN (PORCINE) IN NACL 2-0.9 UNIT/ML-% IJ SOLN
INTRAMUSCULAR | Status: AC | PRN
  Administered 2016-09-01: 1000 mL

## 2016-09-01 MED ORDER — HEPARIN (PORCINE) IN NACL 100-0.45 UNIT/ML-% IJ SOLN
1100.0000 [IU]/h | INTRAMUSCULAR | Status: DC
  Administered 2016-09-02: 1100 [IU]/h via INTRAVENOUS
  Filled 2016-09-01: qty 250

## 2016-09-01 MED ORDER — HYDROCHLOROTHIAZIDE 25 MG PO TABS: 25.0000 mg | ORAL_TABLET | Freq: Every day | ORAL | Status: DC

## 2016-09-01 MED ORDER — SODIUM CHLORIDE 0.9% FLUSH: 3.0000 mL | INTRAVENOUS | Status: DC | PRN

## 2016-09-01 MED ORDER — HEPARIN (PORCINE) IN NACL 2-0.9 UNIT/ML-% IJ SOLN
INTRAMUSCULAR | Status: AC
  Filled 2016-09-01: qty 1000

## 2016-09-01 MED ORDER — IOPAMIDOL (ISOVUE-370) INJECTION 76%
INTRAVENOUS | Status: DC | PRN
  Administered 2016-09-01: 110 mL via INTRA_ARTERIAL

## 2016-09-01 MED ORDER — BALSALAZIDE DISODIUM 750 MG PO CAPS
2250.0000 mg | ORAL_CAPSULE | Freq: Three times a day (TID) | ORAL | Status: DC
  Administered 2016-09-01 – 2016-09-03 (×5): 2250 mg via ORAL
  Filled 2016-09-01 (×7): qty 3

## 2016-09-01 MED ORDER — AMLODIPINE BESYLATE 2.5 MG PO TABS
2.5000 mg | ORAL_TABLET | Freq: Every day | ORAL | Status: DC
  Administered 2016-09-02 – 2016-09-03 (×2): 2.5 mg via ORAL
  Filled 2016-09-01 (×3): qty 1

## 2016-09-01 MED ORDER — NITROGLYCERIN 0.4 MG SL SUBL: 0.4000 mg | SUBLINGUAL_TABLET | SUBLINGUAL | Status: DC | PRN

## 2016-09-01 MED ORDER — SODIUM CHLORIDE 0.9 % IV SOLN
INTRAVENOUS | Status: DC
  Administered 2016-09-01: 22:00:00 via INTRAVENOUS
  Administered 2016-09-02 (×2): 100 mL/h via INTRAVENOUS

## 2016-09-01 MED ORDER — LIDOCAINE HCL (PF) 1 % IJ SOLN
INTRAMUSCULAR | Status: DC | PRN
  Administered 2016-09-01: 2 mL

## 2016-09-01 MED ORDER — SODIUM CHLORIDE 0.9% FLUSH
3.0000 mL | Freq: Two times a day (BID) | INTRAVENOUS | Status: DC
  Administered 2016-09-02: 3 mL via INTRAVENOUS

## 2016-09-01 SURGICAL SUPPLY — 12 items
CATH INFINITI 5FR ANG PIGTAIL (CATHETERS) ×2 IMPLANT
CATH OPTITORQUE TIG 4.0 5F (CATHETERS) ×2 IMPLANT
DEVICE RAD COMP TR BAND LRG (VASCULAR PRODUCTS) ×2 IMPLANT
GLIDESHEATH SLEND SS 6F .021 (SHEATH) ×2 IMPLANT
GUIDEWIRE INQWIRE 1.5J.035X260 (WIRE) ×1 IMPLANT
INQWIRE 1.5J .035X260CM (WIRE) ×2
KIT ENCORE 26 ADVANTAGE (KITS) ×2 IMPLANT
KIT HEART LEFT (KITS) ×2 IMPLANT
PACK CARDIAC CATHETERIZATION (CUSTOM PROCEDURE TRAY) ×2 IMPLANT
SYR MEDRAD MARK V 150ML (SYRINGE) ×2 IMPLANT
TRANSDUCER W/STOPCOCK (MISCELLANEOUS) ×2 IMPLANT
TUBING CIL FLEX 10 FLL-RA (TUBING) ×2 IMPLANT

## 2016-09-01 NOTE — Progress Notes (Signed)
[facility] for heparin Indication: chest pain/ACS  Allergies  Allergen Reactions  . Diclofenac Sodium     REACTION: swelling, respiratory difficulty  . Skelaxin [Metaxalone]     REACTION: Nausea/vomiting    Patient Measurements: Weight: 178 lb 9.2 oz (81 kg)  Vital Signs: Temp: 98.4 F (36.9 C) (06/16 1652) Temp Source: Oral (06/16 1652) BP: 103/60 (06/16 2043) Pulse Rate: 78 (06/16 2048)  Labs:  Recent Labs  09/01/16 1700 09/01/16 1856  HGB 16.7  --   HCT 48.8  --   PLT 226  --   CREATININE 1.25*  --   TROPONINI  --  0.75*    Estimated Creatinine Clearance: 71.3 mL/min (A) (by C-G formula based on SCr of 1.25 mg/dL (H)).   Assessment: 51yo M presents with chest pain- taken to cath lab. To restart heparin 8h post sheath removal- Sheath out at ~2045.  No overt bleeding noted.  Goal of Therapy:  Heparin level 0.3-0.7 units/ml Monitor platelets by anticoagulation protocol: Yes   Plan:  Start heparin infusion at 1100 units/hr at 0500 on 6/17 Heparin level 6 hours after start Daily heparin level and CBC  Brandyn Lowrey D. Angell Honse, PharmD, BCPS Clinical Pharmacist 09/01/2016 9:13 PM

## 2016-09-01 NOTE — ED Provider Notes (Addendum)
[place] DEPT Provider Note   CSN: [id number] Arrival date & time: 09/01/16  1643     History   Chief Complaint Chief Complaint  Patient presents with  . Chest Pain    HPI John Kelly is a 51 y.o. male hx of Multiple sclerosis (relapsing remitting), Hypertension here presenting with chest pain. Patient had acute onset of substernal chest around 2:30 PM today. Denies any radiation to the pain. States that he took one full dose aspirin now the pain is 6 out of 10. Patient states that he has a history of hypertension but took his meds today. Prior to the onset of chest pain, patient has been outside a lot in the heat doing some garden work. Patient states that he had a normal stress test several years ago and never had cardiac stents. Denies any back pain or abdominal pain or arm or leg weakness or numbness. Of note, patient has history of multiple sclerosis and has some chronic blurry vision that is not worsening. Patient had an MRI done several years ago and has been followed up with neurology at [facility].   The history is provided by the patient.    Past Medical History:  Diagnosis Date  . Abnormal liver function test   . Allergic rhinitis   . Allergy   . Anxiety   . DDD (degenerative disc disease)   . Depression   . GERD (gastroesophageal reflux disease)   . Hypertension    no meds  . Insomnia   . MS (multiple sclerosis) ([place])   . Ulcerative colitis    left sided    Patient Active Problem List   Diagnosis Date Noted  . Umbilical hernia 24/58/0998  . Multiple sclerosis ([place]) 12/15/2014  . DDD (degenerative disc disease), lumbar 09/22/2013  . Chronic insomnia 09/22/2013  . Family history of early CAD 09/22/2013  . Pure hyperglyceridemia 12/10/2011  . Depression with anxiety 01/18/2011  . Ulcerative proctosigmoiditis with complication ([place]) 33/82/5053  . Mild intermittent asthma 01/07/2009  . Essential hypertension, benign 04/14/2007  . ALLERGIC RHINITIS  04/14/2007  . GERD 04/14/2007    Past Surgical History:  Procedure Laterality Date  . Inguinal Herniorrhapy     Right and left  . PYLOROPLASTY         Home Medications    Prior to Admission medications   Medication Sig Start Date End Date Taking? Authorizing Provider  balsalazide (COLAZAL) 750 MG capsule Take 3 capsules (2,250 mg total) by mouth 3 (three) times daily. 01/17/15   Ladene Artist, MD  Cholecalciferol (VITAMIN D PO) Take by mouth daily.    [provider]  losartan-hydrochlorothiazide (HYZAAR) 50-12.5 MG tablet Take 1 tablet by mouth daily. 03/30/16   Bedsole, Amy E, MD  mesalamine (CANASA) 1000 MG suppository Place 1 suppository (1,000 mg total) rectally 2 (two) times daily. 01/19/16   Ladene Artist, MD  NON FORMULARY Testosterone booster take 2 capsule daily    [provider]  NON FORMULARY In[place]: takes before workout    [provider]  RaNITidine HCl (ZANTAC PO) Take by mouth daily. Reported on 09/15/2015    [provider]  traZODone (DESYREL) 50 MG tablet TAKE ONE TABLET BY MOUTH AT BEDTIME AS NEEDED FOR SLEEP 06/04/16   Jinny Sanders, MD    Family History Family History  Problem Relation Age of Onset  . Coronary artery disease Mother   . Heart attack Mother   . Hyperlipidemia Mother   . Hypertension Mother   .  Colitis Mother   . Diabetes Father   . Hyperlipidemia Father   . Hypertension Father   . Coronary artery disease Father   . Heart attack Father   . Coronary artery disease Brother   . Heart attack Brother 44       4 stents, smoker  . Diabetes Brother   . Hypertension Brother   . Hypertension Sister   . Transient ischemic attack Sister   . Colon cancer Neg Hx   . Esophageal cancer Neg Hx   . Stomach cancer Neg Hx   . Rectal cancer Neg Hx     Social History Social History  Substance Use Topics  . Smoking status: Never Smoker  . Smokeless tobacco: Never Used  . Alcohol use 0.0 oz/week      Comment: rarely     Allergies   Diclofenac sodium and Skelaxin [metaxalone]   Review of Systems Review of Systems  Cardiovascular: Positive for chest pain.  All other systems reviewed and are negative.    Physical Exam Updated Vital Signs BP (!) 187/102 (BP Location: Right Arm)   Pulse 82   Temp 98.4 F (36.9 C) (Oral)   Resp 16   SpO2 99%   Physical Exam  Constitutional: He is oriented to person, place, and time.  Slightly uncomfortable   HENT:  Head: Normocephalic.  Mouth/Throat: Oropharynx is clear and moist.  Eyes: Conjunctivae and EOM are normal. Pupils are equal, round, and reactive to light.  Neck: Normal range of motion. Neck supple.  Cardiovascular: Normal rate, regular rhythm and normal heart sounds.   Pulmonary/Chest: Effort normal and breath sounds normal. No respiratory distress. He has no wheezes.  Abdominal: Soft. Bowel sounds are normal. He exhibits no distension. There is no tenderness.  Musculoskeletal: Normal range of motion. He exhibits no edema, tenderness or deformity.  Neurological: He is alert and oriented to person, place, and time.  Skin: Skin is warm.  Psychiatric: He has a normal mood and affect.  Nursing note and vitals reviewed.    ED Treatments / Results  Labs (all labs ordered are listed, but only abnormal results are displayed) Labs Reviewed  CBC - Abnormal; Notable for the following:       Result Value   WBC 11.0 (*)    All other components within normal limits  I-STAT TROPOININ, ED - Abnormal; Notable for the following:    Troponin i, poc 0.15 (*)    All other components within normal limits  BASIC METABOLIC PANEL    EKG  EKG Interpretation  Date/Time:  Saturday September 01 2016 16:49:11 EDT Ventricular Rate:  85 PR Interval:  116 QRS Duration: 98 QT Interval:  354 QTC Calculation: 421 R Axis:   68 Text Interpretation:  Normal sinus rhythm Normal ECG No significant change since last tracing Confirmed by Wandra Arthurs  [phone]) on 09/01/2016 5:33:27 PM       Radiology Dg Chest 2 View  Result Date: 09/01/2016 CLINICAL DATA:  Chest pain EXAM: CHEST  2 VIEW COMPARISON:  03/21/2012 chest radiograph. FINDINGS: Stable cardiomediastinal silhouette with normal heart size. No pneumothorax. No pleural effusion. Lungs appear clear, with no acute consolidative airspace disease and no pulmonary edema. IMPRESSION: No active cardiopulmonary disease. Electronically Signed   By: Ilona Sorrel M.D.   On: 09/01/2016 17:39    Procedures Procedures (including critical care time)  CRITICAL CARE Performed by: Wandra Arthurs   Total critical care time: 30 minutes  Critical care time was exclusive  of separately billable procedures and treating other patients.  Critical care was necessary to treat or prevent imminent or life-threatening deterioration.  Critical care was time spent personally by me on the following activities: development of treatment plan with patient and/or surrogate as well as nursing, discussions with consultants, evaluation of patient's response to treatment, examination of patient, obtaining history from patient or surrogate, ordering and performing treatments and interventions, ordering and review of laboratory studies, ordering and review of radiographic studies, pulse oximetry and re-evaluation of patient's condition.   Medications Ordered in ED Medications  nitroGLYCERIN (NITROSTAT) SL tablet 0.4 mg (0.4 mg Sublingual Given 09/01/16 1755)  losartan (COZAAR) tablet 50 mg (not administered)  hydrochlorothiazide (MICROZIDE) capsule 12.5 mg (12.5 mg Oral Given 09/01/16 1756)     Initial Impression / Assessment and Plan / ED Course  I have reviewed the triage vital signs and the nursing notes.  Pertinent labs & imaging results that were available during my care of the patient were reviewed by me and considered in my medical decision making (see chart for details).     John Kelly is a 51 y.o. male  here with chest pain. Substernal chest pain still present, acute onset today. Consider ACS. I doubt PE or dissection. He has hx of HTN but has no back pain, leg pain or weakness or numbness. Will get labs, CXR. Will give BP meds.   6 pm Trop 0.15. Repeat EKG showed J point elevation. Given nitro SL. Cardiology called and is at bedside.   6:22 PM Repeat EKG again showed J point. Cardiology agrees no STEMI. Placed on heparin, nitro drip. Patient doesn't remember what dose of ASA he took so will give full dose ASA. Will admit for possible NSTEMI.   6:42 PM After patient was admitted, he developed wide complex tachycardia on the monitor. Cardiology at bedside. EKG showed intermittent RBBB. Patient awake and alert at this point and cardiology doesn't think he has V tach and doesn't want to start amiodarone. Pending stepdown bed currently.   7:27 PM Second trop 0.68. Still has chest pressure. Code STEMI activated by cardiology to take him for urgent cath.   Final Clinical Impressions(s) / ED Diagnoses   Final diagnoses:  None    New Prescriptions New Prescriptions   No medications on file     Drenda Freeze, MD 09/01/16 Ladoris Gene    Drenda Freeze, MD 09/01/16 1843    Drenda Freeze, MD 09/01/16 [phone]

## 2016-09-01 NOTE — H&P (Addendum)
History & Physical    Patient ID: John Kelly MRN: [id number], DOB/AGE: [birth year]   Admit date: 09/01/2016   Primary Physician: Jinny Sanders, MD Primary Cardiologist: Claiborne Billings ([place])  Past Medical History    Past Medical History:  Diagnosis Date  . Abnormal liver function test   . Allergic rhinitis   . Allergy   . Anxiety   . DDD (degenerative disc disease)   . Depression   . GERD (gastroesophageal reflux disease)   . Hypertension    no meds  . Insomnia   . MS (multiple sclerosis) ([place])   . Ulcerative colitis    left sided    Past Surgical History:  Procedure Laterality Date  . Inguinal Herniorrhapy     Right and left  . PYLOROPLASTY       Allergies  Allergies  Allergen Reactions  . Diclofenac Sodium     REACTION: swelling, respiratory difficulty  . Skelaxin [Metaxalone]     REACTION: Nausea/vomiting    History of Present Illness    51 y o man with PMH of multiple sclerosis and HTN. He works out daily and takes testosterone supplements. Today he was working out in the yard and after he got back into the house he developed acute CP. Non radiating. 10/10. Never had pain like that before. No LEE, PND. He took 2 aspirin tablets of unknown dose and came to ED.   In th ED still in pain. 4/10. Nitro pill improve the pain. BP elevated on presentation to 160-180. At home usually 120-130 syst.   He works in the [employer]. Long work hours. No smoking.  Strong family history of CAD. Brother with MI in the 68's.   Home Medications    Prior to Admission medications   Medication Sig Start Date End Date Taking? Authorizing Provider  balsalazide (COLAZAL) 750 MG capsule Take 3 capsules (2,250 mg total) by mouth 3 (three) times daily. 01/17/15   Ladene Artist, MD  Cholecalciferol (VITAMIN D PO) Take by mouth daily.    [provider]  losartan-hydrochlorothiazide (HYZAAR) 50-12.5 MG tablet Take 1 tablet by mouth daily. 03/30/16   Bedsole, Amy E, MD    mesalamine (CANASA) 1000 MG suppository Place 1 suppository (1,000 mg total) rectally 2 (two) times daily. 01/19/16   Ladene Artist, MD  NON FORMULARY Testosterone booster take 2 capsule daily    [provider]  NON FORMULARY [place]: takes before workout    [provider]  RaNITidine HCl (ZANTAC PO) Take by mouth daily. Reported on 09/15/2015    [provider]  traZODone (DESYREL) 50 MG tablet TAKE ONE TABLET BY MOUTH AT BEDTIME AS NEEDED FOR SLEEP 06/04/16   Jinny Sanders, MD    Family History    Family History  Problem Relation Age of Onset  . Coronary artery disease Mother   . Heart attack Mother   . Hyperlipidemia Mother   . Hypertension Mother   . Colitis Mother   . Diabetes Father   . Hyperlipidemia Father   . Hypertension Father   . Coronary artery disease Father   . Heart attack Father   . Coronary artery disease Brother   . Heart attack Brother 44       4 stents, smoker  . Diabetes Brother   . Hypertension Brother   . Hypertension Sister   . Transient ischemic attack Sister   . Colon cancer Neg Hx   . Esophageal cancer Neg Hx   .  Stomach cancer Neg Hx   . Rectal cancer Neg Hx     Social History    Social History   Social History  . Marital status: Divorced    Spouse name: N/A  . Number of children: 2  . Years of education: N/A   Occupational History  . 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 Smoking status: Never Smoker  . Smokeless tobacco: Never Used  . Alcohol use 0.0 oz/week     Comment: rarely  . Drug use: No  . Sexual activity: Not Currently   Other Topics Concern  . Not on file   Social History Narrative   HSG   Divorced, not sexually active   1 son- '92, 1 daughter - '97   Work: Lobbyist- shipping/ rec'ing; quality management   Patient has never smoked   Alcohol use- rare 1-2 a month   Illicit Drug use- no    Patient gets regular exercise.     Review of Systems     General:  No chills, fever, night sweats or weight changes.  Cardiovascular:  No chest pain, dyspnea on exertion, edema, orthopnea, palpitations, paroxysmal nocturnal dyspnea. Dermatological: No rash, lesions/masses Respiratory: No cough, dyspnea Urologic: No hematuria, dysuria Abdominal:   No nausea, vomiting, diarrhea, bright red blood per rectum, melena, or hematemesis Neurologic:  No visual changes, wkns, changes in mental status. All other systems reviewed and are otherwise negative except as noted above.  Physical Exam    Blood pressure (!) 149/90, pulse 87, temperature 98.4 F (36.9 C), temperature source Oral, resp. rate 18, weight 81 kg (178 lb 9.2 oz), SpO2 95 %.  General: Pleasant, NAD Psych: Normal affect. Neuro: Alert and oriented X 3. Moves all extremities spontaneously. HEENT: Normal  Neck: Supple without bruits or JVD. Lungs:  Resp regular and unlabored, CTA. Heart: RRR no s3, s4, or murmurs. Abdomen: Soft, non-tender, non-distended, BS + x 4.  Extremities: No clubbing, cyanosis or edema. DP/PT/Radials 2+ and equal bilaterally.  Labs    Troponin [facility] of [facility] Test)  Recent Labs  09/01/16 1719  TROPIPOC 0.15*   No results for input(s): CKTOTAL, CKMB, TROPONINI in the last 72 hours. Lab Results  Component Value Date   WBC 11.0 (H) 09/01/2016   HGB 16.7 09/01/2016   HCT 48.8 09/01/2016   MCV 90.7 09/01/2016   PLT 226 09/01/2016    Recent Labs Lab 09/01/16 1700  NA 139  K 4.1  CL 105  CO2 26  BUN 10  CREATININE 1.25*  CALCIUM 9.4  GLUCOSE 100*   Lab Results  Component Value Date   CHOL 194 04/10/2016   HDL 47.70 04/10/2016   LDLCALC 130 (H) 04/10/2016   TRIG 79.0 04/10/2016   Lab Results  Component Value Date   DDIMER 0.28 06/15/2013     Radiology Studies    Dg Chest 2 View  Result Date: 09/01/2016 CLINICAL DATA:  Chest pain EXAM: CHEST  2 VIEW COMPARISON:  03/21/2012 chest radiograph. FINDINGS: Stable cardiomediastinal silhouette  with normal heart size. No pneumothorax. No pleural effusion. Lungs appear clear, with no acute consolidative airspace disease and no pulmonary edema. IMPRESSION: No active cardiopulmonary disease. Electronically Signed   By: Ilona Sorrel M.D.   On: 09/01/2016 17:39    ECG & Cardiac Imaging    NSR with ST elevation in lateral leads. J point elevations perhaps, however new from prior ECG in 2015.   Repeat ECG unchanged.  Assessment & Plan    John Kelly has an NSTEMI. His risk factors are family history, HTN and testosterone use. While in the ED he flipped into a RBBB pattern and had increasing PVCs. He was started on iv nitro and heparin. His pain was improving with up titration of the nitro. Discussed the case with Dr. Claiborne Billings. Agreed to watch closely for symptoms and repeat Trop,  Plan: - Admit. - If repeat trop jumps significantly or symptoms remain uncontrolled with do LHC today.   - Started on Atorva and metop 12.5. Hepatin, nitro drip and ASA given.   Signed, Cristina Gong, MD 09/01/2016, 6:26 PM   Patient seen and examined. Agree with assessment and plan. John Kelly is a 51 year old gentleman who has a history of hypertension and recently has been on losartan HCT combination therapy.  He has a history of multiple sclerosis diagnosed approximately 1-1/2 years ago.  Since developing MS, he has noted some numbness and intermittent weakness to his arms.  He works out regularly and often takes testosterone supplement prior to working out.  There is no history of tobacco use.  He exercises fairly regularly.  He is divorced.  He works in the [employer].  He has a strong family history for CAD in his mother, father, and brother.  His mother died with metastatic cancer.  Father is still living.  Today he was working in the yard in the heat.  Upon getting back into the house in the mid afternoon he developed new onset chest pressure.  This was nonradiating.  He denied nausea or vomiting.  He  presented to the emergency room and his initial ECG at 1649 showed normal sinus rhythm at 85 bpm with 0.5 mm J-point elevation in V3, V4, V5.  A subsequent ECG at 17: 54 was unchanged.  He was started on nitroglycerin and heparin.  A repeat ECG at 18:27  showed transient right bundle branch block and suggested possible intraventricular rhythm. His chest pain improved with IC nitroglycerin and heparin.  At 1831, these changes have resolved and he no longer had J-point elevation. I recommended definitive cardiac catheterization.  Upon arrival to the catheterization laboratory.  His chest pain was 1 out of 10 and ultimately this normalized.  Blood pressure was 112/82.  HEENT was unremarkable.  There are no carotid bruits.  His lungs were clear.  Rhythm was regular.  There was noted S3 gallop.  I did not appreciate any murmur.  Abdomen was soft, nontender.  Pulses were 2+.  It was no edema.  He was intact neurologically.  Plan emergent catheterization. Troy Sine, MD, [facility] 09/01/2016 9:22 PM

## 2016-09-01 NOTE — ED Notes (Addendum)
Pt valuables including wallet. Clothes, and eyeglasses all given to family, Lanelle Bal

## 2016-09-01 NOTE — ED Triage Notes (Signed)
Pt c/o CP describes as pressure starting today at 230pm. Pt hx of MS. Pt appears uncomfrotable. Hx of HTN poorly controlled. VSS.

## 2016-09-01 NOTE — ED Notes (Signed)
Repeat EKG was taken,

## 2016-09-01 NOTE — ED Notes (Signed)
Cardiology at bedside.

## 2016-09-01 NOTE — ED Notes (Signed)
[phone] Bal, Sister

## 2016-09-01 NOTE — Progress Notes (Signed)
ANTICOAGULATION CONSULT NOTE - Initial Consult  Pharmacy Consult for heparin Indication: chest pain/ACS  Allergies  Allergen Reactions  . Diclofenac Sodium     REACTION: swelling, respiratory difficulty  . Skelaxin [Metaxalone]     REACTION: Nausea/vomiting    Patient Measurements: Weight: 178 lb 9.2 oz (81 kg)  Vital Signs: Temp: 98.4 F (36.9 C) (06/16 1652) Temp Source: Oral (06/16 1652) BP: 149/90 (06/16 1805) Pulse Rate: 87 (06/16 1805)  Labs:  Recent Labs  09/01/16 1700  HGB 16.7  HCT 48.8  PLT 226  CREATININE 1.25*    Estimated Creatinine Clearance: 71.3 mL/min (A) (by C-G formula based on SCr of 1.25 mg/dL (H)).   Medical History: Past Medical History:  Diagnosis Date  . Abnormal liver function test   . Allergic rhinitis   . Allergy   . Anxiety   . DDD (degenerative disc disease)   . Depression   . GERD (gastroesophageal reflux disease)   . Hypertension    no meds  . Insomnia   . MS (multiple sclerosis) ([place])   . Ulcerative colitis    left sided    Assessment: 51yo M presents with chest pain. Pharmacy consulted to dose heparin. No PTA anticoagulation, CBC wnl, no bleeding noted  Goal of Therapy:  Heparin level 0.3-0.7 units/ml Monitor platelets by anticoagulation protocol: Yes   Plan:  Give 4000 units bolus x 1 Start heparin infusion at 1100 units/hr Check anti-Xa level in 6 hours and daily while on heparin Continue to monitor H&H and platelets   Gwenlyn Perking, PharmD PGY1 Pharmacy Resident Pager: [phone] 09/01/2016 6:23 PM

## 2016-09-02 LAB — PROTIME-INR
INR: 1.12
Prothrombin Time: 14.4 seconds (ref 11.4–15.2)

## 2016-09-02 LAB — CBC
HCT: 44.7 % (ref 39.0–52.0)
Hemoglobin: 15.3 g/dL (ref 13.0–17.0)
MCH: 31 pg (ref 26.0–34.0)
MCHC: 34.2 g/dL (ref 30.0–36.0)
MCV: 90.7 fL (ref 78.0–100.0)
Platelets: 181 10*3/uL (ref 150–400)
RBC: 4.93 MIL/uL (ref 4.22–5.81)
RDW: 13 % (ref 11.5–15.5)
WBC: 8.7 10*3/uL (ref 4.0–10.5)

## 2016-09-02 LAB — HEPARIN LEVEL (UNFRACTIONATED)
Heparin Unfractionated: 0.21 IU/mL — ABNORMAL LOW (ref 0.30–0.70)
Heparin Unfractionated: 0.38 IU/mL (ref 0.30–0.70)

## 2016-09-02 LAB — BASIC METABOLIC PANEL
Anion gap: 10 (ref 5–15)
BUN: 8 mg/dL (ref 6–20)
CALCIUM: 8.7 mg/dL — AB (ref 8.9–10.3)
CHLORIDE: 104 mmol/L (ref 101–111)
CO2: 22 mmol/L (ref 22–32)
CREATININE: 0.94 mg/dL (ref 0.61–1.24)
GFR calc non Af Amer: >60 mL/min (ref >60)
Glucose, Bld: 81 mg/dL (ref 65–99)
Potassium: 3.6 mmol/L (ref 3.5–5.1)
Sodium: 136 mmol/L (ref 135–145)

## 2016-09-02 MED ORDER — HEPARIN (PORCINE) IN NACL 100-0.45 UNIT/ML-% IJ SOLN
1250.0000 [IU]/h | INTRAMUSCULAR | Status: DC
  Administered 2016-09-02 – 2016-09-03 (×2): 1250 [IU]/h via INTRAVENOUS
  Filled 2016-09-02: qty 250

## 2016-09-02 NOTE — Plan of Care (Signed)
Problem: Cardiovascular: Goal: Vascular access site(s) Level 0-1 will be maintained Outcome: Progressing Right radial access site has remained a level 0-1 throughout night.

## 2016-09-02 NOTE — Progress Notes (Signed)
[place] for heparin Indication: chest pain/ACS  Allergies  Allergen Reactions  . Diclofenac Sodium     REACTION: swelling, respiratory difficulty  . Skelaxin [Metaxalone]     REACTION: Nausea/vomiting    Patient Measurements: Weight: 178 lb 9.2 oz (81 kg)  Vital Signs: Temp: 98.1 F (36.7 C) (06/17 1621) Temp Source: Oral (06/17 1621) BP: 113/67 (06/17 1000) Pulse Rate: 69 (06/17 1800)  Labs:  Recent Labs  09/01/16 1700 09/01/16 1856 09/02/16 0341 09/02/16 1050 09/02/16 1820  HGB 16.7  --  15.3  --   --   HCT 48.8  --  44.7  --   --   PLT 226  --  181  --   --   LABPROT  --   --  14.4  --   --   INR  --   --  1.12  --   --   HEPARINUNFRC  --   --   --  0.21* 0.38  CREATININE 1.25*  --  0.94  --   --   TROPONINI  --  0.75*  --   --   --     Estimated Creatinine Clearance: 94.8 mL/min (by C-G formula based on SCr of 0.94 mg/dL).   Assessment: 51yo M presents with chest pain- taken to cath lab. To restart heparin 8h post sheath removal- Sheath out at ~2045.  Planning medical management for now.  Heparin level in range at 0.38 units/mL this evening. No overt bleeding noted.   Goal of Therapy:  Heparin level 0.3-0.7 units/ml Monitor platelets by anticoagulation protocol: Yes   Plan:  Continue IV heparin at 1250 units/hr Daily heparin level and CBC F/u length of therapy for IV heparin  Aarica Wax D. Que Meneely, PharmD, BCPS Clinical Pharmacist 09/02/2016 7:01 PM

## 2016-09-02 NOTE — Progress Notes (Signed)
[place] for heparin Indication: chest pain/ACS  Allergies  Allergen Reactions  . Diclofenac Sodium     REACTION: swelling, respiratory difficulty  . Skelaxin [Metaxalone]     REACTION: Nausea/vomiting    Patient Measurements: Weight: 178 lb 9.2 oz (81 kg)  Vital Signs: Temp: 97.8 F (36.6 C) (06/17 1221) Temp Source: Oral (06/17 1221) BP: 121/70 (06/17 0800) Pulse Rate: 66 (06/17 0800)  Labs:  Recent Labs  09/01/16 1700 09/01/16 1856 09/02/16 0341 09/02/16 1050  HGB 16.7  --  15.3  --   HCT 48.8  --  44.7  --   PLT 226  --  181  --   LABPROT  --   --  14.4  --   INR  --   --  1.12  --   HEPARINUNFRC  --   --   --  0.21*  CREATININE 1.25*  --  0.94  --   TROPONINI  --  0.75*  --   --     Estimated Creatinine Clearance: 94.8 mL/min (by C-G formula based on SCr of 0.94 mg/dL).   Assessment: 51yo M presents with chest pain- taken to cath lab. To restart heparin 8h post sheath removal- Sheath out at ~2045.  Planning medical management for now.  No overt bleeding noted.  Heparin level slightly below goal range on heparin at 1100 units/hr.  Goal of Therapy:  Heparin level 0.3-0.7 units/ml Monitor platelets by anticoagulation protocol: Yes   Plan:  Increase IV heparin to 1250 units/hr. Recheck heparin level in 6 hrs. Daily heparin level and CBC. F/u plans to d/c heparin tomorrow?  Uvaldo Rising, BCPS  Clinical Pharmacist Pager [phone]  09/02/2016 12:49 PM

## 2016-09-02 NOTE — Progress Notes (Signed)
Progress Note  Patient Name: John Kelly Date of Encounter: 09/02/2016  Primary Cardiologist: Claiborne Billings  Subjective   No chest pain or sob. Feels well.  Inpatient Medications    Scheduled Meds: . amLODipine  2.5 mg Oral Daily  . aspirin EC  81 mg Oral Daily  . atorvastatin  80 mg Oral q1800  . balsalazide  2,250 mg Oral TID  . cholecalciferol  1,000 Units Oral Daily  . losartan  50 mg Oral Daily  . metoprolol tartrate  12.5 mg Oral BID  . sodium chloride flush  3 mL Intravenous Q12H   Continuous Infusions: . sodium chloride 100 mL/hr at 09/02/16 0600  . sodium chloride    . heparin 1,100 Units/hr (09/02/16 0600)  . nitroGLYCERIN Stopped (09/02/16 0000)   PRN Meds: sodium chloride, acetaminophen, diazepam, nitroGLYCERIN, ondansetron (ZOFRAN) IV, sodium chloride flush, zolpidem   Vital Signs    Vitals:   09/02/16 0500 09/02/16 0600 09/02/16 0700 09/02/16 0800  BP: 110/75 97/65 113/66 121/70  Pulse: (!) 58 (!) 57 60 66  Resp: 15 15 (!) 8 15  Temp:      TempSrc:      SpO2: 100% 97% 98% 99%  Weight:        Intake/Output Summary (Last 24 hours) at 09/02/16 0945 Last data filed at 09/02/16 0600  Gross per 24 hour  Intake          1132.04 ml  Output              550 ml  Net           582.04 ml   Filed Weights   09/01/16 1808  Weight: 178 lb 9.2 oz (81 kg)    Telemetry    nsr - Personally Reviewed  ECG    nsr with j point elevation - Personally Reviewed  Physical Exam   GEN: Nwell appearing middle aged man, no acute distress.   Neck: 6 cm JVD Cardiac: RRR, no murmurs, rubs, or gallops.  Respiratory: Clear to auscultation bilaterally. GI: Soft, nontender, non-distended  MS: No edema; No deformity. Neuro:  Nonfocal  Psych: Normal affect   Labs    Chemistry Recent Labs Lab 09/01/16 1700 09/02/16 0341  NA 139 136  K 4.1 3.6  CL 105 104  CO2 26 22  GLUCOSE 100* 81  BUN 10 8  CREATININE 1.25* 0.94  CALCIUM 9.4 8.7*  GFRNONAA >60 >60  GFRAA  >60 >60  ANIONGAP 8 10     Hematology Recent Labs Lab 09/01/16 1700 09/02/16 0341  WBC 11.0* 8.7  RBC 5.38 4.93  HGB 16.7 15.3  HCT 48.8 44.7  MCV 90.7 90.7  MCH 31.0 31.0  MCHC 34.2 34.2  RDW 12.9 13.0  PLT 226 181    Cardiac Enzymes Recent Labs Lab 09/01/16 1856  TROPONINI 0.75*    Recent Labs Lab 09/01/16 1719 09/01/16 1904  TROPIPOC 0.15* 0.68*     BNPNo results for input(s): BNP, PROBNP in the last 168 hours.   DDimer No results for input(s): DDIMER in the last 168 hours.   Radiology    Dg Chest 2 View  Result Date: 09/01/2016 CLINICAL DATA:  Chest pain EXAM: CHEST  2 VIEW COMPARISON:  03/21/2012 chest radiograph. FINDINGS: Stable cardiomediastinal silhouette with normal heart size. No pneumothorax. No pleural effusion. Lungs appear clear, with no acute consolidative airspace disease and no pulmonary edema. IMPRESSION: No active cardiopulmonary disease. Electronically Signed   By: Janina Mayo.D.  On: 09/01/2016 17:39    Cardiac Studies   Left heart cath noted  Patient Profile     51 y.o. male admitted with an acute coronary syndrome, s/p left heart cath demonstrating moderate obstructive CAD but no culprit lesion with the LAD responsive to IC NTG.   Assessment & Plan    1. Acute coronary syndrome - his cardiac enzymes are only minimally elevated. He will continue IV heparin and ntg today, with plans to wean off tomorrow.  2. CAD - he has moderate disease with no culprit lesion identified. He will undergo risk factor modification. He does not smoke. 3. HTN - his blood pressure is well controlled now but was very high on presentation. Continue beta blocker and calcium channel blocker. 4. Dyslipidemia - he will be treated with high dose statin therapy.  Signed, Cristopher Peru, MD  09/02/2016, 9:45 AM  Patient ID: John Kelly, male   DOB: [birth year], 51 y.o.   MRN: [id number]

## 2016-09-03 ENCOUNTER — Encounter (HOSPITAL_COMMUNITY): Payer: Self-pay | Admitting: Cardiovascular Disease

## 2016-09-03 DIAGNOSIS — Z8249 Family history of ischemic heart disease and other diseases of the circulatory system: Secondary | ICD-10-CM

## 2016-09-03 DIAGNOSIS — E782 Mixed hyperlipidemia: Secondary | ICD-10-CM

## 2016-09-03 LAB — CBC
HEMATOCRIT: 45.5 % (ref 39.0–52.0)
Hemoglobin: 15.1 g/dL (ref 13.0–17.0)
MCH: 30.4 pg (ref 26.0–34.0)
MCHC: 33.2 g/dL (ref 30.0–36.0)
MCV: 91.5 fL (ref 78.0–100.0)
Platelets: 190 10*3/uL (ref 150–400)
RBC: 4.97 MIL/uL (ref 4.22–5.81)
RDW: 13.1 % (ref 11.5–15.5)
WBC: 8.1 10*3/uL (ref 4.0–10.5)

## 2016-09-03 LAB — HEPARIN LEVEL (UNFRACTIONATED): Heparin Unfractionated: 0.37 IU/mL (ref 0.30–0.70)

## 2016-09-03 MED ORDER — LOSARTAN POTASSIUM 50 MG PO TABS: 50.0000 mg | ORAL_TABLET | Freq: Every day | ORAL | 5 refills | Status: DC

## 2016-09-03 MED ORDER — AMLODIPINE BESYLATE 2.5 MG PO TABS: 2.5000 mg | ORAL_TABLET | Freq: Every day | ORAL | 5 refills | Status: DC

## 2016-09-03 MED ORDER — ATORVASTATIN CALCIUM 80 MG PO TABS: 80.0000 mg | ORAL_TABLET | Freq: Every day | ORAL | 5 refills | Status: DC

## 2016-09-03 MED ORDER — ASPIRIN 81 MG PO TBEC: 81.0000 mg | DELAYED_RELEASE_TABLET | Freq: Every day | ORAL | 11 refills | Status: DC

## 2016-09-03 MED ORDER — CLOPIDOGREL BISULFATE 75 MG PO TABS: 75.0000 mg | ORAL_TABLET | Freq: Every day | ORAL | 11 refills | Status: DC

## 2016-09-03 MED ORDER — METOPROLOL TARTRATE 25 MG PO TABS: 12.5000 mg | ORAL_TABLET | Freq: Two times a day (BID) | ORAL | 5 refills | Status: DC

## 2016-09-03 MED ORDER — NITROGLYCERIN 0.4 MG SL SUBL: 0.4000 mg | SUBLINGUAL_TABLET | SUBLINGUAL | 6 refills | Status: DC | PRN

## 2016-09-03 NOTE — Progress Notes (Signed)
09/03/2016 3:26 PM D/c avs form, medications already taken today and those due this evening given and explained to patient. Follow up appointments and when to call MD reviewed. RX reviewed. D/c iv. D/c tele. D/c home per orders.   Kayn Haymore, Arville Lime

## 2016-09-03 NOTE — Discharge Summary (Signed)
Discharge Summary    Patient ID: John Kelly,  MRN: [id number], DOB/AGE: 52-Apr-1967 52 y.o.  Admit date: 09/01/2016 Discharge date: 09/03/2016  Primary Care Provider: Jinny Sanders Primary Cardiologist: Dr Claiborne Billings  Discharge Diagnoses    Active Problems:   NSTEMI (non-ST elevated myocardial infarction) [facility])   Non-ST elevation (NSTEMI) myocardial infarction [facility])   Allergies Allergies  Allergen Reactions  . Diclofenac Sodium     REACTION: swelling, respiratory difficulty  . Skelaxin [Metaxalone]     REACTION: Nausea/vomiting    Diagnostic Studies/Procedures    Left Heart Cath and Coronary Angiography  09/01/16  Conclusion    Ost LAD lesion, 60 - 70%  Proximal LAD, 20%  Proximal RCA 30%  Ost RCA to Prox RCA lesion, 30 %stenosed.  The left ventricular ejection fraction is 55-65% by visual estimate.  The left ventricular systolic function is normal.  LV end diastolic pressure is normal.   Preserved LV function with an ejection fraction at 60% with a subtle area of very mild focal mid anterolateral hypocontractility.  Two-vessel coronary obstructive disease with ostial LAD stenosis of 60-70%, which improved slightly following IC nitroglycerin administration and mild 20% proximal LAD stenosis.  Normal co-dominant large left circumflex coronary artery, and RCA with 30% smooth proximal stenosis.  RECOMMENDATION: I suspect the patient today may have had transient vasospasm of his ostial LAD stenosis contributing to J-point elevation in leads V3 to V5 which normalized following a short burst of idioventricular rhythm.  He was pain-free on IV nitro drip.  At present, will attempt initial medical therapy trial and will discontinue his diuretic and initiate beta blocker and amlodipine.  He will be started on atorvastatin 80 mg in attempt to induce plaque regression.  If the lesion progresses, a LIMA to LAD may be necessary since the lesion may not be amenable to stenting  due to a very short left main, and large circumflex without potentially jailing the circumflex vessel.    Diagnostic Diagram         History of Present Illness     John Kelly is a 19 y o man with PMH of multiple sclerosis and HTN. He works out daily and takes testosterone supplements who developed new onset non-radiating chest pain and presented to the ED on 09/01/16. He has a strong family history of CAD with brother having an MI in his 50's. He had J point elevation in V3-V5.   Hospital Course     Consultants: None  The patient was taken emergently to the cath lab by Dr. Claiborne Billings and was found to have 2 vessel coronary obstructive disease with ostial LAD stenosis of 60-70% and mild 20% proximal LAD stenosis, large left circumflex and RCA with 30% smooth proximal stenosis. It was thought that the patient may have had transient vasospasm of his ostial LAD stenosis contributing to the J-point elevation which normalized after short burst of idioventricular rhythm. Medical therapy was recommended. His diuretic was discontinued and beta blocker and amlodipine initiated, ARB continued. He was started on atorvastatin 80 mg daily in an attempt to induce plaque regression. Per Dr. Claiborne Billings, If the lesion progresses, a LIMA to LAD may be necessary since the lesion may not be amenable to stenting due to a very short left main, and large circumflex without potentially jailing the circumflex vessel.   If recurrent symptoms would consider further evaluation possibly with FFR. His LVEF is well preserved. His blood pressure is now controlled. Because patient had elevated troponin,  we will add Plavix to aspirin.  He goes to gym 4-5 times a week, he is advised to do so and not start till next week. He would rather exercise on his own then go to the rehabilitation. We will arrange for follow-up in our clinic in 1-2 weeks.  _____________  Discharge Vitals Blood pressure 128/76, pulse 63, temperature 98.3 F  (36.8 C), temperature source Oral, resp. rate 16, weight 178 lb 9.2 oz (81 kg), SpO2 97 %.  Filed Weights   09/01/16 1808  Weight: 178 lb 9.2 oz (81 kg)    Labs & Radiologic Studies    CBC  Recent Labs  09/02/16 0341 09/03/16 0245  WBC 8.7 8.1  HGB 15.3 15.1  HCT 44.7 45.5  MCV 90.7 91.5  PLT 181 831   Basic Metabolic Panel  Recent Labs  09/01/16 1700 09/02/16 0341  NA 139 136  K 4.1 3.6  CL 105 104  CO2 26 22  GLUCOSE 100* 81  BUN 10 8  CREATININE 1.25* 0.94  CALCIUM 9.4 8.7*   Liver Function Tests No results for input(s): AST, ALT, ALKPHOS, BILITOT, PROT, ALBUMIN in the last 72 hours. No results for input(s): LIPASE, AMYLASE in the last 72 hours. Cardiac Enzymes  Recent Labs  09/01/16 1856  TROPONINI 0.75*   BNP Invalid input(s): POCBNP D-Dimer No results for input(s): DDIMER in the last 72 hours. Hemoglobin A1C No results for input(s): HGBA1C in the last 72 hours. Fasting Lipid Panel No results for input(s): CHOL, HDL, LDLCALC, TRIG, CHOLHDL, LDLDIRECT in the last 72 hours. Thyroid Function Tests No results for input(s): TSH, T4TOTAL, T3FREE, THYROIDAB in the last 72 hours.  Invalid input(s): FREET3 _____________  Dg Chest 2 View  Result Date: 09/01/2016 CLINICAL DATA:  Chest pain EXAM: CHEST  2 VIEW COMPARISON:  03/21/2012 chest radiograph. FINDINGS: Stable cardiomediastinal silhouette with normal heart size. No pneumothorax. No pleural effusion. Lungs appear clear, with no acute consolidative airspace disease and no pulmonary edema. IMPRESSION: No active cardiopulmonary disease. Electronically Signed   By: Ilona Sorrel M.D.   On: 09/01/2016 17:39   Disposition   Pt is being discharged home today in good condition.  Follow-up Plans & Appointments    Follow-up Information    [facility], [place] Follow up.   Specialties:  Cardiology, Radiology Why:  on July 2nd at 10:30 for cardiology hospital follow up.  Contact information: [address] [place] [address] [phone]          Discharge Instructions    Diet - low sodium heart healthy    Complete by:  As directed    Discharge instructions    Complete by:  As directed    PLEASE REMEMBER TO BRING ALL OF YOUR MEDICATIONS TO EACH OF YOUR FOLLOW-UP OFFICE VISITS.  PLEASE ATTEND ALL SCHEDULED FOLLOW-UP APPOINTMENTS.   Activity: Increase activity slowly as tolerated. You may shower, but no soaking baths (or swimming) for 1 week. No driving for 1 week. No lifting over 10 lbs for 2 weeks. No sexual activity for 2 weeks.   You May Return to Work: in 1 week (if applicable)  Wound Care: You may wash cath site gently with soap and water. Keep cath site clean and dry. If you notice pain, swelling, bleeding or pus at your cath site, please call [phone].   Increase activity slowly    Complete by:  As directed       Discharge Medications   Current Discharge Medication  List    START taking these medications   Details  amLODipine (NORVASC) 2.5 MG tablet Take 1 tablet (2.5 mg total) by mouth daily. Qty: 30 tablet, Refills: 5    aspirin 81 MG EC tablet Take 1 tablet (81 mg total) by mouth daily. Qty: 30 tablet, Refills: 11    atorvastatin (LIPITOR) 80 MG tablet Take 1 tablet (80 mg total) by mouth daily at 6 PM. Qty: 30 tablet, Refills: 5    clopidogrel (PLAVIX) 75 MG tablet Take 1 tablet (75 mg total) by mouth daily. Qty: 30 tablet, Refills: 11    losartan (COZAAR) 50 MG tablet Take 1 tablet (50 mg total) by mouth daily. Qty: 30 tablet, Refills: 5    metoprolol tartrate (LOPRESSOR) 25 MG tablet Take 0.5 tablets (12.5 mg total) by mouth 2 (two) times daily. Qty: 30 tablet, Refills: 5    nitroGLYCERIN (NITROSTAT) 0.4 MG SL tablet Place 1 tablet (0.4 mg total) under the tongue every 5 (five) minutes x 3 doses as needed for chest pain. Qty: 25 tablet, Refills: 6      CONTINUE these medications which have NOT CHANGED   Details  balsalazide  (COLAZAL) 750 MG capsule Take 3 capsules (2,250 mg total) by mouth 3 (three) times daily. Qty: 810 capsule, Refills: 3    Cholecalciferol (VITAMIN D PO) Take by mouth daily.    RaNITidine HCl (ZANTAC PO) Take by mouth daily. Reported on 09/15/2015    mesalamine (CANASA) 1000 MG suppository Place 1 suppository (1,000 mg total) rectally 2 (two) times daily. Qty: 60 suppository, Refills: 1    traZODone (DESYREL) 50 MG tablet TAKE ONE TABLET BY MOUTH AT BEDTIME AS NEEDED FOR SLEEP Qty: 90 tablet, Refills: 1      STOP taking these medications     aspirin 325 MG tablet      losartan-hydrochlorothiazide (HYZAAR) 50-12.5 MG tablet      NON FORMULARY      NON FORMULARY          Aspirin prescribed at discharge?  Yes High Intensity Statin Prescribed? (Lipitor 40-8m or Crestor 20-447m: Yes Beta Blocker Prescribed? Yes For EF <40%, was ACEI/ARB Prescribed? N/A ADP Receptor Inhibitor Prescribed? (i.e. Plavix etc.-Includes Medically Managed Patients): Yes For EF <40%, Aldosterone Inhibitor Prescribed? No: N/A Was EF assessed during THIS hospitalization? Yes Was Cardiac Rehab II ordered? (Included Medically managed Patients): No: Pt does not want.   Outstanding Labs/Studies   None  Duration of Discharge Encounter   Greater than 30 minutes including physician time.  Signed, JaDaune PerchP 09/03/2016, 2:00 PM

## 2016-09-03 NOTE — Care Management Note (Signed)
Case Management Note Marvetta Gibbons RN, BSN Unit 2W-Case Manager-- [place] coverage [phone]  Patient Details  Name: John Kelly MRN: [id number] Date of Birth: 05/26/1965  Subjective/Objective:   Pt admitted with NSTEMI s/p cath- demonstrating moderate obstructive CAD but no culprit lesion                  Action/Plan: PTA pt lived at home- independent- plan for risk factor modification- CM to follow.   Expected Discharge Date:                  Expected Discharge Plan:  Home/Self Care  In-House Referral:     Discharge planning Services  CM Consult  Post Acute Care Choice:    Choice offered to:     DME Arranged:    DME Agency:     HH Arranged:    HH Agency:     Status of Service:  In process, will continue to follow  If discussed at Long Length of Stay Meetings, dates discussed:    Discharge Disposition:   Additional Comments:  Dawayne Patricia, RN 09/03/2016, 10:07 AM

## 2016-09-03 NOTE — Progress Notes (Signed)
[place] for heparin Indication: chest pain/ACS  Allergies  Allergen Reactions  . Diclofenac Sodium     REACTION: swelling, respiratory difficulty  . Skelaxin [Metaxalone]     REACTION: Nausea/vomiting    Patient Measurements: Weight: 178 lb 9.2 oz (81 kg)  Vital Signs: Temp: 97.7 F (36.5 C) (06/18 0752) Temp Source: Oral (06/18 0752) BP: 135/86 (06/18 0900) Pulse Rate: 80 (06/18 1000)  Labs:  Recent Labs  09/01/16 1700 09/01/16 1856 09/02/16 0341 09/02/16 1050 09/02/16 1820 09/03/16 0245  HGB 16.7  --  15.3  --   --  15.1  HCT 48.8  --  44.7  --   --  45.5  PLT 226  --  181  --   --  190  LABPROT  --   --  14.4  --   --   --   INR  --   --  1.12  --   --   --   HEPARINUNFRC  --   --   --  0.21* 0.38 0.37  CREATININE 1.25*  --  0.94  --   --   --   TROPONINI  --  0.75*  --   --   --   --     Estimated Creatinine Clearance: 94.8 mL/min (by C-G formula based on SCr of 0.94 mg/dL).  . sodium chloride 100 mL/hr (09/02/16 2254)  . sodium chloride    . heparin 1,250 Units/hr (09/03/16 1000)  . nitroGLYCERIN Stopped (09/02/16 0000)    Assessment: 51yo M presents with chest pain- taken to cath lab. Heparin restarted 8h post sheath removal- .  Planning medical management for now, no culprit lesions identified in cath.  No overt bleeding noted.  CBC stable.  Heparin level within goal range on heparin at 1100 units/hr.  Goal of Therapy:  Heparin level 0.3-0.7 units/ml Monitor platelets by anticoagulation protocol: Yes   Plan:  Continue IV heparin at current rate. Recheck heparin level in 6 hrs. Daily heparin level and CBC. F/u plans to d/c heparin today?  Uvaldo Rising, BCPS  Clinical Pharmacist Pager [phone]  09/03/2016 10:15 AM

## 2016-09-03 NOTE — Progress Notes (Signed)
Progress Note  Patient Name: John Kelly Date of Encounter: 09/03/2016  Primary Cardiologist: Claiborne Billings  Subjective   The patient has minimal chest tightness but nothing like when he arrived to the hospital and no shortness of breath. He has been out of bed yet.  Inpatient Medications    Scheduled Meds: . amLODipine  2.5 mg Oral Daily  . aspirin EC  81 mg Oral Daily  . atorvastatin  80 mg Oral q1800  . balsalazide  2,250 mg Oral TID  . cholecalciferol  1,000 Units Oral Daily  . losartan  50 mg Oral Daily  . metoprolol tartrate  12.5 mg Oral BID  . sodium chloride flush  3 mL Intravenous Q12H   Continuous Infusions: . sodium chloride 100 mL/hr (09/02/16 2254)  . sodium chloride    . heparin 1,250 Units/hr (09/03/16 1000)  . nitroGLYCERIN Stopped (09/02/16 0000)   PRN Meds: sodium chloride, acetaminophen, diazepam, nitroGLYCERIN, ondansetron (ZOFRAN) IV, sodium chloride flush, zolpidem   Vital Signs    Vitals:   09/03/16 0752 09/03/16 0800 09/03/16 0900 09/03/16 1000  BP:  (!) 144/76 135/86   Pulse:  (!) 59 78 80  Resp:  15 19 14   Temp: 97.7 F (36.5 C)     TempSrc: Oral     SpO2:  98% 99% 99%  Weight:        Intake/Output Summary (Last 24 hours) at 09/03/16 1101 Last data filed at 09/03/16 1000  Gross per 24 hour  Intake             3303 ml  Output                0 ml  Net             3303 ml   Filed Weights   09/01/16 1808  Weight: 178 lb 9.2 oz (81 kg)    Telemetry    nsr - Personally Reviewed  ECG     SR, biphasic T waves in the anterolateral leads suggestive of ischemia - this is new, PVC - Personally Reviewed  Physical Exam   GEN: no acute distress.   Neck: 6 cm JVD Cardiac: RRR, no murmurs, rubs, or gallops.  Respiratory: Clear to auscultation bilaterally. GI: Soft, nontender, non-distended  MS: No edema; No deformity. Neuro:  Nonfocal  Psych: Normal affect   Labs    Chemistry  Recent Labs Lab 09/01/16 1700 09/02/16 0341  NA  139 136  K 4.1 3.6  CL 105 104  CO2 26 22  GLUCOSE 100* 81  BUN 10 8  CREATININE 1.25* 0.94  CALCIUM 9.4 8.7*  GFRNONAA >60 >60  GFRAA >60 >60  ANIONGAP 8 10     Hematology  Recent Labs Lab 09/01/16 1700 09/02/16 0341 09/03/16 0245  WBC 11.0* 8.7 8.1  RBC 5.38 4.93 4.97  HGB 16.7 15.3 15.1  HCT 48.8 44.7 45.5  MCV 90.7 90.7 91.5  MCH 31.0 31.0 30.4  MCHC 34.2 34.2 33.2  RDW 12.9 13.0 13.1  PLT 226 181 190    Cardiac Enzymes  Recent Labs Lab 09/01/16 1856  TROPONINI 0.75*     Recent Labs Lab 09/01/16 1719 09/01/16 1904  TROPIPOC 0.15* 0.68*     BNPNo results for input(s): BNP, PROBNP in the last 168 hours.   DDimer No results for input(s): DDIMER in the last 168 hours.   Radiology    Dg Chest 2 View  Result Date: 09/01/2016 CLINICAL DATA:  Chest pain EXAM:  CHEST  2 VIEW COMPARISON:  03/21/2012 chest radiograph. FINDINGS: Stable cardiomediastinal silhouette with normal heart size. No pneumothorax. No pleural effusion. Lungs appear clear, with no acute consolidative airspace disease and no pulmonary edema. IMPRESSION: No active cardiopulmonary disease. Electronically Signed   By: Ilona Sorrel M.D.   On: 09/01/2016 17:39    Cardiac Studies   Left heart cath noted  Patient Profile     51 y.o. male admitted with an acute coronary syndrome, s/p left heart cath demonstrating moderate obstructive CAD but no culprit lesion with the LAD responsive to IC NTG.   Assessment & Plan    1. Acute coronary syndrome - his cardiac enzymes are only minimally elevated. He will continue IV heparin and ntg today, with plans to wean off tomorrow.  2. CAD - he has moderate disease with no culprit lesion identified. He will undergo risk factor modification. He does not smoke. 3. HTN - his blood pressure is well controlled now but was very high on presentation. Continue beta blocker and calcium channel blocker. 4. Dyslipidemia - he will be treated with high dose statin  therapy.  I have reviewed his images with Dr. Martinique who confirms that ostial LAD has 60% stenosis and agrees that medical management would be the best initial approach, we will discharge home on aggressive medical management and only patient has recurrent symptoms we'll consider further evaluation possibly with FFR. His LVEF is well preserved. Has blood pressure is now controlled. Because patient had elevated troponin, we will add Plavix to aspirin, continue metoprolol, losartan, high dose of atorvastatin. Discharge home today if he is able to walk and has no symptoms. He will need a letter for work to stay at home and of the week. He goes to gym 4-5 times a week, he is advised to do so and not start till next week. He would rather exercise on his own then go to the rehabilitation. We will arrange for follow-up in our clinic in 1-2 weeks.  Signed, Ena Dawley, MD

## 2016-09-05 ENCOUNTER — Telehealth: Payer: Self-pay | Admitting: Physician Assistant

## 2016-09-05 MED ORDER — ISOSORBIDE MONONITRATE ER 60 MG PO TB24: 60.0000 mg | ORAL_TABLET | Freq: Every day | ORAL | 3 refills | Status: DC

## 2016-09-05 NOTE — Telephone Encounter (Signed)
New message    Pt c/o of Chest Pain: STAT if CP now or developed within 24 hours  1. Are you having CP right now? Off and on-mildly.  2. Are you experiencing any other symptoms (ex. SOB, nausea, vomiting, sweating)? no  3. How long have you been experiencing CP? Since last night  4. Is your CP continuous or coming and going? Coming and going   5. Have you taken Nitroglycerin? One before bed last night

## 2016-09-05 NOTE — Telephone Encounter (Signed)
Spoke with pt, he was discharged 2 days ago and started having chest pain again last night. It was a burning/pressure in the center of his chest. He took zantac with no change. He took 1 NTG and after 30-45 mins fell asleep. Today he has had the same type pain off and on. It really has not gone away. Discussed with dr Oval Linsey, patient to start isosorbide 60 mg, he will take the first dose, 1/2 tablet tonight. Follow up scheduled for him to see the APP tomorrow. Patient voiced understanding if he continues to have pain he should go back to the hospital.

## 2016-09-06 ENCOUNTER — Encounter: Payer: Self-pay | Admitting: Cardiology

## 2016-09-06 ENCOUNTER — Ambulatory Visit (INDEPENDENT_AMBULATORY_CARE_PROVIDER_SITE_OTHER): Payer: Managed Care, Other (non HMO) | Admitting: Cardiology

## 2016-09-06 VITALS — BP 118/62 | HR 60 | Ht 67.0 in | Wt 176.2 lb

## 2016-09-06 DIAGNOSIS — I10 Essential (primary) hypertension: Secondary | ICD-10-CM | POA: Diagnosis not present

## 2016-09-06 DIAGNOSIS — Z8249 Family history of ischemic heart disease and other diseases of the circulatory system: Secondary | ICD-10-CM | POA: Diagnosis not present

## 2016-09-06 DIAGNOSIS — R079 Chest pain, unspecified: Secondary | ICD-10-CM | POA: Diagnosis not present

## 2016-09-06 DIAGNOSIS — I2510 Atherosclerotic heart disease of native coronary artery without angina pectoris: Secondary | ICD-10-CM | POA: Diagnosis not present

## 2016-09-06 DIAGNOSIS — Z8669 Personal history of other diseases of the nervous system and sense organs: Secondary | ICD-10-CM

## 2016-09-06 DIAGNOSIS — I214 Non-ST elevation (NSTEMI) myocardial infarction: Secondary | ICD-10-CM

## 2016-09-06 DIAGNOSIS — E785 Hyperlipidemia, unspecified: Secondary | ICD-10-CM

## 2016-09-06 DIAGNOSIS — E7800 Pure hypercholesterolemia, unspecified: Secondary | ICD-10-CM | POA: Insufficient documentation

## 2016-09-06 DIAGNOSIS — D689 Coagulation defect, unspecified: Secondary | ICD-10-CM

## 2016-09-06 DIAGNOSIS — G35 Multiple sclerosis: Secondary | ICD-10-CM

## 2016-09-06 LAB — CBC
Hematocrit: 44 % (ref 37.5–51.0)
Hemoglobin: 14.9 g/dL (ref 13.0–17.7)
MCH: 31.2 pg (ref 26.6–33.0)
MCHC: 33.9 g/dL (ref 31.5–35.7)
MCV: 92 fL (ref 79–97)
Platelets: 198 10*3/uL (ref 150–379)
RBC: 4.77 x10E6/uL (ref 4.14–5.80)
RDW: 13.2 % (ref 12.3–15.4)
WBC: 6.5 10*3/uL (ref 3.4–10.8)

## 2016-09-06 LAB — BASIC METABOLIC PANEL
BUN/Creatinine Ratio: 10 (ref 9–20)
BUN: 9 mg/dL (ref 6–24)
CO2: 25 mmol/L (ref 20–29)
Calcium: 9.2 mg/dL (ref 8.7–10.2)
Chloride: 103 mmol/L (ref 96–106)
Creatinine, Ser: 0.93 mg/dL (ref 0.76–1.27)
GFR calc Af Amer: 109 mL/min/{1.73_m2} (ref >59)
GFR calc non Af Amer: 95 mL/min/{1.73_m2} (ref >59)
Glucose: 88 mg/dL (ref 65–99)
Potassium: 5.1 mmol/L (ref 3.5–5.2)
Sodium: 142 mmol/L (ref 134–144)

## 2016-09-06 LAB — PROTIME-INR
INR: 1.1 (ref 0.8–1.2)
Prothrombin Time: 11.2 s (ref 9.1–12.0)

## 2016-09-06 NOTE — H&P (Signed)
09/06/2016 John Kelly   30-Apr-1965  [id number]  Primary Physician Jinny Sanders, MD Primary Cardiologist: Dr Claiborne Billings  HPI:  51 y/o male admitted 09/01/16 with chest pain and Troponin elevation. He was taken to the cath lab by Dr Claiborne Billings. Cath showed a proximal 64% LAD stenosis. His Troponin peaked at 0.68. He was kept on heparin for 24 hours. High dose statin, Plavix, ASA, nitrate, and beta bloker added. He was discharged and was to f/u in a week. Last night he called complaining of recurrent SSCP "pressure" in bed similar to his pre admission symptoms though not as severe. Imdur was called in and he was added to my schedule this am. The pt denies any associated nausea, vomiting, diaphoresis or radiation to his arms or jaw. He does admit to some SOB with his sym,ptms. He says he took one NTG last night and fell black asleep but he still notes "1/10" chest pain today.          Current Outpatient Prescriptions  Medication Sig Dispense Refill  . amLODipine (NORVASC) 2.5 MG tablet Take 1 tablet (2.5 mg total) by mouth daily. 30 tablet 5  . aspirin 81 MG EC tablet Take 1 tablet (81 mg total) by mouth daily. 30 tablet 11  . atorvastatin (LIPITOR) 80 MG tablet Take 1 tablet (80 mg total) by mouth daily at 6 PM. 30 tablet 5  . balsalazide (COLAZAL) 750 MG capsule Take 3 capsules (2,250 mg total) by mouth 3 (three) times daily. (Patient taking differently: Take 2,250 mg by mouth 2 (two) times daily. ) 810 capsule 3  . Cholecalciferol (VITAMIN D PO) Take by mouth daily.    . clopidogrel (PLAVIX) 75 MG tablet Take 1 tablet (75 mg total) by mouth daily. 30 tablet 11  . isosorbide mononitrate (IMDUR) 60 MG 24 hr tablet Take 1 tablet (60 mg total) by mouth daily. 90 tablet 3  . losartan (COZAAR) 50 MG tablet Take 1 tablet (50 mg total) by mouth daily. 30 tablet 5  . mesalamine (CANASA) 1000 MG suppository Place 1 suppository (1,000 mg total) rectally 2 (two) times daily. 60 suppository 1  . metoprolol  tartrate (LOPRESSOR) 25 MG tablet Take 0.5 tablets (12.5 mg total) by mouth 2 (two) times daily. 30 tablet 5  . nitroGLYCERIN (NITROSTAT) 0.4 MG SL tablet Place 1 tablet (0.4 mg total) under the tongue every 5 (five) minutes x 3 doses as needed for chest pain. 25 tablet 6  . RaNITidine HCl (ZANTAC PO) Take by mouth daily. Reported on 09/15/2015    . traZODone (DESYREL) 50 MG tablet TAKE ONE TABLET BY MOUTH AT BEDTIME AS NEEDED FOR SLEEP 90 tablet 1   No current facility-administered medications for this visit.          Allergies  Allergen Reactions  . Diclofenac Sodium     REACTION: swelling, respiratory difficulty  . Skelaxin [Metaxalone]     REACTION: Nausea/vomiting        Past Medical History:  Diagnosis Date  . Abnormal liver function test   . Allergic rhinitis   . Allergy   . Anxiety   . DDD (degenerative disc disease)   . Depression   . GERD (gastroesophageal reflux disease)   . Hypertension    no meds  . Insomnia   . MS (multiple sclerosis) ([place])   . Non-STEMI (non-ST elevated myocardial infarction) ([place]) 09/01/2016   LHC 09/01/16: 60-70% ostial LAD stenosis, 20% mid LAD, 30% RCA. EF 55-65%  . Ulcerative  colitis    left sided    Social History        Social History  . Marital status: Divorced    Spouse name: N/A  . Number of children: 2  . Years of education: N/A       Occupational History  . Steel distribution [employer]         Social History Main Topics  . Smoking status: Never Smoker  . Smokeless tobacco: Never Used  . Alcohol use 0.0 oz/week     Comment: rarely  . Drug use: No  . Sexual activity: Not Currently       Other Topics Concern  . Not on file      Social History Narrative   HSG   Divorced, not sexually active   1 son- '92, 1 daughter - '97   Work: Lobbyist- shipping/ rec'ing; quality management   Patient has never smoked   Alcohol use- rare 1-2 a month   Illicit Drug  use- no    Patient gets regular exercise.          Family History  Problem Relation Age of Onset  . Coronary artery disease Mother   . Heart attack Mother   . Hyperlipidemia Mother   . Hypertension Mother   . Colitis Mother   . Diabetes Father   . Hyperlipidemia Father   . Hypertension Father   . Coronary artery disease Father   . Heart attack Father   . Coronary artery disease Brother   . Heart attack Brother 44       4 stents, smoker  . Diabetes Brother   . Hypertension Brother   . Hypertension Sister   . Transient ischemic attack Sister   . Colon cancer Neg Hx   . Esophageal cancer Neg Hx   . Stomach cancer Neg Hx   . Rectal cancer Neg Hx      Review of Systems: General: negative for chills, fever, night sweats or weight changes.  Cardiovascular: negative for dyspnea on exertion, edema, orthopnea, palpitations, paroxysmal nocturnal dyspnea or shortness of breath Dermatological: negative for rash Respiratory: negative for cough or wheezing Urologic: negative for hematuria Abdominal: negative for nausea, vomiting, diarrhea, bright red blood per rectum, melena, or hematemesis Neurologic: negative for visual changes, syncope, or dizziness All other systems reviewed and are otherwise negative except as noted above.    Blood pressure 118/62, pulse 60, height 5' 7"  (1.702 m), weight 176 lb 3.2 oz (79.9 kg), SpO2 98 %.  General appearance: alert, cooperative and no distress Neck: no carotid bruit and no JVD Lungs: clear to auscultation bilaterally Heart: regular rate and rhythm Abdomen: soft, non-tender; bowel sounds normal; no masses,  no organomegaly Extremities: extremities normal, atraumatic, no cyanosis or edema Pulses: 2+ and symmetric Rt radial site without hematoma Skin: Skin color, texture, turgor normal. No rashes or lesions Neurologic: Grossly normal  EKG NSR- no acute changes  ASSESSMENT AND PLAN:   Chest pain with  moderate risk of acute coronary syndrome Pt seen in the office today with complaints of chest pain similar to his pre cath symptoms  NSTEMI (non-ST elevated myocardial infarction) ([place]) NSTEMI by Troponin (0.68 peak) and symptoms 09/02/16  Family history of early CAD Brother had early CAD  Essential hypertension, benign Controlled  Dyslipidemia High dose statin Rx started  History of multiple sclerosis .   PLAN  Discussed with Dr Martinique in the office today. Cath films reviewed. Site of the lesion is  concerning as is he continued symptoms. He feels it would be best to re study him with FFR. The pt's symptoms are actually almost gone in the office. He started Imdur last night. I offered to admit him today but he preferred same day cath tomorrow. He knows to go to the ED if he has increased symptoms.   Kerin Ransom PA-C 09/06/2016

## 2016-09-06 NOTE — Assessment & Plan Note (Addendum)
NSTEMI by Troponin (0.68 peak) and symptoms 09/02/16

## 2016-09-06 NOTE — Assessment & Plan Note (Signed)
High dose statin Rx started

## 2016-09-06 NOTE — Assessment & Plan Note (Signed)
Controlled.  

## 2016-09-06 NOTE — Assessment & Plan Note (Signed)
Brother had early CAD

## 2016-09-06 NOTE — Patient Instructions (Addendum)
   [facility] [address] [place] [address] Dept: [phone] Loc: [phone]  John Kelly  09/06/2016  You are scheduled for a Cardiac Catheterization on Friday, June 22 with Dr. Lauree Chandler.  1. Please arrive at the [facility] (Main Entrance A) at [facility]: [address] [place], [place] [address] at 1:00 PM (two hours before your procedure to ensure your preparation). Free valet parking service is available.   Special note: Every effort is made to have your procedure done on time. Please understand that emergencies sometimes delay scheduled procedures.  2. Diet: Do not eat or drink anything after midnight prior to your procedure except sips of water to take medications.  3. Labs: TODAY-CBC, BMP, PT/INR  4. Medication instructions in preparation for your procedure:   On the morning of your procedure, take your Aspirin and PLAVIX any morning medicines NOT listed above.  You may use sips of water.  5. Plan for one night stay--bring personal belongings. 6. Bring a current list of your medications and current insurance cards. 7. You MUST have a responsible person to drive you home. 8. Someone MUST be with you the first 24 hours after you arrive home or your discharge will be delayed. 9. Please wear clothes that are easy to get on and off and wear slip-on shoes.  Thank you for allowing [place] to care for you!   -- [facility]     IF YOU HAVE ANY ISSUES BEFORE YOUR PROCEDURE PLEASE GO TO THE EMERGENCY ROOM.

## 2016-09-06 NOTE — Assessment & Plan Note (Signed)
Pt seen in the office today with complaints of chest pain similar to his pre cath symptoms

## 2016-09-06 NOTE — Progress Notes (Signed)
09/06/2016 John Kelly   05-17-1965  [id number]  Primary Physician Jinny Sanders, MD Primary Cardiologist: Dr Claiborne Billings  HPI:  51 y/o male admitted 09/01/16 with chest pain and Troponin elevation. He was taken to the cath lab by Dr Claiborne Billings. Cath showed a proximal 64% LAD stenosis. His Troponin peaked at 0.68. He was kept on heparin for 24 hours. High dose statin, Plavix, ASA, nitrate, and beta bloker added. He was discharged and was to f/u in a week. Last night he called complaining of recurrent SSCP "pressure" in bed similar to his pre admission symptoms though not as severe. Imdur was called in and he was added to my schedule this am. The pt denies any associated nausea, vomiting, diaphoresis or radiation to his arms or jaw. He does admit to some SOB with his sym,ptms. He says he took one NTG last night and fell black asleep but he still notes "1/10" chest pain today.    Current Outpatient Prescriptions  Medication Sig Dispense Refill  . amLODipine (NORVASC) 2.5 MG tablet Take 1 tablet (2.5 mg total) by mouth daily. 30 tablet 5  . aspirin 81 MG EC tablet Take 1 tablet (81 mg total) by mouth daily. 30 tablet 11  . atorvastatin (LIPITOR) 80 MG tablet Take 1 tablet (80 mg total) by mouth daily at 6 PM. 30 tablet 5  . balsalazide (COLAZAL) 750 MG capsule Take 3 capsules (2,250 mg total) by mouth 3 (three) times daily. (Patient taking differently: Take 2,250 mg by mouth 2 (two) times daily. ) 810 capsule 3  . Cholecalciferol (VITAMIN D PO) Take by mouth daily.    . clopidogrel (PLAVIX) 75 MG tablet Take 1 tablet (75 mg total) by mouth daily. 30 tablet 11  . isosorbide mononitrate (IMDUR) 60 MG 24 hr tablet Take 1 tablet (60 mg total) by mouth daily. 90 tablet 3  . losartan (COZAAR) 50 MG tablet Take 1 tablet (50 mg total) by mouth daily. 30 tablet 5  . mesalamine (CANASA) 1000 MG suppository Place 1 suppository (1,000 mg total) rectally 2 (two) times daily. 60 suppository 1  . metoprolol tartrate  (LOPRESSOR) 25 MG tablet Take 0.5 tablets (12.5 mg total) by mouth 2 (two) times daily. 30 tablet 5  . nitroGLYCERIN (NITROSTAT) 0.4 MG SL tablet Place 1 tablet (0.4 mg total) under the tongue every 5 (five) minutes x 3 doses as needed for chest pain. 25 tablet 6  . RaNITidine HCl (ZANTAC PO) Take by mouth daily. Reported on 09/15/2015    . traZODone (DESYREL) 50 MG tablet TAKE ONE TABLET BY MOUTH AT BEDTIME AS NEEDED FOR SLEEP 90 tablet 1   No current facility-administered medications for this visit.     Allergies  Allergen Reactions  . Diclofenac Sodium     REACTION: swelling, respiratory difficulty  . Skelaxin [Metaxalone]     REACTION: Nausea/vomiting    Past Medical History:  Diagnosis Date  . Abnormal liver function test   . Allergic rhinitis   . Allergy   . Anxiety   . DDD (degenerative disc disease)   . Depression   . GERD (gastroesophageal reflux disease)   . Hypertension    no meds  . Insomnia   . MS (multiple sclerosis) ([place])   . Non-STEMI (non-ST elevated myocardial infarction) ([place]) 09/01/2016   LHC 09/01/16: 60-70% ostial LAD stenosis, 20% mid LAD, 30% RCA. EF 55-65%  . Ulcerative colitis    left sided    Social History   Social  History  . Marital status: Divorced    Spouse name: N/A  . Number of children: 2  . Years of education: N/A   Occupational History  . 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 Smoking status: Never Smoker  . Smokeless tobacco: Never Used  . Alcohol use 0.0 oz/week     Comment: rarely  . Drug use: No  . Sexual activity: Not Currently   Other Topics Concern  . Not on file   Social History Narrative   HSG   Divorced, not sexually active   1 son- '92, 1 daughter - '97   Work: Lobbyist- shipping/ rec'ing; quality management   Patient has never smoked   Alcohol use- rare 1-2 a month   Illicit Drug use- no    Patient gets regular exercise.     Family History  Problem Relation Age of  Onset  . Coronary artery disease Mother   . Heart attack Mother   . Hyperlipidemia Mother   . Hypertension Mother   . Colitis Mother   . Diabetes Father   . Hyperlipidemia Father   . Hypertension Father   . Coronary artery disease Father   . Heart attack Father   . Coronary artery disease Brother   . Heart attack Brother 44       4 stents, smoker  . Diabetes Brother   . Hypertension Brother   . Hypertension Sister   . Transient ischemic attack Sister   . Colon cancer Neg Hx   . Esophageal cancer Neg Hx   . Stomach cancer Neg Hx   . Rectal cancer Neg Hx      Review of Systems: General: negative for chills, fever, night sweats or weight changes.  Cardiovascular: negative for dyspnea on exertion, edema, orthopnea, palpitations, paroxysmal nocturnal dyspnea or shortness of breath Dermatological: negative for rash Respiratory: negative for cough or wheezing Urologic: negative for hematuria Abdominal: negative for nausea, vomiting, diarrhea, bright red blood per rectum, melena, or hematemesis Neurologic: negative for visual changes, syncope, or dizziness All other systems reviewed and are otherwise negative except as noted above.    Blood pressure 118/62, pulse 60, height 5' 7"  (1.702 m), weight 176 lb 3.2 oz (79.9 kg), SpO2 98 %.  General appearance: alert, cooperative and no distress Neck: no carotid bruit and no JVD Lungs: clear to auscultation bilaterally Heart: regular rate and rhythm Abdomen: soft, non-tender; bowel sounds normal; no masses,  no organomegaly Extremities: extremities normal, atraumatic, no cyanosis or edema Pulses: 2+ and symmetric Rt radial site without hematoma Skin: Skin color, texture, turgor normal. No rashes or lesions Neurologic: Grossly normal  EKG NSR- no acute changes  ASSESSMENT AND PLAN:   Chest pain with moderate risk of acute coronary syndrome Pt seen in the office today with complaints of chest pain similar to his pre cath  symptoms  NSTEMI (non-ST elevated myocardial infarction) ([place]) NSTEMI by Troponin (0.68 peak) and symptoms 09/02/16  Family history of early CAD Brother had early CAD  Essential hypertension, benign Controlled  Dyslipidemia High dose statin Rx started  History of multiple sclerosis .   PLAN  Discussed with Dr Martinique in the office today. Cath films reviewed. Site of the lesion is concerning as is he continued symptoms. He feels it would be best to re study him with FFR. The pt's symptoms are actually almost gone in the office. He started Imdur last night. I offered to admit him today but he  preferred same day cath tomorrow. He knows to go to the ED if he has increased symptoms.   Kerin Ransom PA-C 09/06/2016 11:10 AM

## 2016-09-07 ENCOUNTER — Encounter (HOSPITAL_COMMUNITY): Payer: Self-pay | Admitting: Cardiovascular Disease

## 2016-09-07 ENCOUNTER — Encounter (HOSPITAL_COMMUNITY)
Admission: RE | Disposition: A | Discharge status: Home / Self Care | Payer: Self-pay | Source: Ambulatory Visit | Attending: Cardiovascular Disease

## 2016-09-07 ENCOUNTER — Other Ambulatory Visit: Payer: Self-pay

## 2016-09-07 ENCOUNTER — Ambulatory Visit (HOSPITAL_COMMUNITY)
Admission: RE | Admit: 2016-09-07 | Discharge: 2016-09-08 | Disposition: A | Discharge status: Home / Self Care | Payer: Managed Care, Other (non HMO) | Source: Ambulatory Visit | Attending: Cardiovascular Disease | Admitting: Cardiovascular Disease

## 2016-09-07 DIAGNOSIS — I252 Old myocardial infarction: Secondary | ICD-10-CM | POA: Insufficient documentation

## 2016-09-07 DIAGNOSIS — I25110 Atherosclerotic heart disease of native coronary artery with unstable angina pectoris: Secondary | ICD-10-CM

## 2016-09-07 DIAGNOSIS — G4700 Insomnia, unspecified: Secondary | ICD-10-CM | POA: Diagnosis not present

## 2016-09-07 DIAGNOSIS — K219 Gastro-esophageal reflux disease without esophagitis: Secondary | ICD-10-CM | POA: Diagnosis not present

## 2016-09-07 DIAGNOSIS — Z8249 Family history of ischemic heart disease and other diseases of the circulatory system: Secondary | ICD-10-CM | POA: Diagnosis not present

## 2016-09-07 DIAGNOSIS — I10 Essential (primary) hypertension: Secondary | ICD-10-CM | POA: Diagnosis present

## 2016-09-07 DIAGNOSIS — G35 Multiple sclerosis: Secondary | ICD-10-CM | POA: Diagnosis not present

## 2016-09-07 DIAGNOSIS — Z7982 Long term (current) use of aspirin: Secondary | ICD-10-CM | POA: Diagnosis not present

## 2016-09-07 DIAGNOSIS — E7800 Pure hypercholesterolemia, unspecified: Secondary | ICD-10-CM | POA: Diagnosis present

## 2016-09-07 DIAGNOSIS — Z955 Presence of coronary angioplasty implant and graft: Secondary | ICD-10-CM

## 2016-09-07 DIAGNOSIS — R079 Chest pain, unspecified: Secondary | ICD-10-CM

## 2016-09-07 DIAGNOSIS — I200 Unstable angina: Secondary | ICD-10-CM | POA: Diagnosis present

## 2016-09-07 DIAGNOSIS — Z8669 Personal history of other diseases of the nervous system and sense organs: Secondary | ICD-10-CM

## 2016-09-07 DIAGNOSIS — I214 Non-ST elevation (NSTEMI) myocardial infarction: Secondary | ICD-10-CM

## 2016-09-07 DIAGNOSIS — I2510 Atherosclerotic heart disease of native coronary artery without angina pectoris: Secondary | ICD-10-CM | POA: Diagnosis present

## 2016-09-07 DIAGNOSIS — Z7902 Long term (current) use of antithrombotics/antiplatelets: Secondary | ICD-10-CM | POA: Diagnosis not present

## 2016-09-07 DIAGNOSIS — F419 Anxiety disorder, unspecified: Secondary | ICD-10-CM | POA: Diagnosis not present

## 2016-09-07 DIAGNOSIS — E785 Hyperlipidemia, unspecified: Secondary | ICD-10-CM | POA: Diagnosis not present

## 2016-09-07 DIAGNOSIS — F329 Major depressive disorder, single episode, unspecified: Secondary | ICD-10-CM | POA: Insufficient documentation

## 2016-09-07 HISTORY — PX: CORONARY STENT INTERVENTION: CATH118234

## 2016-09-07 HISTORY — PX: CORONARY ANGIOGRAPHY: CATH118303

## 2016-09-07 HISTORY — PX: LEFT HEART CATH AND CORONARY ANGIOGRAPHY: CATH118249

## 2016-09-07 HISTORY — DX: Atherosclerotic heart disease of native coronary artery without angina pectoris: I25.10

## 2016-09-07 LAB — POCT ACTIVATED CLOTTING TIME: Activated Clotting Time: 268 seconds

## 2016-09-07 SURGERY — CORONARY STENT INTERVENTION
Anesthesia: LOCAL

## 2016-09-07 MED ORDER — LIDOCAINE HCL (PF) 1 % IJ SOLN
INTRAMUSCULAR | Status: DC | PRN
  Administered 2016-09-07: 2 mL via INTRADERMAL

## 2016-09-07 MED ORDER — NITROGLYCERIN 1 MG/10 ML FOR IR/CATH LAB
INTRA_ARTERIAL | Status: AC
  Filled 2016-09-07: qty 10

## 2016-09-07 MED ORDER — ANGIOPLASTY BOOK
Freq: Once | Status: AC
  Administered 2016-09-07: 1
  Filled 2016-09-07: qty 1

## 2016-09-07 MED ORDER — ACETAMINOPHEN 325 MG PO TABS: 650.0000 mg | ORAL_TABLET | ORAL | Status: DC | PRN

## 2016-09-07 MED ORDER — HEPARIN (PORCINE) IN NACL 2-0.9 UNIT/ML-% IJ SOLN
INTRAMUSCULAR | Status: AC
  Filled 2016-09-07: qty 1000

## 2016-09-07 MED ORDER — HEPARIN SODIUM (PORCINE) 1000 UNIT/ML IJ SOLN
INTRAMUSCULAR | Status: AC
  Filled 2016-09-07: qty 1

## 2016-09-07 MED ORDER — VERAPAMIL HCL 2.5 MG/ML IV SOLN
INTRAVENOUS | Status: AC
  Filled 2016-09-07: qty 2

## 2016-09-07 MED ORDER — LABETALOL HCL 5 MG/ML IV SOLN: 10.0000 mg | INTRAVENOUS | Status: AC | PRN

## 2016-09-07 MED ORDER — ISOSORBIDE MONONITRATE ER 60 MG PO TB24
60.0000 mg | ORAL_TABLET | Freq: Every day | ORAL | Status: DC
  Administered 2016-09-08: 60 mg via ORAL
  Filled 2016-09-07: qty 1

## 2016-09-07 MED ORDER — MIDAZOLAM HCL 2 MG/2ML IJ SOLN
INTRAMUSCULAR | Status: AC
  Filled 2016-09-07: qty 2

## 2016-09-07 MED ORDER — SODIUM CHLORIDE 0.9 % IV SOLN
INTRAVENOUS | Status: AC
  Administered 2016-09-07: 16:00:00 via INTRAVENOUS

## 2016-09-07 MED ORDER — VERAPAMIL HCL 2.5 MG/ML IV SOLN
INTRAVENOUS | Status: DC | PRN
  Administered 2016-09-07: 10 mL via INTRA_ARTERIAL

## 2016-09-07 MED ORDER — ASPIRIN EC 81 MG PO TBEC
81.0000 mg | DELAYED_RELEASE_TABLET | Freq: Every day | ORAL | Status: DC
  Administered 2016-09-08: 09:00:00 81 mg via ORAL
  Filled 2016-09-07: qty 1

## 2016-09-07 MED ORDER — SODIUM CHLORIDE 0.9 % IV SOLN: 250.0000 mL | INTRAVENOUS | Status: DC | PRN

## 2016-09-07 MED ORDER — HYDRALAZINE HCL 20 MG/ML IJ SOLN: 5.0000 mg | INTRAMUSCULAR | Status: AC | PRN

## 2016-09-07 MED ORDER — AMLODIPINE BESYLATE 5 MG PO TABS
2.5000 mg | ORAL_TABLET | Freq: Every day | ORAL | Status: DC
  Administered 2016-09-08: 09:00:00 2.5 mg via ORAL
  Filled 2016-09-07: qty 1

## 2016-09-07 MED ORDER — BALSALAZIDE DISODIUM 750 MG PO CAPS
2250.0000 mg | ORAL_CAPSULE | Freq: Three times a day (TID) | ORAL | Status: DC
  Administered 2016-09-07 – 2016-09-08 (×2): 2250 mg via ORAL
  Filled 2016-09-07 (×2): qty 3

## 2016-09-07 MED ORDER — NITROGLYCERIN 1 MG/10 ML FOR IR/CATH LAB
INTRA_ARTERIAL | Status: DC | PRN
  Administered 2016-09-07: 200 ug via INTRACORONARY

## 2016-09-07 MED ORDER — FENTANYL CITRATE (PF) 100 MCG/2ML IJ SOLN
INTRAMUSCULAR | Status: DC | PRN
  Administered 2016-09-07: 50 ug via INTRAVENOUS
  Administered 2016-09-07 (×2): 25 ug via INTRAVENOUS

## 2016-09-07 MED ORDER — MIDAZOLAM HCL 2 MG/2ML IJ SOLN
INTRAMUSCULAR | Status: DC | PRN
  Administered 2016-09-07 (×2): 1 mg via INTRAVENOUS
  Administered 2016-09-07: 2 mg via INTRAVENOUS

## 2016-09-07 MED ORDER — HEPARIN SODIUM (PORCINE) 1000 UNIT/ML IJ SOLN
INTRAMUSCULAR | Status: DC | PRN
  Administered 2016-09-07: 2000 [IU] via INTRAVENOUS
  Administered 2016-09-07: 10000 [IU] via INTRAVENOUS

## 2016-09-07 MED ORDER — IOPAMIDOL (ISOVUE-370) INJECTION 76%
INTRAVENOUS | Status: AC
  Filled 2016-09-07: qty 100

## 2016-09-07 MED ORDER — NITROGLYCERIN 0.4 MG SL SUBL: 0.4000 mg | SUBLINGUAL_TABLET | SUBLINGUAL | Status: DC | PRN

## 2016-09-07 MED ORDER — CLOPIDOGREL BISULFATE 75 MG PO TABS
75.0000 mg | ORAL_TABLET | Freq: Every day | ORAL | Status: DC
  Administered 2016-09-08: 75 mg via ORAL
  Filled 2016-09-07: qty 1

## 2016-09-07 MED ORDER — IOPAMIDOL (ISOVUE-370) INJECTION 76%
INTRAVENOUS | Status: DC | PRN
  Administered 2016-09-07: 125 mL via INTRA_ARTERIAL

## 2016-09-07 MED ORDER — ONDANSETRON HCL 4 MG/2ML IJ SOLN: 4.0000 mg | Freq: Four times a day (QID) | INTRAMUSCULAR | Status: DC | PRN

## 2016-09-07 MED ORDER — HEPARIN (PORCINE) IN NACL 2-0.9 UNIT/ML-% IJ SOLN
INTRAMUSCULAR | Status: AC | PRN
  Administered 2016-09-07: 1000 mL

## 2016-09-07 MED ORDER — LIDOCAINE HCL 1 % IJ SOLN
INTRAMUSCULAR | Status: AC
  Filled 2016-09-07: qty 20

## 2016-09-07 MED ORDER — LOSARTAN POTASSIUM 50 MG PO TABS
50.0000 mg | ORAL_TABLET | Freq: Every day | ORAL | Status: DC
  Administered 2016-09-08: 09:00:00 50 mg via ORAL
  Filled 2016-09-07: qty 1

## 2016-09-07 MED ORDER — FENTANYL CITRATE (PF) 100 MCG/2ML IJ SOLN
INTRAMUSCULAR | Status: AC
  Filled 2016-09-07: qty 2

## 2016-09-07 MED ORDER — METOPROLOL TARTRATE 12.5 MG HALF TABLET
12.5000 mg | ORAL_TABLET | Freq: Two times a day (BID) | ORAL | Status: DC
  Administered 2016-09-07 – 2016-09-08 (×2): 12.5 mg via ORAL
  Filled 2016-09-07 (×2): qty 1

## 2016-09-07 MED ORDER — SODIUM CHLORIDE 0.9% FLUSH
3.0000 mL | Freq: Two times a day (BID) | INTRAVENOUS | Status: DC
  Administered 2016-09-07: 20:00:00 3 mL via INTRAVENOUS

## 2016-09-07 MED ORDER — ATORVASTATIN CALCIUM 80 MG PO TABS
80.0000 mg | ORAL_TABLET | Freq: Every day | ORAL | Status: DC
  Administered 2016-09-07: 18:00:00 80 mg via ORAL
  Filled 2016-09-07: qty 1

## 2016-09-07 MED ORDER — ZOLPIDEM TARTRATE 5 MG PO TABS
5.0000 mg | ORAL_TABLET | Freq: Every evening | ORAL | Status: DC | PRN
  Administered 2016-09-07: 23:00:00 5 mg via ORAL
  Filled 2016-09-07: qty 1

## 2016-09-07 MED ORDER — SODIUM CHLORIDE 0.9% FLUSH: 3.0000 mL | INTRAVENOUS | Status: DC | PRN

## 2016-09-07 MED ORDER — SODIUM CHLORIDE 0.9 % WEIGHT BASED INFUSION
3.0000 mL/kg/h | INTRAVENOUS | Status: DC
  Administered 2016-09-07: 3 mL/kg/h via INTRAVENOUS

## 2016-09-07 MED ORDER — BALSALAZIDE DISODIUM 750 MG PO CAPS: 2250.0000 mg | ORAL_CAPSULE | Freq: Three times a day (TID) | ORAL | Status: DC

## 2016-09-07 MED ORDER — ASPIRIN 81 MG PO CHEW: 81.0000 mg | CHEWABLE_TABLET | Freq: Once | ORAL | Status: DC

## 2016-09-07 MED ORDER — IOPAMIDOL (ISOVUE-370) INJECTION 76%
INTRAVENOUS | Status: AC
  Filled 2016-09-07: qty 50

## 2016-09-07 MED ORDER — SODIUM CHLORIDE 0.9 % WEIGHT BASED INFUSION: 1.0000 mL/kg/h | INTRAVENOUS | Status: DC

## 2016-09-07 SURGICAL SUPPLY — 16 items
BALLN ~~LOC~~ EUPHORA RX 4.0X6 (BALLOONS) ×2
BALLOON ~~LOC~~ EUPHORA RX 4.0X6 (BALLOONS) ×1 IMPLANT
CATH INFINITI JR4 5F (CATHETERS) ×2 IMPLANT
CATH VISTA GUIDE 6FR XBLAD3.5 (CATHETERS) ×2 IMPLANT
DEVICE RAD COMP TR BAND LRG (VASCULAR PRODUCTS) ×2 IMPLANT
GLIDESHEATH SLEND SS 6F .021 (SHEATH) ×2 IMPLANT
GUIDEWIRE INQWIRE 1.5J.035X260 (WIRE) ×1 IMPLANT
INQWIRE 1.5J .035X260CM (WIRE) ×2
KIT ENCORE 26 ADVANTAGE (KITS) ×2 IMPLANT
KIT ESSENTIALS PG (KITS) ×2 IMPLANT
KIT HEART LEFT (KITS) ×2 IMPLANT
PACK CARDIAC CATHETERIZATION (CUSTOM PROCEDURE TRAY) ×2 IMPLANT
STENT RESOLUTE ONYX 4.0X8 (Permanent Stent) ×2 IMPLANT
TRANSDUCER W/STOPCOCK (MISCELLANEOUS) ×2 IMPLANT
TUBING CIL FLEX 10 FLL-RA (TUBING) ×2 IMPLANT
WIRE COUGAR XT STRL 190CM (WIRE) ×2 IMPLANT

## 2016-09-07 NOTE — Interval H&P Note (Signed)
History and Physical Interval Note:  09/07/2016 2:21 PM  John Kelly  has presented today for cardiac cath with the diagnosis of unstable angina.  The various methods of treatment have been discussed with the patient and family. After consideration of risks, benefits and other options for treatment, the patient has consented to  Procedure(s): Left Heart Cath and Coronary Angiography (N/A) as a surgical intervention .  The patient's history has been reviewed, patient examined, no change in status, stable for surgery.  I have reviewed the patient's chart and labs.  Questions were answered to the patient's satisfaction.    Cath Lab Visit (complete for each Cath Lab visit)  Clinical Evaluation Leading to the Procedure:   ACS: No.  Non-ACS:    Anginal Classification: CCS III  Anti-ischemic medical therapy: Maximal Therapy (2 or more classes of medications)  Non-Invasive Test Results: No non-invasive testing performed  Prior CABG: No previous CABG        Lauree Chandler

## 2016-09-07 NOTE — H&P (View-Only) (Signed)
09/06/2016 John Kelly   Aug 07, 1965  [id number]  Primary Physician Jinny Sanders, MD Primary Cardiologist: Dr Claiborne Billings  HPI:  51 y/o male admitted 09/01/16 with chest pain and Troponin elevation. He was taken to the cath lab by Dr Claiborne Billings. Cath showed a proximal 64% LAD stenosis. His Troponin peaked at 0.68. He was kept on heparin for 24 hours. High dose statin, Plavix, ASA, nitrate, and beta bloker added. He was discharged and was to f/u in a week. Last night he called complaining of recurrent SSCP "pressure" in bed similar to his pre admission symptoms though not as severe. Imdur was called in and he was added to my schedule this am. The pt denies any associated nausea, vomiting, diaphoresis or radiation to his arms or jaw. He does admit to some SOB with his sym,ptms. He says he took one NTG last night and fell black asleep but he still notes "1/10" chest pain today.    Current Outpatient Prescriptions  Medication Sig Dispense Refill  . amLODipine (NORVASC) 2.5 MG tablet Take 1 tablet (2.5 mg total) by mouth daily. 30 tablet 5  . aspirin 81 MG EC tablet Take 1 tablet (81 mg total) by mouth daily. 30 tablet 11  . atorvastatin (LIPITOR) 80 MG tablet Take 1 tablet (80 mg total) by mouth daily at 6 PM. 30 tablet 5  . balsalazide (COLAZAL) 750 MG capsule Take 3 capsules (2,250 mg total) by mouth 3 (three) times daily. (Patient taking differently: Take 2,250 mg by mouth 2 (two) times daily. ) 810 capsule 3  . Cholecalciferol (VITAMIN D PO) Take by mouth daily.    . clopidogrel (PLAVIX) 75 MG tablet Take 1 tablet (75 mg total) by mouth daily. 30 tablet 11  . isosorbide mononitrate (IMDUR) 60 MG 24 hr tablet Take 1 tablet (60 mg total) by mouth daily. 90 tablet 3  . losartan (COZAAR) 50 MG tablet Take 1 tablet (50 mg total) by mouth daily. 30 tablet 5  . mesalamine (CANASA) 1000 MG suppository Place 1 suppository (1,000 mg total) rectally 2 (two) times daily. 60 suppository 1  . metoprolol tartrate  (LOPRESSOR) 25 MG tablet Take 0.5 tablets (12.5 mg total) by mouth 2 (two) times daily. 30 tablet 5  . nitroGLYCERIN (NITROSTAT) 0.4 MG SL tablet Place 1 tablet (0.4 mg total) under the tongue every 5 (five) minutes x 3 doses as needed for chest pain. 25 tablet 6  . RaNITidine HCl (ZANTAC PO) Take by mouth daily. Reported on 09/15/2015    . traZODone (DESYREL) 50 MG tablet TAKE ONE TABLET BY MOUTH AT BEDTIME AS NEEDED FOR SLEEP 90 tablet 1   No current facility-administered medications for this visit.     Allergies  Allergen Reactions  . Diclofenac Sodium     REACTION: swelling, respiratory difficulty  . Skelaxin [Metaxalone]     REACTION: Nausea/vomiting    Past Medical History:  Diagnosis Date  . Abnormal liver function test   . Allergic rhinitis   . Allergy   . Anxiety   . DDD (degenerative disc disease)   . Depression   . GERD (gastroesophageal reflux disease)   . Hypertension    no meds  . Insomnia   . MS (multiple sclerosis) ([place])   . Non-STEMI (non-ST elevated myocardial infarction) ([place]) 09/01/2016   LHC 09/01/16: 60-70% ostial LAD stenosis, 20% mid LAD, 30% RCA. EF 55-65%  . Ulcerative colitis    left sided    Social History   Social  History  . Marital status: Divorced    Spouse name: N/A  . Number of children: 2  . Years of education: N/A   Occupational History  . 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 Smoking status: Never Smoker  . Smokeless tobacco: Never Used  . Alcohol use 0.0 oz/week     Comment: rarely  . Drug use: No  . Sexual activity: Not Currently   Other Topics Concern  . Not on file   Social History Narrative   HSG   Divorced, not sexually active   1 son- '92, 1 daughter - '97   Work: Lobbyist- shipping/ rec'ing; quality management   Patient has never smoked   Alcohol use- rare 1-2 a month   Illicit Drug use- no    Patient gets regular exercise.     Family History  Problem Relation Age of  Onset  . Coronary artery disease Mother   . Heart attack Mother   . Hyperlipidemia Mother   . Hypertension Mother   . Colitis Mother   . Diabetes Father   . Hyperlipidemia Father   . Hypertension Father   . Coronary artery disease Father   . Heart attack Father   . Coronary artery disease Brother   . Heart attack Brother 44       4 stents, smoker  . Diabetes Brother   . Hypertension Brother   . Hypertension Sister   . Transient ischemic attack Sister   . Colon cancer Neg Hx   . Esophageal cancer Neg Hx   . Stomach cancer Neg Hx   . Rectal cancer Neg Hx      Review of Systems: General: negative for chills, fever, night sweats or weight changes.  Cardiovascular: negative for dyspnea on exertion, edema, orthopnea, palpitations, paroxysmal nocturnal dyspnea or shortness of breath Dermatological: negative for rash Respiratory: negative for cough or wheezing Urologic: negative for hematuria Abdominal: negative for nausea, vomiting, diarrhea, bright red blood per rectum, melena, or hematemesis Neurologic: negative for visual changes, syncope, or dizziness All other systems reviewed and are otherwise negative except as noted above.    Blood pressure 118/62, pulse 60, height 5' 7"  (1.702 m), weight 176 lb 3.2 oz (79.9 kg), SpO2 98 %.  General appearance: alert, cooperative and no distress Neck: no carotid bruit and no JVD Lungs: clear to auscultation bilaterally Heart: regular rate and rhythm Abdomen: soft, non-tender; bowel sounds normal; no masses,  no organomegaly Extremities: extremities normal, atraumatic, no cyanosis or edema Pulses: 2+ and symmetric Rt radial site without hematoma Skin: Skin color, texture, turgor normal. No rashes or lesions Neurologic: Grossly normal  EKG NSR- no acute changes  ASSESSMENT AND PLAN:   Chest pain with moderate risk of acute coronary syndrome Pt seen in the office today with complaints of chest pain similar to his pre cath  symptoms  NSTEMI (non-ST elevated myocardial infarction) ([place]) NSTEMI by Troponin (0.68 peak) and symptoms 09/02/16  Family history of early CAD Brother had early CAD  Essential hypertension, benign Controlled  Dyslipidemia High dose statin Rx started  History of multiple sclerosis .   PLAN  Discussed with Dr Martinique in the office today. Cath films reviewed. Site of the lesion is concerning as is he continued symptoms. He feels it would be best to re study him with FFR. The pt's symptoms are actually almost gone in the office. He started Imdur last night. I offered to admit him today but he  preferred same day cath tomorrow. He knows to go to the ED if he has increased symptoms.   Kerin Ransom PA-C 09/06/2016 11:10 AM

## 2016-09-08 ENCOUNTER — Other Ambulatory Visit: Payer: Self-pay

## 2016-09-08 ENCOUNTER — Encounter (HOSPITAL_COMMUNITY): Payer: Self-pay | Admitting: Physician Assistant

## 2016-09-08 DIAGNOSIS — I200 Unstable angina: Secondary | ICD-10-CM

## 2016-09-08 DIAGNOSIS — I25110 Atherosclerotic heart disease of native coronary artery with unstable angina pectoris: Secondary | ICD-10-CM | POA: Diagnosis not present

## 2016-09-08 DIAGNOSIS — R079 Chest pain, unspecified: Secondary | ICD-10-CM | POA: Diagnosis not present

## 2016-09-08 LAB — BASIC METABOLIC PANEL
ANION GAP: 6 (ref 5–15)
BUN: 9 mg/dL (ref 6–20)
CALCIUM: 8.4 mg/dL — AB (ref 8.9–10.3)
CO2: 28 mmol/L (ref 22–32)
Chloride: 105 mmol/L (ref 101–111)
Creatinine, Ser: 0.98 mg/dL (ref 0.61–1.24)
GFR calc non Af Amer: >60 mL/min (ref >60)
GLUCOSE: 94 mg/dL (ref 65–99)
POTASSIUM: 3.9 mmol/L (ref 3.5–5.1)
Sodium: 139 mmol/L (ref 135–145)

## 2016-09-08 LAB — CBC
HEMATOCRIT: 41.4 % (ref 39.0–52.0)
HEMOGLOBIN: 13.9 g/dL (ref 13.0–17.0)
MCH: 30.8 pg (ref 26.0–34.0)
MCHC: 33.6 g/dL (ref 30.0–36.0)
MCV: 91.8 fL (ref 78.0–100.0)
Platelets: 160 10*3/uL (ref 150–400)
RBC: 4.51 MIL/uL (ref 4.22–5.81)
RDW: 12.7 % (ref 11.5–15.5)
WBC: 6.6 10*3/uL (ref 4.0–10.5)

## 2016-09-08 NOTE — Discharge Instructions (Signed)

## 2016-09-08 NOTE — Discharge Summary (Signed)
Discharge Summary    Patient ID: John Kelly,  MRN: [id number], DOB/AGE: 52-Apr-1967 52 y.o.  Admit date: 09/07/2016 Discharge date: 09/08/2016  Primary Care Provider: Jinny Sanders Primary Cardiologist: Dr. Claiborne Billings   Discharge Diagnoses    Principal Problem:   Unstable angina [facility]) Active Problems:   Essential hypertension, benign   GERD   Family history of early CAD   Multiple sclerosis ([place])   Dyslipidemia   CAD (coronary artery disease)   Allergies Allergies  Allergen Reactions  . Diclofenac Sodium Shortness Of Breath and Swelling    respiratory difficulty  . Skelaxin [Metaxalone]     REACTION: Nausea/vomiting  . Trazodone And Nefazodone     Head felt funny      History of Present Illness     John Kelly is a 51 y.o. male with a history of multiple sclerosis, HTN, family hx of early CAD and recently diagnosed CAD who presented to [facility] on 09/07/16 for outpatient cath.   Admitted 6/16-6/18/18 for NSTEMI and was found to have 2 vessel coronary obstructive disease with ostial LAD stenosis of 60-70% and mild 20% proximal LAD stenosis, large left circumflex and RCA with 30% smooth proximal stenosis. It was thought that the patient may have had transient vasospasm of his ostial LAD stenosis contributing to the J-point elevation which normalized after short burst of idioventricular rhythm. Troponin peaked at 0.68.  Medical therapy was recommended. His diuretic was discontinued and beta blocker and amlodipine initiated, ARB continued. ASA/plavix added. He was discharged with outpatient follow-up however on 09/05/16 he called the office complaining of recurrent chest pressure similar to his preadmission symptoms but not as severe. Imdur was called in and he was added onto [facility] schedule on 09/06/16.  Given the site of his lesion, Dr. Martinique felt like he needed to be recathed with FFR. This was set up for 09/07/16  Hospital Course     Consultants:   Unstable angina:  Cath on 09/07/16 showed 80% ostial LAD lesion s/p PCTA/DES. He was continued on aspirin and Plavix which will be continued for at least one year. Continue beta blocker and statin. Will stop Imdur now that he has been stented and see how he does. Patient walked with cardiac rehabilitation today and did well. He is unsure if he wants to do phase II cardiac rehabilitation.  HTN: BP well controlled  HLD: continue statin   The patient has had an uncomplicated hospital course and is recovering well. The radial catheter site is stable. He has been seen by Dr. Marlou Porch today and deemed ready for discharge home. Has a previously scheduled appointment on 7/3. A work excuse note was provided as well. Discharge medications are listed below.  _____________  Discharge Vitals Blood pressure (!) 148/76, pulse 63, temperature 97.8 F (36.6 C), temperature source Oral, resp. rate (!) 21, height 5' 7"  (1.702 m), weight 190 lb 14.4 oz (86.6 kg), SpO2 99 %.  Filed Weights   09/07/16 1303 09/08/16 0230  Weight: 176 lb (79.8 kg) 190 lb 14.4 oz (86.6 kg)    Labs & Radiologic Studies     CBC  Recent Labs  09/06/16 1115 09/08/16 0420  WBC 6.5 6.6  HGB 14.9 13.9  HCT 44.0 41.4  MCV 92 91.8  PLT 198 347   Basic Metabolic Panel  Recent Labs  09/06/16 1115 09/08/16 0420  NA 142 139  K 5.1 3.9  CL 103 105  CO2 25 28  GLUCOSE 88 94  BUN 9 9  CREATININE 0.93 0.98  CALCIUM 9.2 8.4*   Liver Function Tests No results for input(s): AST, ALT, ALKPHOS, BILITOT, PROT, ALBUMIN in the last 72 hours. No results for input(s): LIPASE, AMYLASE in the last 72 hours. Cardiac Enzymes No results for input(s): CKTOTAL, CKMB, CKMBINDEX, TROPONINI in the last 72 hours. BNP Invalid input(s): POCBNP D-Dimer No results for input(s): DDIMER in the last 72 hours. Hemoglobin A1C No results for input(s): HGBA1C in the last 72 hours. Fasting Lipid Panel No results for input(s): CHOL, HDL, LDLCALC, TRIG, CHOLHDL,  LDLDIRECT in the last 72 hours. Thyroid Function Tests No results for input(s): TSH, T4TOTAL, T3FREE, THYROIDAB in the last 72 hours.  Invalid input(s): FREET3  Dg Chest 2 View  Result Date: 09/01/2016 CLINICAL DATA:  Chest pain EXAM: CHEST  2 VIEW COMPARISON:  03/21/2012 chest radiograph. FINDINGS: Stable cardiomediastinal silhouette with normal heart size. No pneumothorax. No pleural effusion. Lungs appear clear, with no acute consolidative airspace disease and no pulmonary edema. IMPRESSION: No active cardiopulmonary disease. Electronically Signed   By: Ilona Sorrel M.D.   On: 09/01/2016 17:39     Diagnostic Studies/Procedures   LHC 09/07/16 Conclusion    Prox RCA lesion, 20 %stenosed.  Ost Cx to Prox Cx lesion, 10 %stenosed.  A STENT RESOLUTE ONYX 4.0X8 drug eluting stent was successfully placed.  LM lesion, 80 %stenosed.  Post intervention, there is a 0% residual stenosis.   1. Unstable angina 2. Severe stenosis ostial LAD 3. Successful PTCA/DES x 1 ostial LAD  Recommendations: Will continue DAPT with ASA and Plavix for at least one year. Continue statin and beta blocker.      _____________    Disposition   Pt is being discharged home today in good condition.  Follow-up Plans & Appointments    Follow-up Information    Almyra Deforest, [place]. Go on 09/18/2016.   Specialties:  Cardiology, Radiology Why:  @ 10:30am Contact information: [address] [place] [address] [phone]            Discharge Medications     Medication List    STOP taking these medications   isosorbide mononitrate 60 MG 24 hr tablet Commonly known as:  IMDUR     TAKE these medications   acetaminophen 500 MG tablet Commonly known as:  TYLENOL Take 1,000 mg by mouth daily as needed for moderate pain or headache.   amLODipine 2.5 MG tablet Commonly known as:  NORVASC Take 1 tablet (2.5 mg total) by mouth daily.   aspirin 81 MG EC tablet Take 1 tablet  (81 mg total) by mouth daily.   atorvastatin 80 MG tablet Commonly known as:  LIPITOR Take 1 tablet (80 mg total) by mouth daily at 6 PM.   balsalazide 750 MG capsule Commonly known as:  COLAZAL Take 3 capsules (2,250 mg total) by mouth 3 (three) times daily.   clopidogrel 75 MG tablet Commonly known as:  PLAVIX Take 1 tablet (75 mg total) by mouth daily.   diphenhydrAMINE 25 MG tablet Commonly known as:  BENADRYL Take 50 mg by mouth daily as needed for allergies.   losartan 50 MG tablet Commonly known as:  COZAAR Take 1 tablet (50 mg total) by mouth daily.   mesalamine 1000 MG suppository Commonly known as:  CANASA Place 1 suppository (1,000 mg total) rectally 2 (two) times daily. What changed:  when to take this  reasons to take this   metoprolol tartrate 25 MG tablet Commonly  known as:  LOPRESSOR Take 0.5 tablets (12.5 mg total) by mouth 2 (two) times daily.   nitroGLYCERIN 0.4 MG SL tablet Commonly known as:  NITROSTAT Place 1 tablet (0.4 mg total) under the tongue every 5 (five) minutes x 3 doses as needed for chest pain.   OVER THE COUNTER MEDICATION Take 2 tablets by mouth once a week. Pre workout testosterone booster,   ranitidine 75 MG tablet Commonly known as:  ZANTAC Take 75 mg by mouth daily as needed for heartburn.   traZODone 50 MG tablet Commonly known as:  DESYREL TAKE ONE TABLET BY MOUTH AT BEDTIME AS NEEDED FOR SLEEP   Vitamin D3 5000 units Caps Take 5,000 Units by mouth daily.   ZZZQUIL PO Take 1 Dose by mouth at bedtime as needed (sleep).          Outstanding Labs/Studies   none  Duration of Discharge Encounter   Greater than 30 minutes including physician time.  Signed, Angelena Form PA-C 09/08/2016, 9:04 AM  Personally seen and examined. Agree with above.  Ambulating well, no angina Cath site c/d/i normal pulse, RRR, CTAB, alert.  Explained prox LAD stent.  DAPT Cardiac rehab discussion Out of work OK until July 3  visit.  Stopping imdur (mild HA) Continue Bb, statin, ARB  Candee Furbish, MD

## 2016-09-08 NOTE — Progress Notes (Signed)
CARDIAC REHAB PHASE I   PRE:  Rate/Rhythm: 70 SR  BP:  Sitting: 148/76    MODE:  Ambulation: 600 ft   POST:  Rate/Rhythm: 85 SR  BP:  Sitting: 173/88       Pt ambulated 600 ft on RA, independent, steady gait, tolerated well with no complaints.  Completed MI/stent education with pt and significant other at bedside.  Reviewed risk factors, MI book, anti-platelet therapy, stent card, activity restrictions, ntg, exercise, heart healthy diet and phase 2 cardiac rehab. Pt verbalized understanding, receptive to education. Pt agrees to phase 2 cardiac rehab referral, will send to [facility] per pt request. Pt to bed per pt request after walk, call bell within reach.   [id number] Lenna Sciara, RN, BSN 09/08/2016 9:06 AM

## 2016-09-08 NOTE — Progress Notes (Signed)
TR BAND REMOVAL  LOCATION:   R radial  DEFLATED PER PROTOCOL:   Yes   TIME BAND OFF / DRESSING APPLIED:    19:40  SITE UPON ARRIVAL:    Level 0  SITE AFTER BAND REMOVAL:    Level  0  CIRCULATION SENSATION AND MOVEMENT:    Within Normal Limits   Yes  COMMENTS:  Pt tolerated deflation and removal well. VSS

## 2016-09-10 ENCOUNTER — Telehealth (HOSPITAL_COMMUNITY): Payer: Self-pay

## 2016-09-10 NOTE — Telephone Encounter (Signed)
Patient insurance is active and benefits verified. Patient has [employer] - $25.00 co-payment, deductible $500/$25.52 has been met, out of pocket $4000/$276.12 has been met, no co-insurance, 40 visits covered. Passport/reference [id number]. Website is showing patient needs authorizations for cardiac rehab.   Information given to Car[place] to follow up with referring office.   Patient will be contacted and scheduled after authorization is received and after their follow up appointment with the cardiologist on 09/18/16, upon review by the [facility] RN navigator.

## 2016-09-17 ENCOUNTER — Ambulatory Visit: Payer: Managed Care, Other (non HMO) | Admitting: Physician Assistant

## 2016-09-18 ENCOUNTER — Encounter: Payer: Self-pay | Admitting: Physician Assistant

## 2016-09-18 ENCOUNTER — Ambulatory Visit (INDEPENDENT_AMBULATORY_CARE_PROVIDER_SITE_OTHER): Payer: Managed Care, Other (non HMO) | Admitting: Physician Assistant

## 2016-09-18 VITALS — BP 128/70 | HR 66 | Ht 67.0 in | Wt 174.0 lb

## 2016-09-18 DIAGNOSIS — I10 Essential (primary) hypertension: Secondary | ICD-10-CM | POA: Diagnosis not present

## 2016-09-18 DIAGNOSIS — I2510 Atherosclerotic heart disease of native coronary artery without angina pectoris: Secondary | ICD-10-CM

## 2016-09-18 DIAGNOSIS — Z79899 Other long term (current) drug therapy: Secondary | ICD-10-CM | POA: Diagnosis not present

## 2016-09-18 DIAGNOSIS — E785 Hyperlipidemia, unspecified: Secondary | ICD-10-CM | POA: Diagnosis not present

## 2016-09-18 DIAGNOSIS — K219 Gastro-esophageal reflux disease without esophagitis: Secondary | ICD-10-CM

## 2016-09-18 NOTE — Progress Notes (Signed)
Cardiology Office Note    Date:  09/18/2016   ID:  John Kelly, DOB 22-Feb-1966, MRN [id number]  PCP:  Jinny Sanders, MD  Cardiologist:  Dr. Claiborne Billings   Chief Complaint  Patient presents with  . Follow-up    seen for Dr. Claiborne Billings    History of Present Illness:  John Kelly is a 51 y.o. male with PMH of hypertension, anxiety, CAD and GERD. He initially was admitted for NSTEMI on 09/01/2016, cardiac catheterization at the time showed 60-70% ostial LAD lesion, 20% mid LAD lesion, 30% RCA lesion. He was placed on aspirin and Plavix. However after discharge, he had recurrence of the symptom. He underwent repeat cardiac catheterization on 09/07/2016, this revealed 80% ostial LAD lesion that was treated with 4.0 x 8 mm Onyx Resolute DES.  Since discharge, he has not had any significant chest pain. He does have a burning sensation last night, this is in the setting of skipped Zantac. He says the burning sensation does not feel like his previous angina. EKG showed no significant changes. We have emphasized on the need to be compliant with aspirin and Plavix. Otherwise he has significant family history of CAD. I have filled out his insurance paperwork to go back to work as his cardiac catheterization was 2 weeks ago. He is recovering quite well. He is on the high-dose statin, he will need a fasting lipid panel and LFTs 6 weeks.   Past Medical History:  Diagnosis Date  . Abnormal liver function test   . Allergic rhinitis   . Allergy   . Anxiety   . CAD (coronary artery disease) 09/01/2016   a.  09/01/16; NSTEMI LHC: 60-70% ostial LAD stenosis, 20% mid LAD, 30% RCA. EF 55-65%  b. 09/07/16 [place] s/p DES to oLAD  . DDD (degenerative disc disease)   . Depression   . GERD (gastroesophageal reflux disease)   . Hypertension    no meds  . Insomnia   . MS (multiple sclerosis) ([place])   . Ulcerative colitis    left sided    Past Surgical History:  Procedure Laterality Date  . CORONARY ANGIOGRAPHY N/A  09/07/2016   Procedure: Coronary Angiography;  Surgeon: Burnell Blanks, MD;  Location: [place] [facility];  Service: Cardiovascular;  Laterality: N/A;  . CORONARY STENT INTERVENTION N/A 09/07/2016   Procedure: Coronary Stent Intervention;  Surgeon: Burnell Blanks, MD;  Location: [place] [facility];  Service: Cardiovascular;  Laterality: N/A;  . Inguinal Herniorrhapy     Right and left  . LEFT HEART CATH AND CORONARY ANGIOGRAPHY N/A 09/01/2016   Procedure: Left Heart Cath and Coronary Angiography;  Surgeon: Troy Sine, MD;  Location: [place] [facility];  Service: Cardiovascular;  Laterality: N/A;  . LEFT HEART CATH AND CORONARY ANGIOGRAPHY N/A 09/07/2016   Procedure: Left Heart Cath and Coronary Angiography;  Surgeon: Burnell Blanks, MD;  Location: [place] [facility];  Service: Cardiovascular;  Laterality: N/A;  . PYLOROPLASTY      Current Medications: Outpatient Medications Prior to Visit  Medication Sig Dispense Refill  . acetaminophen (TYLENOL) 500 MG tablet Take 1,000 mg by mouth daily as needed for moderate pain or headache.    Marland Kitchen amLODipine (NORVASC) 2.5 MG tablet Take 1 tablet (2.5 mg total) by mouth daily. 30 tablet 5  . aspirin 81 MG EC tablet Take 1 tablet (81 mg total) by mouth daily. 30 tablet 11  . atorvastatin (LIPITOR) 80 MG tablet Take 1 tablet (80 mg  total) by mouth daily at 6 PM. 30 tablet 5  . balsalazide (COLAZAL) 750 MG capsule Take 3 capsules (2,250 mg total) by mouth 3 (three) times daily. 810 capsule 3  . Cholecalciferol (VITAMIN D3) 5000 units CAPS Take 5,000 Units by mouth daily.    . clopidogrel (PLAVIX) 75 MG tablet Take 1 tablet (75 mg total) by mouth daily. 30 tablet 11  . diphenhydrAMINE (BENADRYL) 25 MG tablet Take 50 mg by mouth daily as needed for allergies.    . DiphenhydrAMINE HCl, Sleep, (ZZZQUIL PO) Take 1 Dose by mouth at bedtime as needed (sleep).    . losartan (COZAAR) 50 MG tablet Take 1 tablet (50 mg total) by mouth daily.  30 tablet 5  . mesalamine (CANASA) 1000 MG suppository Place 1 suppository (1,000 mg total) rectally 2 (two) times daily. (Patient taking differently: Place 1,000 mg rectally as needed (ulcerative colitis flare). ) 60 suppository 1  . metoprolol tartrate (LOPRESSOR) 25 MG tablet Take 0.5 tablets (12.5 mg total) by mouth 2 (two) times daily. 30 tablet 5  . nitroGLYCERIN (NITROSTAT) 0.4 MG SL tablet Place 1 tablet (0.4 mg total) under the tongue every 5 (five) minutes x 3 doses as needed for chest pain. 25 tablet 6  . OVER THE COUNTER MEDICATION Take 2 tablets by mouth once a week. Pre workout testosterone booster,    . ranitidine (ZANTAC) 75 MG tablet Take 75 mg by mouth daily as needed for heartburn.    . traZODone (DESYREL) 50 MG tablet TAKE ONE TABLET BY MOUTH AT BEDTIME AS NEEDED FOR SLEEP 90 tablet 1   No facility-administered medications prior to visit.      Allergies:   Diclofenac sodium; Skelaxin [metaxalone]; and Trazodone and nefazodone   Social History   Social History  . Marital status: Divorced    Spouse name: N/A  . Number of children: 2  . Years of education: N/A   Occupational History  . 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 Smoking status: Never Smoker  . Smokeless tobacco: Never Used  . Alcohol use 0.0 oz/week     Comment: rarely  . Drug use: No  . Sexual activity: Not Currently   Other Topics Concern  . None   Social History Narrative   HSG   Divorced, not sexually active   1 son- '92, 1 daughter - '97   Work: Lobbyist- shipping/ rec'ing; quality management   Patient has never smoked   Alcohol use- rare 1-2 a month   Illicit Drug use- no    Patient gets regular exercise.     Family History:  The patient's family history includes Colitis in his mother; Coronary artery disease in his brother, father, and mother; Diabetes in his brother and father; Heart attack in his father and mother; Heart attack (age of onset: 23)  in his brother; Hyperlipidemia in his father and mother; Hypertension in his brother, father, mother, and sister; Transient ischemic attack in his sister.   ROS:   Please see the history of present illness.    ROS All other systems reviewed and are negative.   PHYSICAL EXAM:   VS:  BP 128/70   Pulse 66   Ht 5' 7"  (1.702 m)   Wt 174 lb (78.9 kg)   BMI 27.25 kg/m    GEN: Well nourished, well developed, in no acute distress  HEENT: normal  Neck: no JVD, carotid bruits, or masses Cardiac: RRR; no murmurs, rubs,  or gallops,no edema  Respiratory:  clear to auscultation bilaterally, normal work of breathing GI: soft, nontender, nondistended, + BS MS: no deformity or atrophy  Skin: warm and dry, no rash Neuro:  Alert and Oriented x 3, Strength and sensation are intact Psych: euthymic mood, full affect  Wt Readings from Last 3 Encounters:  09/18/16 174 lb (78.9 kg)  09/08/16 190 lb 14.4 oz (86.6 kg)  09/06/16 176 lb 3.2 oz (79.9 kg)      Studies/Labs Reviewed:   EKG:  EKG is ordered today.  The ekg ordered today demonstrates Normal sinus rhythm, very mild biphasic T-wave in V3 through V5. No significant change to suggest ischemia.  Recent Labs: 04/10/2016: ALT 25 09/08/2016: BUN 9; Creatinine, Ser 0.98; Hemoglobin 13.9; Platelets 160; Potassium 3.9; Sodium 139   Lipid Panel    Component Value Date/Time   CHOL 194 04/10/2016 1037   TRIG 79.0 04/10/2016 1037   HDL 47.70 04/10/2016 1037   CHOLHDL 4 04/10/2016 1037   VLDL 15.8 04/10/2016 1037   LDLCALC 130 (H) 04/10/2016 1037   LDLDIRECT 147.0 12/10/2011 1702    Additional studies/ records that were reviewed today include:   Cath 09/01/2016 Conclusion     Ost LAD lesion, 60 - 70%  Proximal LAD, 20%  Proximal RCA 30%  Ost RCA to Prox RCA lesion, 30 %stenosed.  The left ventricular ejection fraction is 55-65% by visual estimate.  The left ventricular systolic function is normal.  LV end diastolic pressure is  normal.   Preserved LV function with an ejection fraction at 60% with a subtle area of very mild focal mid anterolateral hypocontractility.  Two-vessel coronary obstructive disease with ostial LAD stenosis of 60-70%, which improved slightly following IC nitroglycerin administration and mild 20% proximal LAD stenosis.  Normal co-dominant large left circumflex coronary artery, and RCA with 30% smooth proximal stenosis.  RECOMMENDATION: I suspect the patient today may have had transient vasospasm of his ostial LAD stenosis contributing to J-point elevation in leads V3 to V5 which normalized following a short burst of idioventricular rhythm.  He was pain-free on IV nitro drip.  At present, will attempt initial medical therapy trial and will discontinue his diuretic and initiate beta blocker and amlodipine.  He will be started on atorvastatin 80 mg in attempt to induce plaque regression.  If the lesion progresses, a LIMA to LAD may be necessary since the lesion may not be amenable to stenting due to a very short left main, and large circumflex without potentially jailing the circumflex vessel.     Cath 09/07/2016 Conclusion     Prox RCA lesion, 20 %stenosed.  Ost Cx to Prox Cx lesion, 10 %stenosed.  A STENT RESOLUTE ONYX 4.0X8 drug eluting stent was successfully placed.  LM lesion, 80 %stenosed.  Post intervention, there is a 0% residual stenosis.   1. Unstable angina 2. Severe stenosis ostial LAD 3. Successful PTCA/DES x 1 ostial LAD  Recommendations: Will continue DAPT with ASA and Plavix for at least one year. Continue statin and beta blocker.       ASSESSMENT:    1. Coronary artery disease involving native coronary artery of native heart without angina pectoris   2. Essential hypertension, benign   3. Encounter for long-term (current) use of medications   4. Dyslipidemia   5. Gastroesophageal reflux disease without esophagitis      PLAN:  In order of problems listed  above:  1. CAD: Underwent ostial LAD stent recently. Has not had any  significant chest discomfort since. He did have an episode of burning sensation last night in the chest, this sounds more like acid reflux than his typical symptom. We have emphasized the importance of dual antiplatelet medication.  2. Hypertension: Blood pressure stable today 128/70. He is on losartan and metoprolol. He is also on 2.5 mg daily of amlodipine, I think this is related to the fact that there was some question of coronary spasm on the initial cardiac catheterization.  3. Hyperlipidemia: On high-dose statin, he will need a fasting lipid panel and LFTs in 6 weeks.    Medication Adjustments/Labs and Tests Ordered: Current medicines are reviewed at length with the patient today.  Concerns regarding medicines are outlined above.  Medication changes, Labs and Tests ordered today are listed in the Patient Instructions below. Patient Instructions  Medication Instructions:   No change  Labwork:   Fasting labs (cholesterol and liver function tests) in 6 weeks (on or around August 20th)  Testing/Procedures:  none  Follow-Up:  With Dr. Claiborne Billings in 2-3 months.  If you need a refill on your cardiac medications before your next appointment, please call your pharmacy.      Hilbert Corrigan, [place]  09/18/2016 5:49 PM    [facility] [address], [place], [place]  [address] Phone: [phone]; Fax: [phone]

## 2016-09-18 NOTE — Patient Instructions (Addendum)
Medication Instructions:   No change  Labwork:   Fasting labs (cholesterol and liver function tests) in 6 weeks (on or around August 20th)  Testing/Procedures:  none  Follow-Up:  With Dr. Claiborne Billings in 2-3 months.  If you need a refill on your cardiac medications before your next appointment, please call your pharmacy.

## 2016-09-21 ENCOUNTER — Telehealth (HOSPITAL_COMMUNITY): Payer: Self-pay

## 2016-09-21 NOTE — Telephone Encounter (Signed)
I called and spoke to patient about scheduling for cardiac rehab. Patient declined. Referral canceled.

## 2016-11-22 ENCOUNTER — Telehealth: Payer: Self-pay | Admitting: Cardiovascular Disease

## 2016-11-22 NOTE — Telephone Encounter (Signed)
Returned call to patient.  Advised to have FASTING labs drawn at our office, hours and location given.  Aware not appt needed.  Patient states he will have labs completed tomorrow.

## 2016-11-22 NOTE — Telephone Encounter (Signed)
Pt said he is supposed to get lab work,he lost his lab order. Also wants to know where can he go to have it?

## 2016-11-23 LAB — LIPID PANEL
CHOLESTEROL TOTAL: 140 mg/dL (ref 100–199)
Chol/HDL Ratio: 3.3 ratio (ref 0.0–5.0)
HDL: 43 mg/dL (ref >39)
LDL CALC: 79 mg/dL (ref 0–99)
Triglycerides: 89 mg/dL (ref 0–149)
VLDL CHOLESTEROL CAL: 18 mg/dL (ref 5–40)

## 2016-11-23 LAB — HEPATIC FUNCTION PANEL
ALK PHOS: 67 IU/L (ref 39–117)
ALT: 45 IU/L — AB (ref 0–44)
AST: 30 IU/L (ref 0–40)
Albumin: 4.4 g/dL (ref 3.5–5.5)
Bilirubin Total: 1.1 mg/dL (ref 0.0–1.2)
Bilirubin, Direct: 0.3 mg/dL (ref 0.00–0.40)
Total Protein: 6.4 g/dL (ref 6.0–8.5)

## 2016-11-27 ENCOUNTER — Telehealth: Payer: Self-pay | Admitting: *Deleted

## 2016-11-27 DIAGNOSIS — Z79899 Other long term (current) drug therapy: Secondary | ICD-10-CM

## 2016-11-27 DIAGNOSIS — E785 Hyperlipidemia, unspecified: Secondary | ICD-10-CM

## 2016-11-27 MED ORDER — EZETIMIBE 10 MG PO TABS: 10.0000 mg | ORAL_TABLET | Freq: Every day | ORAL | 5 refills | Status: DC

## 2016-11-27 NOTE — Telephone Encounter (Signed)
-----   Message from [place], [place] sent at 11/25/2016 11:51 AM EDT ----- Cholesterol very well except bad cholesterol (LDL) is still borderline high, goal is < 70. Given the location of blockage, I recommend add Zetia 2m daily for more aggressive cholesterol control. If patient is agreeable, he will need FLP and LFT in 2 month.

## 2016-12-03 ENCOUNTER — Other Ambulatory Visit: Payer: Self-pay | Admitting: Gastroenterology

## 2016-12-10 ENCOUNTER — Telehealth: Payer: Self-pay | Admitting: Gastroenterology

## 2016-12-10 MED ORDER — MESALAMINE 1000 MG RE SUPP: 1000.0000 mg | Freq: Two times a day (BID) | RECTAL | 1 refills | Status: DC

## 2016-12-10 NOTE — Telephone Encounter (Signed)
Prescription for Canasa suppositories sent to [facility] on [address] until scheduled appt in November.

## 2016-12-19 ENCOUNTER — Encounter: Payer: Self-pay | Admitting: Cardiovascular Disease

## 2016-12-19 ENCOUNTER — Ambulatory Visit (INDEPENDENT_AMBULATORY_CARE_PROVIDER_SITE_OTHER): Payer: Managed Care, Other (non HMO) | Admitting: Cardiovascular Disease

## 2016-12-19 VITALS — BP 136/80 | HR 69 | Ht 67.0 in | Wt 177.4 lb

## 2016-12-19 DIAGNOSIS — E785 Hyperlipidemia, unspecified: Secondary | ICD-10-CM | POA: Diagnosis not present

## 2016-12-19 DIAGNOSIS — I10 Essential (primary) hypertension: Secondary | ICD-10-CM

## 2016-12-19 DIAGNOSIS — I2510 Atherosclerotic heart disease of native coronary artery without angina pectoris: Secondary | ICD-10-CM | POA: Diagnosis not present

## 2016-12-19 DIAGNOSIS — G35 Multiple sclerosis: Secondary | ICD-10-CM

## 2016-12-19 DIAGNOSIS — Z8669 Personal history of other diseases of the nervous system and sense organs: Secondary | ICD-10-CM

## 2016-12-19 DIAGNOSIS — R079 Chest pain, unspecified: Secondary | ICD-10-CM | POA: Diagnosis not present

## 2016-12-19 DIAGNOSIS — Z9861 Coronary angioplasty status: Secondary | ICD-10-CM

## 2016-12-19 MED ORDER — AMLODIPINE BESYLATE 5 MG PO TABS: 5.0000 mg | ORAL_TABLET | Freq: Every day | ORAL | 3 refills | Status: DC

## 2016-12-19 NOTE — Patient Instructions (Addendum)
Medication Instructions:  INCREASE amlodipine (Norvasc) to 5 mg daily-a new prescription has been sent to your pharmacy.  Testing/Procedures: Your physician has requested that you have an exercise stress myoview. For further information please visit [email or web address]. Please follow instruction sheet, as given.  Follow-Up: Your physician recommends that you schedule a follow-up appointment in: 4 weeks with Dr. Claiborne Billings.   Any Other Special Instructions Will Be Listed Below (If Applicable).     If you need a refill on your cardiac medications before your next appointment, please call your pharmacy.

## 2016-12-19 NOTE — Progress Notes (Signed)
Cardiology Office Note    Date:  12/25/2016   ID:  John Kelly, DOB 30-Nov-1965, MRN [id number]  PCP:  Jinny [facility], MD  Cardiologist:  Shelva Majestic, MD   Chief Complaint  Patient presents with  . Chest Pain    pt states some tightness and burning   . Shortness of Breath    states being SOB earlier in the morning had to take a Nitro     History of Present Illness:  John Kelly is a 51 y.o. male who presents for his initial post hospital evaluation with me following his interventions in June 2018.  John Kelly has history of hypertension and multiple sclerosis.  On 09/01/2016 while working in the yard in significant heat.  He began to notice new onset chest pressure.  He presented to the ER was found to have 0.5 mm J-point elevation in leads V3 through V6.  He develop new transient right bundle branch block.  An idioventricular rhythm.  He continued to have chest pain which ultimately improved with IV nitroglycerin and heparin.  Troponin was mildly elevated.  He was taken to the catheterization laboratory and was found to preserved LV function with an EF of 60% with subtle area of mild focal mid anterolateral hypocontractility.  There was an ostial LAD stenosis of 60-70%, which improved slightly following IC nitroglycerin administration and he had mild 20% proximal LAD stenoses.  Had a normal codominant large left circumflex flex coronary artery.  His RCA at 30% smooth proximal stenosis.  Since he was pain-free and had normal flow.  Initial plan was to attempt medical therapy with discontinuance of his diuretic initiation of beta blocker and amlodipine in addition to atorvastatin 80 mg.  He developed recurrent chest pain and on June 22 underwent stenting to his LAD ostium by Dr. Julianne Handler with insertion of a 4.08 mm Resolute onyx stent.  There was slight plaque shift into the ostium of the circumflex, but excellent flow down both vessels.  He was discharged following day.  He was was seen  for office evaluation on 09/18/2016 by John Deforest, PA.  Laboratory in September showed improvement in his lipids with total cholesterol 140, LDL 79, triglycerides 89, HDL 43.  He has been on atorvastatin 80 mg and Zetia 10 mg was added to his regimen.  Over the past several weeks he has noticed some mild lightheadedness with bending over.  He also has experienced some nonexertional chest pain over the past several weeks, but he is uncertain if this was related to his multiple sclerosis.  He presents for evaluation    Past Medical History:  Diagnosis Date  . Abnormal liver function test   . Allergic rhinitis   . Allergy   . Anxiety   . CAD (coronary artery disease) 09/01/2016   a.  09/01/16; NSTEMI LHC: 60-70% ostial LAD stenosis, 20% mid LAD, 30% RCA. EF 55-65%  b. 09/07/16 [place] s/p DES to oLAD  . DDD (degenerative disc disease)   . Depression   . GERD (gastroesophageal reflux disease)   . Hypertension    no meds  . Insomnia   . MS (multiple sclerosis) ([place])   . Ulcerative colitis    left sided    Past Surgical History:  Procedure Laterality Date  . CORONARY ANGIOGRAPHY N/A 09/07/2016   Procedure: Coronary Angiography;  Surgeon: Burnell Blanks, MD;  Location: [facility];  Service: Cardiovascular;  Laterality: N/A;  . CORONARY STENT INTERVENTION N/A 09/07/2016  Procedure: Coronary Stent Intervention;  Surgeon: Burnell Blanks, MD;  Location: [facility];  Service: Cardiovascular;  Laterality: N/A;  . Inguinal Herniorrhapy     Right and left  . LEFT HEART CATH AND CORONARY ANGIOGRAPHY N/A 09/01/2016   Procedure: Left Heart Cath and Coronary Angiography;  Surgeon: Troy Sine, MD;  Location: [facility];  Service: Cardiovascular;  Laterality: N/A;  . LEFT HEART CATH AND CORONARY ANGIOGRAPHY N/A 09/07/2016   Procedure: Left Heart Cath and Coronary Angiography;  Surgeon: Burnell Blanks, MD;  Location: [facility];  Service:  Cardiovascular;  Laterality: N/A;  . PYLOROPLASTY      Current Medications: Outpatient Medications Prior to Visit  Medication Sig Dispense Refill  . acetaminophen (TYLENOL) 500 MG tablet Take 1,000 mg by mouth daily as needed for moderate pain or headache.    Marland Kitchen aspirin 81 MG EC tablet Take 1 tablet (81 mg total) by mouth daily. 30 tablet 11  . atorvastatin (LIPITOR) 80 MG tablet Take 1 tablet (80 mg total) by mouth daily at 6 PM. 30 tablet 5  . balsalazide (COLAZAL) 750 MG capsule Take 3 capsules (2,250 mg total) by mouth 3 (three) times daily. 810 capsule 3  . Cholecalciferol (VITAMIN D3) 5000 units CAPS Take 5,000 Units by mouth daily.    . clopidogrel (PLAVIX) 75 MG tablet Take 1 tablet (75 mg total) by mouth daily. 30 tablet 11  . diphenhydrAMINE (BENADRYL) 25 MG tablet Take 50 mg by mouth daily as needed for allergies.    . DiphenhydrAMINE HCl, Sleep, (ZZZQUIL PO) Take 1 Dose by mouth at bedtime as needed (sleep).    . ezetimibe (ZETIA) 10 MG tablet Take 1 tablet (10 mg total) by mouth daily. 30 tablet 5  . losartan (COZAAR) 50 MG tablet Take 1 tablet (50 mg total) by mouth daily. 30 tablet 5  . mesalamine (CANASA) 1000 MG suppository Place 1 suppository (1,000 mg total) rectally 2 (two) times daily. 60 suppository 1  . metoprolol tartrate (LOPRESSOR) 25 MG tablet Take 0.5 tablets (12.5 mg total) by mouth 2 (two) times daily. 30 tablet 5  . nitroGLYCERIN (NITROSTAT) 0.4 MG SL tablet Place 1 tablet (0.4 mg total) under the tongue every 5 (five) minutes x 3 doses as needed for chest pain. 25 tablet 6  . OVER THE COUNTER MEDICATION Take 2 tablets by mouth once a week. Pre workout testosterone booster,    . ranitidine (ZANTAC) 75 MG tablet Take 75 mg by mouth daily as needed for heartburn.    . traZODone (DESYREL) 50 MG tablet TAKE ONE TABLET BY MOUTH AT BEDTIME AS NEEDED FOR SLEEP 90 tablet 1  . amLODipine (NORVASC) 2.5 MG tablet Take 1 tablet (2.5 mg total) by mouth daily. 30 tablet 5    No facility-administered medications prior to visit.      Allergies:   Diclofenac sodium; Skelaxin [metaxalone]; and Trazodone and nefazodone   Social History   Social History  . Marital status: Divorced    Spouse name: N/A  . Number of children: 2  . Years of education: N/A   Occupational History  . 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 Smoking status: Never Smoker  . Smokeless tobacco: Never Used  . Alcohol use 0.0 oz/week     Comment: rarely  . Drug use: No  . Sexual activity: Not Currently   Other Topics Concern  . None   Social History Narrative  HSG   Divorced, not sexually active   1 son- '92, 1 daughter - '97   Work: Lobbyist- shipping/ rec'ing; quality management   Patient has never smoked   Alcohol use- rare 1-2 a month   Illicit Drug use- no    Patient gets regular exercise.     Family History:  The patient's family history includes Colitis in his mother; Coronary artery disease in his brother, father, and mother; Diabetes in his brother and father; Heart attack in his father and mother; Heart attack (age of onset: 27) in his brother; Hyperlipidemia in his father and mother; Hypertension in his brother, father, mother, and sister; Transient ischemic attack in his sister.   ROS General: Negative; No fevers, chills, or night sweats;  HEENT: Negative; No changes in vision or hearing, sinus congestion, difficulty swallowing Pulmonary: Negative; No cough, wheezing, shortness of breath, hemoptysis Cardiovascular: See history of present illness GI: Negative; No nausea, vomiting, diarrhea, or abdominal pain GU: Negative; No dysuria, hematuria, or difficulty voiding Musculoskeletal: Negative; no myalgias, joint pain, or weakness Hematologic/Oncology: Negative; no easy bruising, bleeding Endocrine: Negative; no heat/cold intolerance; no diabetes Neuro: Positive for multiple sclerosis Skin: Negative; No rashes or skin  lesions Psychiatric: Negative; No behavioral problems, depression Sleep: Negative; No snoring, daytime sleepiness, hypersomnolence, bruxism, restless legs, hypnogognic hallucinations, no cataplexy Other comprehensive 14 point system review is negative.   PHYSICAL EXAM:   VS:  BP 136/80   Pulse 69   Ht _0  (1.702 m)   Wt 177 lb 6.4 oz (80.5 kg)   BMI 27.78 kg/m     Repeat blood pressure by me was 150/84 supine and 150/84 standing  Wt Readings from Last 3 Encounters:  12/19/16 177 lb 6.4 oz (80.5 kg)  09/18/16 174 lb (78.9 kg)  09/08/16 190 lb 14.4 oz (86.6 kg)    General: Alert, oriented, no distress.  Skin: normal turgor, no rashes, warm and dry HEENT: Normocephalic, atraumatic. Pupils equal round and reactive to light; sclera anicteric; extraocular muscles intact; Fundi No hemorrhages or exudates Nose without nasal septal hypertrophy Mouth/Parynx benign; Mallinpatti scale 3 Neck: No JVD, no carotid bruits; normal carotid upstroke Lungs: clear to ausculatation and percussion; no wheezing or rales Chest wall: without tenderness to palpitation Heart: PMI not displaced, RRR, s1 s2 normal, 1/6 systolic murmur, no diastolic murmur, no rubs, gallops, thrills, or heaves Abdomen: soft, nontender; no hepatosplenomehaly, BS+; abdominal aorta nontender and not dilated by palpation. Back: no CVA tenderness Pulses 2+ Musculoskeletal: full range of motion, normal strength, no joint deformities Extremities: no clubbing cyanosis or edema, Homan's sign negative  Neurologic: grossly nonfocal; Cranial nerves grossly wnl Psychologic: Normal mood and affect   Studies/Labs Reviewed:   EKG:  EKG is ordered today. Normal sinus rhythm at 69 bpm.  Normal intervals.  No ectopy.  Nondagnostic T changes in lead 3.  Recent Labs: BMP Latest Ref Rng & Units 09/08/2016 09/06/2016 09/02/2016  Glucose 65 - 99 mg/dL 94 88 81  BUN 6 - 20 mg/dL _1 Creatinine 0.61 - 1.24 mg/dL 0.98 0.93 0.94  BUN/Creat  Ratio 9 - 20 - 10 -  Sodium 135 - 145 mmol/L 139 142 136  Potassium 3.5 - 5.1 mmol/L 3.9 5.1 3.6  Chloride 101 - 111 mmol/L 105 103 104  CO2 22 - 32 mmol/L _2 Calcium 8.9 - 10.3 mg/dL 8.4(L) 9.2 8.7(L)     Hepatic Function Latest Ref Rng & Units 11/23/2016 04/10/2016 12/04/2013  Total Protein 6.0 - 8.5 g/dL 6.4 6.7 6.6  Albumin 3.5 - 5.5 g/dL 4.4 4.4 3.9  AST 0 - 40 IU/L _0 ALT 0 - 44 IU/L 45(H) 25 27  Alk Phosphatase 39 - 117 IU/L 67 67 48  Total Bilirubin 0.0 - 1.2 mg/dL 1.1 1.1 0.6  Bilirubin, Direct 0.00 - 0.40 mg/dL 0.30 - -    CBC Latest Ref Rng & Units 09/08/2016 09/06/2016 09/03/2016  WBC 4.0 - 10.5 K/uL 6.6 6.5 8.1  Hemoglobin 13.0 - 17.0 g/dL 13.9 14.9 15.1  Hematocrit 39.0 - 52.0 % 41.4 44.0 45.5  Platelets 150 - 400 K/uL 160 198 190   Lab Results  Component Value Date   MCV 91.8 09/08/2016   MCV 92 09/06/2016   MCV 91.5 09/03/2016   Lab Results  Component Value Date   TSH 2.29 12/04/2013   No results found for: HGBA1C   BNP No results found for: BNP  ProBNP No results found for: PROBNP   Lipid Panel     Component Value Date/Time   CHOL 140 11/23/2016 1121   TRIG 89 11/23/2016 1121   HDL 43 11/23/2016 1121   CHOLHDL 3.3 11/23/2016 1121   CHOLHDL 4 04/10/2016 1037   VLDL 15.8 04/10/2016 1037   LDLCALC 79 11/23/2016 1121   LDLDIRECT 147.0 12/10/2011 1702     RADIOLOGY: No results found.   Additional studies/ records that were reviewed today include:  I reviewed the patient's June 2018 hospitalization, his 2 cardiac catheterization reports, discharge summary, and subsequent initial office visit with John Kelly, PAC.    ASSESSMENT:    1. CAD S/P percutaneous coronary angioplasty   2. Chest pain, unspecified type   3. Essential hypertension, benign   4. Hyperlipidemia LDL goal <70   5. History of multiple sclerosis      PLAN:  Mr. Montrae Braithwaite is a 51 year old gentleman who presented with unstable angina and may have had  transient vasospasm of his ostial LAD contribute to J-point elevations initially in leads V3 2V's 5, which normalized.  Following a short burst of idioventricular rhythm and with treatment of IV heparin and nitroglycerin.  He developed recurrent chest pain symptomatology and ultimately underwent stenting of his LAD ostium.  Several days later.  He was started on atorvastatin 80 mg and has been maintained on aspirin and Plavix without bleeding.  He is a history of hypertension and has been on losartan 50 mg in addition to amlodipine 2.5 mg and metoprolol 12.5 mg twice a day.  His blood pressure today is elevated.  I am further titrating amlodipine to 5 mg daily.  He has experienced several recurrent episodes of chest pain over the past several weeks which he is uncertain is related to his multiple sclerosis or his heart.  He also has noticed some mild lightheaded Ms. when he bends over.  His ECG today shows sinus rhythm without ectopy or ECG changes.  Presently, I am recommending he undergo an exercise Myoview study for further evaluation of his chest discomfort.  Zetia was recently added to his atorvastatin 80 mg in attempt to be very aggressive with lipid therapy.  If he is unable to reach target below 70 he may be a candidate for PCSK9 inhibition.  I will contact him regarding the results of his stress test and will see him in follow-up in 4 weeks for reevaluation.   Medication Adjustments/Labs and Tests Ordered: Current medicines are reviewed at length with the patient today.  Concerns regarding medicines are outlined above.  Medication changes, Labs and Tests ordered today are listed in the Patient Instructions below. Patient Instructions  Medication Instructions:  INCREASE amlodipine (Norvasc) to 5 mg daily-a new prescription has been sent to your pharmacy.  Testing/Procedures: Your physician has requested that you have an exercise stress myoview. For further information please visit  [email or web address]. Please follow instruction sheet, as given.  Follow-Up: Your physician recommends that you schedule a follow-up appointment in: 4 weeks with Dr. Claiborne Billings.   Any Other Special Instructions Will Be Listed Below (If Applicable).     If you need a refill on your cardiac medications before your next appointment, please call your pharmacy.      Signed, Shelva Majestic, MD  12/25/2016 [facility] [address], [place], [place], [place]  [address] Phone: [phone]

## 2016-12-26 ENCOUNTER — Telehealth (HOSPITAL_COMMUNITY): Payer: Self-pay

## 2016-12-26 NOTE — Telephone Encounter (Signed)
Encounter complete. 

## 2016-12-28 ENCOUNTER — Ambulatory Visit (HOSPITAL_COMMUNITY)
Admission: RE | Admit: 2016-12-28 | Discharge: 2016-12-28 | Disposition: A | Discharge status: Home / Self Care | Payer: Managed Care, Other (non HMO) | Source: Ambulatory Visit | Attending: Cardiology | Admitting: Cardiology

## 2016-12-28 DIAGNOSIS — R079 Chest pain, unspecified: Secondary | ICD-10-CM | POA: Diagnosis not present

## 2016-12-28 DIAGNOSIS — I2510 Atherosclerotic heart disease of native coronary artery without angina pectoris: Secondary | ICD-10-CM

## 2016-12-28 DIAGNOSIS — Z9861 Coronary angioplasty status: Secondary | ICD-10-CM

## 2016-12-28 LAB — MYOCARDIAL PERFUSION IMAGING
CHL CUP NUCLEAR SSS: 0
CHL RATE OF PERCEIVED EXERTION: 17
CSEPEW: 13.7 METS
Exercise duration (min): 12 min
Exercise duration (sec): 0 s
LV dias vol: 102 mL (ref 62–150)
LVSYSVOL: 46 mL
MPHR: 168 {beats}/min
NUC STRESS TID: 1.02
Peak HR: 155 {beats}/min
Percent HR: 92 %
Rest HR: 61 {beats}/min
SDS: 0
SRS: 0

## 2016-12-28 MED ORDER — TECHNETIUM TC 99M TETROFOSMIN IV KIT
31.8000 | PACK | Freq: Once | INTRAVENOUS | Status: AC | PRN
  Administered 2016-12-28: 31.8 via INTRAVENOUS
  Filled 2016-12-28: qty 32

## 2016-12-28 MED ORDER — REGADENOSON 0.4 MG/5ML IV SOLN: 0.4000 mg | Freq: Once | INTRAVENOUS | Status: DC

## 2016-12-28 MED ORDER — TECHNETIUM TC 99M TETROFOSMIN IV KIT
10.5000 | PACK | Freq: Once | INTRAVENOUS | Status: AC | PRN
  Administered 2016-12-28: 10.5 via INTRAVENOUS
  Filled 2016-12-28: qty 11

## 2017-01-18 ENCOUNTER — Telehealth: Payer: Self-pay | Admitting: Gastroenterology

## 2017-01-18 ENCOUNTER — Ambulatory Visit (INDEPENDENT_AMBULATORY_CARE_PROVIDER_SITE_OTHER): Payer: Managed Care, Other (non HMO) | Admitting: Cardiovascular Disease

## 2017-01-18 ENCOUNTER — Encounter: Payer: Self-pay | Admitting: Cardiovascular Disease

## 2017-01-18 VITALS — BP 140/78 | HR 67 | Ht 67.0 in | Wt 177.0 lb

## 2017-01-18 DIAGNOSIS — Z8669 Personal history of other diseases of the nervous system and sense organs: Secondary | ICD-10-CM | POA: Diagnosis not present

## 2017-01-18 DIAGNOSIS — G35 Multiple sclerosis: Secondary | ICD-10-CM

## 2017-01-18 DIAGNOSIS — I2510 Atherosclerotic heart disease of native coronary artery without angina pectoris: Secondary | ICD-10-CM | POA: Diagnosis not present

## 2017-01-18 DIAGNOSIS — E785 Hyperlipidemia, unspecified: Secondary | ICD-10-CM | POA: Diagnosis not present

## 2017-01-18 DIAGNOSIS — Z9861 Coronary angioplasty status: Secondary | ICD-10-CM | POA: Diagnosis not present

## 2017-01-18 DIAGNOSIS — I10 Essential (primary) hypertension: Secondary | ICD-10-CM

## 2017-01-18 DIAGNOSIS — K219 Gastro-esophageal reflux disease without esophagitis: Secondary | ICD-10-CM

## 2017-01-18 MED ORDER — MESALAMINE 1000 MG RE SUPP: 1000.0000 mg | Freq: Two times a day (BID) | RECTAL | 1 refills | Status: DC

## 2017-01-18 NOTE — Patient Instructions (Signed)
Medication Instructions:  Your physician recommends that you continue on your current medications as directed. Please refer to the Current Medication list given to you today.  Labwork: Please return for FASTING labs in 3 months (Lipid, Hepatic)  Our in office lab hours are Monday-Friday 8:00-4:30, closed for lunch 1-2 pm.  No appointment needed.  Follow-Up: Your physician wants you to follow-up in: 6 months with Dr. Claiborne Billings. You will receive a reminder letter in the mail two months in advance. If you don't receive a letter, please call our office to schedule the follow-up appointment.   Any Other Special Instructions Will Be Listed Below (If Applicable).     If you need a refill on your cardiac medications before your next appointment, please call your pharmacy.

## 2017-01-18 NOTE — Progress Notes (Signed)
Cardiology Office Note    Date:  01/19/2017   ID:  John Kelly, DOB 04/04/1965, MRN [id number]  PCP:  Jinny Sanders, MD  Cardiologist:  Shelva Majestic, MD   Chief Complaint  Patient presents with  . Follow-up    History of Present Illness:  John Kelly is a 51 y.o. male who presents for 1 month follow-up cardiology evaluation.  John Kelly has history of hypertension and multiple sclerosis.  On 09/01/2016 while working in the yard in significant heat he developed new onset chest pressure.  He presented to the ER was found to have 0.5 mm J-point elevation in leads V3 through V6.  He develop new transient right bundle branch block.  An idioventricular rhythm.  He continued to have chest pain which ultimately improved with IV nitroglycerin and heparin.  Troponin was mildly elevated.  He was taken to the catheterization laboratory and was found to preserved LV function with an EF of 60% with subtle area of mild focal mid anterolateral hypocontractility.  There was an ostial LAD stenosis of 60-70%, which improved slightly following IC nitroglycerin administration and he had mild 20% proximal LAD stenoses.  Had a normal codominant large left circumflex flex coronary artery.  His RCA at 30% smooth proximal stenosis.  Since he was pain-free and had normal flow the initial plan was to attempt medical therapy with discontinuance of his diuretic and initiation of beta blocker and amlodipine in addition to atorvastatin 80 mg.  He developed recurrent chest pain and on June 22 underwent stenting to his LAD ostium by Dr. Julianne Handler with insertion of a 4.08 mm Resolute onyx stent.  There was slight plaque shift into the ostium of the circumflex, but excellent flow down both vessels.  He was discharged following day.  He was was seen for office evaluation on 09/18/2016 by Almyra Deforest, PA.  Laboratory in September showed improvement in his lipids with total cholesterol 140, LDL 79, triglycerides 89, HDL 43.  He has  been on atorvastatin 80 mg and Zetia 10 mg was added to his regimen.    I had not seen him for initial office evaluation until one month ago when he had described  mild lightheadedness with bending over.  He also has experienced some nonexertional chest pain over the past several weeks, but he was uncertain if this was related to his multiple sclerosis.    Since his evaluation, he underwent a nuclear perfusion study which showed an EF of 55%.  Perfusion was normal without scar or skiing.  Anemia.  Laboratory revealed a total cluster 140, LDL remain greater than 70 with an LDL at 79.  LFTs were essentially normal, although ALT was 45 with upper normal being 44.  Over the past several weeks he has remained stable.  He denies any chest pain.  He is feeling better.  He denies palpitations.  In follow-up of his recent laboratory Zetia had been called in and he had just started taking 10 mg in addition to his atorvastatin.Marland Kitchen  He presents for evaluation  Past Medical History:  Diagnosis Date  . Abnormal liver function test   . Allergic rhinitis   . Allergy   . Anxiety   . CAD (coronary artery disease) 09/01/2016   a.  09/01/16; NSTEMI LHC: 60-70% ostial LAD stenosis, 20% mid LAD, 30% RCA. EF 55-65%  b. 09/07/16 [place] s/p DES to oLAD  . DDD (degenerative disc disease)   . Depression   . GERD (gastroesophageal reflux disease)   .  Hypertension    no meds  . Insomnia   . MS (multiple sclerosis) ([place])   . Ulcerative colitis    left sided    Past Surgical History:  Procedure Laterality Date  . CORONARY ANGIOGRAPHY N/A 09/07/2016   Procedure: Coronary Angiography;  Surgeon: Burnell Blanks, MD;  Location: [facility];  Service: Cardiovascular;  Laterality: N/A;  . CORONARY STENT INTERVENTION N/A 09/07/2016   Procedure: Coronary Stent Intervention;  Surgeon: Burnell Blanks, MD;  Location: [facility];  Service: Cardiovascular;  Laterality: N/A;  . Inguinal Herniorrhapy      Right and left  . LEFT HEART CATH AND CORONARY ANGIOGRAPHY N/A 09/01/2016   Procedure: Left Heart Cath and Coronary Angiography;  Surgeon: Troy Sine, MD;  Location: [facility];  Service: Cardiovascular;  Laterality: N/A;  . LEFT HEART CATH AND CORONARY ANGIOGRAPHY N/A 09/07/2016   Procedure: Left Heart Cath and Coronary Angiography;  Surgeon: Burnell Blanks, MD;  Location: [facility];  Service: Cardiovascular;  Laterality: N/A;  . PYLOROPLASTY      Current Medications: Outpatient Medications Prior to Visit  Medication Sig Dispense Refill  . acetaminophen (TYLENOL) 500 MG tablet Take 1,000 mg by mouth daily as needed for moderate pain or headache.    Marland Kitchen amLODipine (NORVASC) 5 MG tablet Take 1 tablet (5 mg total) by mouth daily. 90 tablet 3  . aspirin 81 MG EC tablet Take 1 tablet (81 mg total) by mouth daily. 30 tablet 11  . atorvastatin (LIPITOR) 80 MG tablet Take 1 tablet (80 mg total) by mouth daily at 6 PM. 30 tablet 5  . balsalazide (COLAZAL) 750 MG capsule Take 3 capsules (2,250 mg total) by mouth 3 (three) times daily. 810 capsule 3  . Cholecalciferol (VITAMIN D3) 5000 units CAPS Take 5,000 Units by mouth daily.    . clopidogrel (PLAVIX) 75 MG tablet Take 1 tablet (75 mg total) by mouth daily. 30 tablet 11  . diphenhydrAMINE (BENADRYL) 25 MG tablet Take 50 mg by mouth daily as needed for allergies.    . DiphenhydrAMINE HCl, Sleep, (ZZZQUIL PO) Take 1 Dose by mouth at bedtime as needed (sleep).    . ezetimibe (ZETIA) 10 MG tablet Take 1 tablet (10 mg total) by mouth daily. 30 tablet 5  . losartan (COZAAR) 50 MG tablet Take 1 tablet (50 mg total) by mouth daily. 30 tablet 5  . mesalamine (CANASA) 1000 MG suppository Place 1 suppository (1,000 mg total) rectally 2 (two) times daily. 60 suppository 1  . metoprolol tartrate (LOPRESSOR) 25 MG tablet Take 0.5 tablets (12.5 mg total) by mouth 2 (two) times daily. 30 tablet 5  . nitroGLYCERIN (NITROSTAT) 0.4 MG SL  tablet Place 1 tablet (0.4 mg total) under the tongue every 5 (five) minutes x 3 doses as needed for chest pain. 25 tablet 6  . OVER THE COUNTER MEDICATION Take 2 tablets by mouth once a week. Pre workout testosterone booster,    . ranitidine (ZANTAC) 75 MG tablet Take 75 mg by mouth daily as needed for heartburn.    . mesalamine (CANASA) 1000 MG suppository Place 1 suppository (1,000 mg total) rectally 2 (two) times daily. 60 suppository 1  . traZODone (DESYREL) 50 MG tablet TAKE ONE TABLET BY MOUTH AT BEDTIME AS NEEDED FOR SLEEP (Patient not taking: Reported on 01/18/2017) 90 tablet 1   No facility-administered medications prior to visit.      Allergies:   Diclofenac sodium; Skelaxin [  metaxalone]; and Trazodone and nefazodone   Social History   Social History  . Marital status: Divorced    Spouse name: N/A  . Number of children: 2  . Years of education: N/A   Occupational History  . 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 Smoking status: Never Smoker  . Smokeless tobacco: Never Used  . Alcohol use 0.0 oz/week     Comment: rarely  . Drug use: No  . Sexual activity: Not Currently   Other Topics Concern  . None   Social History Narrative   HSG   Divorced, not sexually active   1 son- '92, 1 daughter - '97   Work: Lobbyist- shipping/ rec'ing; quality management   Patient has never smoked   Alcohol use- rare 1-2 a month   Illicit Drug use- no    Patient gets regular exercise.     Family History:  The patient's family history includes Colitis in his mother; Coronary artery disease in his brother, father, and mother; Diabetes in his brother and father; Heart attack in his father and mother; Heart attack (age of onset: 53) in his brother; Hyperlipidemia in his father and mother; Hypertension in his brother, father, mother, and sister; Transient ischemic attack in his sister.   ROS General: Negative; No fevers, chills, or night sweats;    HEENT: Negative; No changes in vision or hearing, sinus congestion, difficulty swallowing Pulmonary: Negative; No cough, wheezing, shortness of breath, hemoptysis Cardiovascular: See history of present illness GI: Negative; No nausea, vomiting, diarrhea, or abdominal pain GU: Negative; No dysuria, hematuria, or difficulty voiding Musculoskeletal: Negative; no myalgias, joint pain, or weakness Hematologic/Oncology: Negative; no easy bruising, bleeding Endocrine: Negative; no heat/cold intolerance; no diabetes Neuro: Positive for multiple sclerosis Skin: Negative; No rashes or skin lesions Psychiatric: Negative; No behavioral problems, depression Sleep: Negative; No snoring, daytime sleepiness, hypersomnolence, bruxism, restless legs, hypnogognic hallucinations, no cataplexy Other comprehensive 14 point system review is negative.   PHYSICAL EXAM:   VS:  BP 140/78   Pulse 67   Ht 5' 7"  (1.702 m)   Wt 177 lb (80.3 kg)   BMI 27.72 kg/m     Repeat blood pressure by me was 138/76  Wt Readings from Last 3 Encounters:  01/18/17 177 lb (80.3 kg)  12/28/16 177 lb (80.3 kg)  12/19/16 177 lb 6.4 oz (80.5 kg)    General: Alert, oriented, no distress.  Skin: normal turgor, no rashes, warm and dry HEENT: Normocephalic, atraumatic. Pupils equal round and reactive to light; sclera anicteric; extraocular muscles intact; F Nose without nasal septal hypertrophy Mouth/Parynx benign; Mallinpatti scale 3 Neck: No JVD, no carotid bruits; normal carotid upstroke Lungs: clear to ausculatation and percussion; no wheezing or rales Chest wall: without tenderness to palpitation Heart: PMI not displaced, RRR, s1 s2 normal, 1/6 systolic murmur, no diastolic murmur, no rubs, gallops, thrills, or heaves Abdomen: soft, nontender; no hepatosplenomehaly, BS+; abdominal aorta nontender and not dilated by palpation. Back: no CVA tenderness Pulses 2+ Musculoskeletal: full range of motion, normal strength, no  joint deformities Extremities: no clubbing cyanosis or edema, Homan's sign negative  Neurologic: grossly nonfocal; Cranial nerves grossly wnl Psychologic: Normal mood and affect   Studies/Labs Reviewed:   ECG (independently read by me): Normal sinus rhythm at 67 bpm no ectopy  EKG:  EKG is ordered today. Normal sinus rhythm at 69 bpm.  Normal intervals.  No ectopy.  Nondagnostic T changes in lead 3.  Recent  Labs: BMP Latest Ref Rng & Units 09/08/2016 09/06/2016 09/02/2016  Glucose 65 - 99 mg/dL 94 88 81  BUN 6 - 20 mg/dL 9 9 8   Creatinine 0.61 - 1.24 mg/dL 0.98 0.93 0.94  BUN/Creat Ratio 9 - 20 - 10 -  Sodium 135 - 145 mmol/L 139 142 136  Potassium 3.5 - 5.1 mmol/L 3.9 5.1 3.6  Chloride 101 - 111 mmol/L 105 103 104  CO2 22 - 32 mmol/L 28 25 22   Calcium 8.9 - 10.3 mg/dL 8.4(L) 9.2 8.7(L)     Hepatic Function Latest Ref Rng & Units 11/23/2016 04/10/2016 12/04/2013  Total Protein 6.0 - 8.5 g/dL 6.4 6.7 6.6  Albumin 3.5 - 5.5 g/dL 4.4 4.4 3.9  AST 0 - 40 IU/L 30 23 26   ALT 0 - 44 IU/L 45(H) 25 27  Alk Phosphatase 39 - 117 IU/L 67 67 48  Total Bilirubin 0.0 - 1.2 mg/dL 1.1 1.1 0.6  Bilirubin, Direct 0.00 - 0.40 mg/dL 0.30 - -    CBC Latest Ref Rng & Units 09/08/2016 09/06/2016 09/03/2016  WBC 4.0 - 10.5 K/uL 6.6 6.5 8.1  Hemoglobin 13.0 - 17.0 g/dL 13.9 14.9 15.1  Hematocrit 39.0 - 52.0 % 41.4 44.0 45.5  Platelets 150 - 400 K/uL 160 198 190   Lab Results  Component Value Date   MCV 91.8 09/08/2016   MCV 92 09/06/2016   MCV 91.5 09/03/2016   Lab Results  Component Value Date   TSH 2.29 12/04/2013   No results found for: HGBA1C   BNP No results found for: BNP  ProBNP No results found for: PROBNP   Lipid Panel     Component Value Date/Time   CHOL 140 11/23/2016 1121   TRIG 89 11/23/2016 1121   HDL 43 11/23/2016 1121   CHOLHDL 3.3 11/23/2016 1121   CHOLHDL 4 04/10/2016 1037   VLDL 15.8 04/10/2016 1037   LDLCALC 79 11/23/2016 1121   LDLDIRECT 147.0 12/10/2011 1702      RADIOLOGY: No results found.   Additional studies/ records that were reviewed today include:  I reviewed the patient's June 2018 hospitalization, his 2 cardiac catheterization reports, discharge summary, and subsequent initial office visit with Almyra Deforest, PAC. I reviewed most recent laboratory from September 2018 as well as his nuclear perfusion study.  Study Highlights     The left ventricular ejection fraction is normal (55-65%).  Nuclear stress EF: 55%.  The study is normal.  This is a low risk study.  Blood pressure demonstrated a normal response to exercise.  There was no ST segment deviation noted during stress.   Low risk stress nuclear study with normal perfusion and normal left ventricular regional and global systolic function.      ASSESSMENT:    1. CAD S/P percutaneous coronary angioplasty   2. Essential hypertension, benign   3. Hyperlipidemia LDL goal <70   4. History of multiple sclerosis   5. Gastroesophageal reflux disease without esophagitis      PLAN:  John Kelly is a 51 year old gentleman who presented with unstable angina and may have had transient vasospasm of his ostial LAD contributing to J-point elevations initially in leads V3 2V's 5, which normalized following a short burst of idioventricular rhythm and with treatment of IV heparin and nitroglycerin.  Several days later due to recurrent chest pain symptomatology and he underwent stenting of his LAD ostium.   He was started on atorvastatin 80 mg and has been maintained on aspirin and Plavix without  bleeding.  He is a history of hypertension and has been on losartan 50 mg in addition to amlodipine 2.5 mg and metoprolol 12.5 mg twice a day.  When I last saw his blood pressure was elevated and further titrated amlodipine.  His blood pressure today is improved on amlodipine 5 mg, in addition to metoprolol 12.5 mg twice a day.  He'll monitor blood pressure.  Blood pressure continues to be  consistently greater than 642 systolic further dose adjustment will be necessary.  He had experienced several episodes of chest pain and he was uncertain if this was of similar characteristic as previously.  His nuclear perfusion study reveals normal perfusion without scar or ischemia.  He had normal ECG response.  Ejection fraction was 55%.  I reviewed the nuclear findings with him in detail.  Recent laboratory had also shown an LDL cholesterol of 79.  For this reason, Zetia was added to his atorvastatin in attempt to further reduce LDL below 70 and preferably into the 50s or below.  He has tolerated the addition.  In several months we will undergo repeat laboratory with a fasting lipid panel and LFTs.  He continues to take ranitidine for GERD, which is controlled.  I will see him in 6 months for follow-up evaluation.      Medication Adjustments/Labs and Tests Ordered: Current medicines are reviewed at length with the patient today.  Concerns regarding medicines are outlined above.  Medication changes, Labs and Tests ordered today are listed in the Patient Instructions below. Patient Instructions  Medication Instructions:  Your physician recommends that you continue on your current medications as directed. Please refer to the Current Medication list given to you today.  Labwork: Please return for FASTING labs in 3 months (Lipid, Hepatic)  Our in office lab hours are Monday-Friday 8:00-4:30, closed for lunch 1-2 pm.  No appointment needed.  Follow-Up: Your physician wants you to follow-up in: 6 months with Dr. Claiborne Billings. You will receive a reminder letter in the mail two months in advance. If you don't receive a letter, please call our office to schedule the follow-up appointment.   Any Other Special Instructions Will Be Listed Below (If Applicable).     If you need a refill on your cardiac medications before your next appointment, please call your pharmacy.      Signed, Shelva Majestic, MD    01/19/2017 2:17 PM    [facility] [address], [place], [place],   [address] Phone: [phone]

## 2017-01-18 NOTE — Telephone Encounter (Signed)
Prescription for Canasa suppositories sent to patient's pharmacy.

## 2017-01-28 ENCOUNTER — Encounter: Payer: Self-pay | Admitting: Gastroenterology

## 2017-01-28 ENCOUNTER — Ambulatory Visit (INDEPENDENT_AMBULATORY_CARE_PROVIDER_SITE_OTHER): Payer: Managed Care, Other (non HMO) | Admitting: Gastroenterology

## 2017-01-28 VITALS — BP 122/78 | HR 72 | Ht 67.0 in | Wt 179.4 lb

## 2017-01-28 DIAGNOSIS — K51311 Ulcerative (chronic) rectosigmoiditis with rectal bleeding: Secondary | ICD-10-CM

## 2017-01-28 DIAGNOSIS — K921 Melena: Secondary | ICD-10-CM

## 2017-01-28 MED ORDER — MESALAMINE 4 G RE ENEM: 4.0000 g | ENEMA | Freq: Every day | RECTAL | 0 refills | Status: DC

## 2017-01-28 MED ORDER — PREDNISONE 10 MG PO TABS: ORAL_TABLET | ORAL | 0 refills | Status: DC

## 2017-01-28 NOTE — Progress Notes (Signed)
    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  Started 1 month ago and symptoms have persisted.  He decided on his own to discontinue Plavix 1 week ago.  His bleeding has remained the same off Plavix.  Current Medications, Allergies, Past Medical History, Past Surgical History, Family History and Social History were reviewed in [employer] record.  Physical Exam: General: Well developed, well nourished, no acute distress Head: Normocephalic and atraumatic Eyes:  sclerae anicteric, EOMI Ears: Normal auditory acuity Mouth: No deformity or lesions Lungs: Clear throughout to auscultation Heart: Regular rate and rhythm; no murmurs, rubs or bruits Abdomen: Soft, non tender and non distended. No masses, hepatosplenomegaly or hernias noted. Normal Bowel sounds Rectal: no lesion, no tenderness, trace heme pos stool Musculoskeletal: Symmetrical with no gross deformities  Pulses:  Normal pulses noted Extremities: No clubbing, cyanosis, edema or deformities noted Neurological: Alert oriented x 4, grossly nonfocal Psychological:  Alert and cooperative. Normal mood and affect  Assessment and Recommendations:  1.  Ulcerative proctosigmoiditis, flare with rectal bleeding. Stressed need for compliance with follow up appts. Continue balsalazide 2.25 g tid.  Temporarily discontinue Canasa and begin Rowasa 4 g enema at bedtime for 2 weeks and then resume Canasa.  Begin prednisone taper as outlined.  REV in 1 month.  Patient is strongly advised to call if symptoms do not significantly improve within 7-10 days after starting prednisone.  2. CAD S/P LAD stent. Resume Plavix today and continue ASA 81 mg daily as recommended by Dr. Claiborne Billings.

## 2017-01-28 NOTE — Patient Instructions (Signed)
We have sent the following medications to your pharmacy for you to pick up at your convenience:Rowasa enemas at bedtime x 14 days, Prednisone taper: Take 40 mg (4 tablets) by mouth daily x 5 days, reduce to 30 mg (3 tablets) by mouth daily x 5 days, reduce to 20 mg (2 tablets) by mouth daily x 5 days, reduce to 10 mg ( 1 tablet) by mouth daily x 5 days, then reduce to 10 mg (1 tablet) by mouth every other day x 5 days and stop.  Stop using Canasa suppositories while using Rowasa enemas. Then resume after finished.   Resume Plavix and aspirin.   Normal BMI (Body Mass Index- based on height and weight) is between 19 and 25. Your BMI today is Body mass index is 28.09 kg/m. Marland Kitchen Please consider follow up  regarding your BMI with your Primary Care Provider.  Thank you for choosing me and [facility].  Pricilla Riffle. Dagoberto Ligas., MD., Marval Regal

## 2017-01-29 NOTE — Addendum Note (Signed)
Addended by: Therisa Doyne on: 01/29/2017 04:10 PM   Modules accepted: Orders

## 2017-02-28 ENCOUNTER — Ambulatory Visit: Payer: Managed Care, Other (non HMO) | Admitting: Gastroenterology

## 2017-03-26 DIAGNOSIS — R7989 Other specified abnormal findings of blood chemistry: Secondary | ICD-10-CM | POA: Diagnosis not present

## 2017-03-26 DIAGNOSIS — G35 Multiple sclerosis: Secondary | ICD-10-CM | POA: Diagnosis not present

## 2017-03-26 DIAGNOSIS — E538 Deficiency of other specified B group vitamins: Secondary | ICD-10-CM | POA: Diagnosis not present

## 2017-03-26 DIAGNOSIS — R5383 Other fatigue: Secondary | ICD-10-CM | POA: Diagnosis not present

## 2017-03-26 DIAGNOSIS — R5381 Other malaise: Secondary | ICD-10-CM | POA: Diagnosis not present

## 2017-04-01 ENCOUNTER — Ambulatory Visit: Payer: BLUE CROSS/BLUE SHIELD | Admitting: Gastroenterology

## 2017-04-01 ENCOUNTER — Encounter: Payer: Self-pay | Admitting: Gastroenterology

## 2017-04-01 ENCOUNTER — Other Ambulatory Visit (INDEPENDENT_AMBULATORY_CARE_PROVIDER_SITE_OTHER): Payer: BLUE CROSS/BLUE SHIELD

## 2017-04-01 VITALS — BP 130/84 | HR 76 | Ht 67.0 in | Wt 182.1 lb

## 2017-04-01 DIAGNOSIS — K51311 Ulcerative (chronic) rectosigmoiditis with rectal bleeding: Secondary | ICD-10-CM

## 2017-04-01 LAB — CBC WITH DIFFERENTIAL/PLATELET
Basophils Absolute: 0 10*3/uL (ref 0.0–0.1)
Basophils Relative: 0.5 % (ref 0.0–3.0)
EOS PCT: 4.2 % (ref 0.0–5.0)
Eosinophils Absolute: 0.3 10*3/uL (ref 0.0–0.7)
HEMATOCRIT: 42.6 % (ref 39.0–52.0)
HEMOGLOBIN: 14.4 g/dL (ref 13.0–17.0)
LYMPHS ABS: 2 10*3/uL (ref 0.7–4.0)
LYMPHS PCT: 31.2 % (ref 12.0–46.0)
MCHC: 33.9 g/dL (ref 30.0–36.0)
MCV: 92.6 fl (ref 78.0–100.0)
MONOS PCT: 8.3 % (ref 3.0–12.0)
Monocytes Absolute: 0.5 10*3/uL (ref 0.1–1.0)
Neutro Abs: 3.6 10*3/uL (ref 1.4–7.7)
Neutrophils Relative %: 55.8 % (ref 43.0–77.0)
Platelets: 205 10*3/uL (ref 150.0–400.0)
RBC: 4.6 Mil/uL (ref 4.22–5.81)
RDW: 12.8 % (ref 11.5–15.5)
WBC: 6.5 10*3/uL (ref 4.0–10.5)

## 2017-04-01 LAB — COMPREHENSIVE METABOLIC PANEL
ALBUMIN: 4.1 g/dL (ref 3.5–5.2)
ALK PHOS: 61 U/L (ref 39–117)
ALT: 24 U/L (ref 0–53)
AST: 21 U/L (ref 0–37)
BILIRUBIN TOTAL: 0.6 mg/dL (ref 0.2–1.2)
BUN: 9 mg/dL (ref 6–23)
CALCIUM: 8.6 mg/dL (ref 8.4–10.5)
CO2: 29 mEq/L (ref 19–32)
Chloride: 106 mEq/L (ref 96–112)
Creatinine, Ser: 1.24 mg/dL (ref 0.40–1.50)
GFR: 64.97 mL/min (ref >60.00)
Glucose, Bld: 94 mg/dL (ref 70–99)
POTASSIUM: 3.8 meq/L (ref 3.5–5.1)
Sodium: 140 mEq/L (ref 135–145)
TOTAL PROTEIN: 6.4 g/dL (ref 6.0–8.3)

## 2017-04-01 LAB — SEDIMENTATION RATE: Sed Rate: 5 mm/hr (ref 0–20)

## 2017-04-01 LAB — C-REACTIVE PROTEIN: CRP: 0.2 mg/dL — ABNORMAL LOW (ref 0.5–20.0)

## 2017-04-01 MED ORDER — BUDESONIDE 3 MG PO CPEP: 9.0000 mg | ORAL_CAPSULE | Freq: Every day | ORAL | 1 refills | Status: DC

## 2017-04-01 MED ORDER — MESALAMINE 4 G RE ENEM: 4.0000 g | ENEMA | Freq: Every day | RECTAL | 0 refills | Status: DC

## 2017-04-01 MED ORDER — DICYCLOMINE HCL 10 MG PO CAPS: 10.0000 mg | ORAL_CAPSULE | Freq: Three times a day (TID) | ORAL | 11 refills | Status: DC

## 2017-04-01 MED ORDER — MESALAMINE 1000 MG RE SUPP: 1000.0000 mg | Freq: Two times a day (BID) | RECTAL | 5 refills | Status: DC

## 2017-04-01 NOTE — Progress Notes (Signed)
    History of Present Illness: This is a 52 year old male with UC. Flare in 01/2017 with prednisone taper given.  He relates that he developed nausea and worsening diarrhea while on prednisone and he attributed to the prednisone and stopped prednisone after about 1 week.  He states he ran out of Canasa suppositories he could not get any refills.  He has about 3 formed to semi-formed bowel movements per day associated with abdominal cramping, mucus and bleeding.  He denies any recent antibiotic use.  He denies fevers and chills.  His  appetite is good and his weight is stable.  Current Medications, Allergies, Past Medical History, Past Surgical History, Family History and Social History were reviewed in [employer] record.  Physical Exam: General: Well developed, well nourished, no acute distress Head: Normocephalic and atraumatic Eyes:  sclerae anicteric, EOMI Ears: Normal auditory acuity Mouth: No deformity or lesions Lungs: Clear throughout to auscultation Heart: Regular rate and rhythm; no murmurs, rubs or bruits Abdomen: Soft, minimal RLQ tenderness and non distended. No masses, hepatosplenomegaly or hernias noted. Normal Bowel sounds Rectal: declined Musculoskeletal: Symmetrical with no gross deformities  Pulses:  Normal pulses noted Extremities: No clubbing, cyanosis, edema or deformities noted Neurological: Alert oriented x 4, grossly nonfocal Psychological:  Alert and cooperative. Normal mood and affect  Assessment and Recommendations:  1. Ulcerative proctosigmoiditis with persistent bleeding and abdominal cramping.  CBC, CMP, ESR, CRP today.  Begin budesonide 9 mg daily.  Resume Rowasa enemas qd for 2 weeks and then begin Canasa suppositories twice daily for 2 months.  Dicyclomine 10 mg 3 times daily for abdominal cramping.  Colonoscopy if symptoms not improved. Call before REV if symptoms are not improving. REV in 1 month.

## 2017-04-01 NOTE — Patient Instructions (Addendum)
Your physician has requested that you go to the basement for lab work before leaving today.  We have sent the following medications to your pharmacy for you to pick up at your convenience: Entocort 9 mg daily, Bentyl 10 mg three times a day, Rowasa enemas at bedtime x 2 weeks, and then use Canasa suppositories twice daily.   Thank you for choosing me and [facility].  Pricilla Riffle. Dagoberto Ligas., MD., Marval Regal

## 2017-05-07 ENCOUNTER — Ambulatory Visit: Payer: BLUE CROSS/BLUE SHIELD | Admitting: Gastroenterology

## 2017-05-07 ENCOUNTER — Encounter: Payer: Self-pay | Admitting: Gastroenterology

## 2017-05-07 VITALS — BP 120/80 | HR 80 | Ht 67.0 in | Wt 180.0 lb

## 2017-05-07 DIAGNOSIS — K5130 Ulcerative (chronic) rectosigmoiditis without complications: Secondary | ICD-10-CM

## 2017-05-07 MED ORDER — BALSALAZIDE DISODIUM 750 MG PO CAPS: 2250.0000 mg | ORAL_CAPSULE | Freq: Three times a day (TID) | ORAL | 5 refills | Status: DC

## 2017-05-07 NOTE — Progress Notes (Signed)
    History of Present Illness: This is a 52 year old male returning for follow-up of ulcerative proctosigmoiditis.  He completed a prednisone taper and then began a course of budesonide.  He completed Rowasa enemas qd for 2 weeks. He was advised to continue Canasa suppositories qd however he is discontinued.  He states he has not been taking balsalazide for several weeks due to some difficulty filling his prescription.  He is having 1 formed bowel movement per day without bleeding or abdominal pain.  He feels he has returned to his baseline.  Current Medications, Allergies, Past Medical History, Past Surgical History, Family History and Social History were reviewed in [employer] record.  Physical Exam: General: Well developed, well nourished, no acute distress Head: Normocephalic and atraumatic Eyes:  sclerae anicteric, EOMI Ears: Normal auditory acuity Psychological:  Alert and cooperative. Normal mood and affect  Assessment and Recommendations:  1. Ulcerative proctosigmoiditis flare resolved.  Noncompliance with recommended medications.  Okay to remain off [place].  Resume balsalazide 2.25 g 3 times daily for long-term maintenance.  Complete the 66-monthcourse of budesonide as prescribed.   Compliance with recommended medications was stressed.  If balsalazide is not adequately covered by his insurance we can use another mesalamine. REV in 6 months.   I spent 15 minutes of face-to-face time with the patient. Greater than 50% of the time was spent counseling and coordinating care.

## 2017-05-07 NOTE — Patient Instructions (Signed)
We have sent the following medications to your pharmacy for you to pick up at your convenience: balsalazide.   Thank you for choosing me and [facility].  Pricilla Riffle. Dagoberto Ligas., MD., Marval Regal

## 2017-07-02 ENCOUNTER — Telehealth: Payer: Self-pay | Admitting: Gastroenterology

## 2017-07-02 NOTE — Telephone Encounter (Signed)
Informed patient that we no longer carry the Canasa savings cards but I pulled one of the [employer] that he can possibly use. Gave patient that information and patient verbalized understanding.

## 2017-08-10 ENCOUNTER — Ambulatory Visit (INDEPENDENT_AMBULATORY_CARE_PROVIDER_SITE_OTHER): Payer: BLUE CROSS/BLUE SHIELD | Admitting: Family Medicine

## 2017-08-10 ENCOUNTER — Encounter: Payer: Self-pay | Admitting: Family Medicine

## 2017-08-10 VITALS — BP 160/90 | HR 74 | Temp 98.2°F | Resp 14 | Ht 67.0 in | Wt 173.0 lb

## 2017-08-10 DIAGNOSIS — I10 Essential (primary) hypertension: Secondary | ICD-10-CM | POA: Diagnosis not present

## 2017-08-10 DIAGNOSIS — Z114 Encounter for screening for human immunodeficiency virus [HIV]: Secondary | ICD-10-CM | POA: Diagnosis not present

## 2017-08-10 DIAGNOSIS — Z113 Encounter for screening for infections with a predominantly sexual mode of transmission: Secondary | ICD-10-CM

## 2017-08-10 DIAGNOSIS — R369 Urethral discharge, unspecified: Secondary | ICD-10-CM

## 2017-08-10 MED ORDER — CEFTRIAXONE SODIUM 500 MG IJ SOLR
250.0000 mg | Freq: Once | INTRAMUSCULAR | Status: AC
  Administered 2017-08-10: 250 mg via INTRAMUSCULAR

## 2017-08-10 MED ORDER — AZITHROMYCIN 250 MG PO TABS
ORAL_TABLET | ORAL | 0 refills | Status: DC
Start: 2017-08-10 — End: 2018-01-06

## 2017-08-10 NOTE — Patient Instructions (Addendum)
Come back on Monday after next for both urine and blood tests at the [facility] lab. Make sure to not pee for an hour prior to your test- make sure to not clean the penis and pee right into a cup  Treating you today with ceftriaxone 287m intramuscular- go immediately and take the 4 pills of azithromycin today. This will treat you for both gonorrhea and chlamydia  Take those blood pressure pills as soon as you can- prior blood pressures have been controlled

## 2017-08-10 NOTE — Progress Notes (Signed)
Pre visit review using our clinic review tool, if applicable. No additional management support is needed unless otherwise documented below in the visit note. 

## 2017-08-10 NOTE — Assessment & Plan Note (Signed)
S: controlled on no rx- forgot to take his medicines before he came to clinic this morning BP Readings from Last 3 Encounters:  08/10/17 (!) 160/90  05/07/17 120/80  04/01/17 130/84  A/P: We discussed blood pressure goal of <140/90. He agrees to take his amlodipine and losartan as soon as he gets home.

## 2017-08-10 NOTE — Progress Notes (Signed)
Subjective:  John Kelly is a 52 y.o. year old very pleasant male patient who presents for/with See problem oriented charting ROS- no fever, chills, nausea, vomiting, dysuria. Does have penile discharge   Past Medical History-  Patient Active Problem List   Diagnosis Date Noted  . Unstable angina ([place]) 09/07/2016  . Dyslipidemia 09/06/2016  . Non-ST elevation (NSTEMI) myocardial infarction ([place]) 09/01/2016  . CAD (coronary artery disease) 09/01/2016  . Umbilical hernia 01/77/9390  . Multiple sclerosis ([place]) 12/15/2014  . DDD (degenerative disc disease), lumbar 09/22/2013  . Family history of early CAD 09/22/2013  . Depression with anxiety 01/18/2011  . 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 Mild intermittent asthma 01/07/2009  . Essential hypertension, benign 04/14/2007  . ALLERGIC RHINITIS 04/14/2007  . GERD 04/14/2007    Medications- reviewed and updated Current Outpatient Medications  Medication Sig Dispense Refill  . amLODipine (NORVASC) 5 MG tablet Take 1 tablet (5 mg total) by mouth daily. 90 tablet 3  . aspirin 81 MG EC tablet Take 1 tablet (81 mg total) by mouth daily. 30 tablet 11  . atorvastatin (LIPITOR) 80 MG tablet Take 1 tablet (80 mg total) by mouth daily at 6 PM. 30 tablet 5  . balsalazide (COLAZAL) 750 MG capsule Take 3 capsules (2,250 mg total) by mouth 3 (three) times daily. 270 capsule 5  . budesonide (ENTOCORT EC) 3 MG 24 hr capsule Take 3 capsules (9 mg total) by mouth daily. 90 capsule 1  . cefTRIAXone (ROCEPHIN) 500 MG injection Inject 500 mg into the muscle once. For IM use in large muscle mass    . Cholecalciferol (VITAMIN D3) 5000 units CAPS Take 5,000 Units by mouth daily.    . clopidogrel (PLAVIX) 75 MG tablet Take 1 tablet (75 mg total) by mouth daily. 30 tablet 11  . dicyclomine (BENTYL) 10 MG capsule Take 1 capsule (10 mg total) by mouth 3 (three) times daily before meals. 90 capsule 11  . diphenhydrAMINE (BENADRYL) 25 MG  tablet Take 50 mg by mouth daily as needed for allergies.    . DiphenhydrAMINE HCl, Sleep, (ZZZQUIL PO) Take 1 Dose by mouth at bedtime as needed (sleep).    . losartan (COZAAR) 50 MG tablet Take 1 tablet (50 mg total) by mouth daily. 30 tablet 5  . mesalamine (CANASA) 1000 MG suppository Place 1 suppository (1,000 mg total) rectally 2 (two) times daily. 60 suppository 5  . metoprolol tartrate (LOPRESSOR) 25 MG tablet Take 0.5 tablets (12.5 mg total) by mouth 2 (two) times daily. 30 tablet 5  . nitroGLYCERIN (NITROSTAT) 0.4 MG SL tablet Place 1 tablet (0.4 mg total) under the tongue every 5 (five) minutes x 3 doses as needed for chest pain. 25 tablet 6  . OVER THE COUNTER MEDICATION Take 2 tablets by mouth once a week. Pre workout testosterone booster,    . predniSONE (DELTASONE) 10 MG tablet Take 40 mg (4 tablets) by mouth daily x 5 days, reduce to 30 mg (3 tablets) by mouth daily x 5 days, reduce to 20 mg (2 tablets) by mouth daily x 5 days, reduce to 10 mg ( 1 tablet) by mouth daily x 5 days, then reduce to 10 mg (1 tablet) by mouth every other day x 5 days and stop. 55 tablet 0  . ranitidine (ZANTAC) 75 MG tablet Take 75 mg by mouth daily as needed for heartburn.    Marland Kitchen acetaminophen (TYLENOL) 500 MG tablet Take 1,000 mg by mouth daily as needed for  moderate pain or headache.    Marland Kitchen azithromycin (ZITHROMAX) 250 MG tablet Take 4 tablets all at once 4 tablet 0  . ezetimibe (ZETIA) 10 MG tablet Take 1 tablet (10 mg total) by mouth daily. 30 tablet 5  . mesalamine (ROWASA) 4 g enema Place 60 mLs (4 g total) rectally at bedtime for 14 days. 14 Bottle 0   No current facility-administered medications for this visit.     Objective: BP (!) 160/90 (BP Location: Left Arm, Patient Position: Sitting, Cuff Size: Normal)   Pulse 74   Temp 98.2 F (36.8 C) (Oral)   Resp 14   Ht 5' 7"  (1.702 m)   Wt 173 lb (78.5 kg)   SpO2 98%   BMI 27.10 kg/m  Gen: NAD, resting comfortably CV: RRR no murmurs rubs or  gallops Lungs: CTAB no crackles, wheeze, rhonchi Abdomen: soft/nontender/nondistended/normal bowel sounds.   Ext: no edema Skin: warm, dry GU: no genital lesions. No penile discharge noted at this moment.   Assessment/Plan:  Penile discharge - Plan: Urine cytology ancillary only, cefTRIAXone (ROCEPHIN) injection 250 mg S:  having drainage from penis for 3 days- clear. No burning with peeing. Unprotected sex last Saturday. No partners since Saturday- first time in 6 months. He states he does not know the person.  A/P: Ideally would get gonorrhea and chlamydia testing before treatment but unfortunately he would have to go across the street to hospital for that. He has not seen PCP in sometime and I'm concern about loss to follow up (did encourage PCP follow up). For that reason- I went ahead and treated him for both potential gonorrhea and chlamydia with CTX 234m IM and azithromycin 1g (did not have access to this in clinic to do directly observed treatment). He agrees to return in 10 days for urine cytology as well as bloodwork for HIV and RPR screening.   Essential hypertension, benign S: controlled on no rx- forgot to take his medicines before he came to clinic this morning BP Readings from Last 3 Encounters:  08/10/17 (!) 160/90  05/07/17 120/80  04/01/17 130/84  A/P: We discussed blood pressure goal of <140/90. He agrees to take his amlodipine and losartan as soon as he gets home.   Lab/Order associations: Penile discharge - Plan: Urine cytology ancillary only, cefTRIAXone (ROCEPHIN) injection 250 mg  Essential hypertension, benign  Screening for HIV (human immunodeficiency virus) - Plan: HIV antibody, cefTRIAXone (ROCEPHIN) injection 250 mg  Screening examination for venereal disease - Plan: RPR, cefTRIAXone (ROCEPHIN) injection 250 mg  Meds ordered this encounter  Medications  . azithromycin (ZITHROMAX) 250 MG tablet    Sig: Take 4 tablets all at once    Dispense:  4 tablet     Refill:  0  . cefTRIAXone (ROCEPHIN) injection 250 mg   Return precautions advised.  SGarret Reddish MD

## 2017-08-30 ENCOUNTER — Other Ambulatory Visit: Payer: Self-pay | Admitting: Gastroenterology

## 2017-09-02 ENCOUNTER — Telehealth: Payer: Self-pay | Admitting: Gastroenterology

## 2017-09-02 MED ORDER — BUDESONIDE 3 MG PO CPEP: 9.0000 mg | ORAL_CAPSULE | Freq: Every day | ORAL | 1 refills | Status: DC

## 2017-09-02 NOTE — Telephone Encounter (Signed)
Patient requesting medication budesonide be refilled at [facility] in [place]. Patient last seen 2.19.19

## 2017-09-02 NOTE — Telephone Encounter (Signed)
I have spoken to patient to advise of Dr Lynne Leader recommendations. He will restart budesonide 9 mg x 2 months in place of balsalazide and is advised to let us know if his symptoms do not improve. Patient is also advised that he is due for follow up office visit in late August/early September. Unfortunately, we have no availability on schedule right now but should be getting a schedule by the end of the week which I have asked him to call and get scheduled for an office visit in. He states that he will do this.

## 2017-09-02 NOTE — Addendum Note (Signed)
Addended by: Larina Bras on: 09/02/2017 05:13 PM   Modules accepted: Orders

## 2017-09-02 NOTE — Telephone Encounter (Signed)
OK to refill budesonide 9 mg daily for a 2 month supply. Please confirm when he is due for his next office appt and remind him.

## 2017-09-02 NOTE — Telephone Encounter (Signed)
Patient called requesting refills on budesonide. Per last office note 04/2017, patient should only be on budesonide x 2 months but should then return to balsalazide 2.25 g TID dosing thereafter. I advised patient that he should no longer be on budesonide but rather on balsalazide.   Patient indicates that he has been having "a colitis flare up" and was hoping to get budesonide refills before going out of the country in 2 weeks. Upon further questioning, patient states that about 1 week ago, he had a diarrheal illness after having some "bad food" and since then has had occasional rectal bleeding, generalized intermittent abdominal pain, and nausea without vomiting. He states the diarrhea has now resolved. He tells me he is taking balsalazide 2.25 g TID, rowasa 1 enema daily and will be starting canasa suppositories tonight.   Of note: He will be going to [place] and [place] on a cruise in the upcoming weeks.  Please advise.Marland KitchenMarland KitchenMarland Kitchen

## 2017-09-11 ENCOUNTER — Other Ambulatory Visit: Payer: Self-pay | Admitting: Cardiology

## 2017-10-22 ENCOUNTER — Encounter: Payer: Self-pay | Admitting: Gastroenterology

## 2017-10-25 ENCOUNTER — Encounter (INDEPENDENT_AMBULATORY_CARE_PROVIDER_SITE_OTHER): Payer: BLUE CROSS/BLUE SHIELD | Admitting: Ophthalmology

## 2017-10-25 DIAGNOSIS — H31002 Unspecified chorioretinal scars, left eye: Secondary | ICD-10-CM | POA: Diagnosis not present

## 2017-10-25 DIAGNOSIS — H43813 Vitreous degeneration, bilateral: Secondary | ICD-10-CM

## 2017-10-25 DIAGNOSIS — H5340 Unspecified visual field defects: Secondary | ICD-10-CM

## 2017-11-27 ENCOUNTER — Telehealth: Payer: Self-pay | Admitting: Gastroenterology

## 2017-11-27 MED ORDER — BALSALAZIDE DISODIUM 750 MG PO CAPS: 2250.0000 mg | ORAL_CAPSULE | Freq: Three times a day (TID) | ORAL | 1 refills | Status: DC

## 2017-11-27 NOTE — Telephone Encounter (Signed)
Patient is currently taking Colazal. Prescription for Colazal sent to patient's pharmacy and patient notified to keep appt for further refills.

## 2018-01-06 ENCOUNTER — Encounter: Payer: Self-pay | Admitting: Gastroenterology

## 2018-01-06 ENCOUNTER — Ambulatory Visit: Payer: BLUE CROSS/BLUE SHIELD | Admitting: Gastroenterology

## 2018-01-06 ENCOUNTER — Other Ambulatory Visit: Payer: Self-pay | Admitting: Family Medicine

## 2018-01-06 VITALS — BP 142/84 | HR 74 | Ht 67.0 in | Wt 178.0 lb

## 2018-01-06 DIAGNOSIS — K5130 Ulcerative (chronic) rectosigmoiditis without complications: Secondary | ICD-10-CM

## 2018-01-06 DIAGNOSIS — K219 Gastro-esophageal reflux disease without esophagitis: Secondary | ICD-10-CM | POA: Diagnosis not present

## 2018-01-06 MED ORDER — BALSALAZIDE DISODIUM 750 MG PO CAPS: 2250.0000 mg | ORAL_CAPSULE | Freq: Three times a day (TID) | ORAL | 6 refills | Status: DC

## 2018-01-06 MED ORDER — FAMOTIDINE 20 MG PO TABS: 20.0000 mg | ORAL_TABLET | Freq: Every day | ORAL | 6 refills | Status: DC

## 2018-01-06 NOTE — Progress Notes (Signed)
    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  He had a flare in June and was placed on a 12-monthcourse of budesonide.  He is now having 1-2 bowel movements per day without bleeding or mucus.  He relates frequent nausea and heartburn often exacerbated by meals.  Symptoms have worsened since he stopped taking Zantac.  He relates that Canasa suppositories are no longer covered by his insurance.  Current Medications, Allergies, Past Medical History, Past Surgical History, Family History and Social History were reviewed in [employer].  Physical Exam: General: Well developed, well nourished, no acute distress Head: Normocephalic and atraumatic Eyes:  sclerae anicteric, EOMI Ears: Normal auditory acuity Mouth: No deformity or lesions Lungs: Clear throughout to auscultation Heart: Regular rate and rhythm; no murmurs, rubs or bruits Abdomen: Soft, non tender and non distended. No masses, hepatosplenomegaly or hernias noted. Normal Bowel sounds Rectal: Not done Musculoskeletal: Symmetrical with no gross deformities  Pulses:  Normal pulses noted Extremities: No clubbing, cyanosis, edema or deformities noted Neurological: Alert oriented x 4, grossly nonfocal Psychological:  Alert and cooperative. Normal mood and affect   Assessment and Recommendations:  1. Ulcerative proctosigmoiditis.  Renew balsalazide 2.25 g 3 times daily.  Preparation H suppositories coated with 1% hydrocortisone cream daily as needed for symptom flares to be used in place of Canasa suppositories.  2.  GERD, dyspepsia, nausea.  Zantac discontinued.  Begin famotidine 20 mg daily.  Follow standard antireflux measures.  Patient is advised to call if his symptoms are not under good control within 2 weeks.  Consider increasing famotidine to twice daily or beginning a PPI if symptoms not well controlled.

## 2018-01-06 NOTE — Patient Instructions (Signed)
We have sent the following medications to your pharmacy for you to pick up at your convenience: Balsalazide and famotidine.   You can use over the counter preparation H suppositories coated with 1% hydrocortisone cream daily as needed.  Thank you for choosing me and [facility].  Pricilla Riffle. Dagoberto Ligas., MD., Marval Regal

## 2018-01-26 ENCOUNTER — Other Ambulatory Visit: Payer: Self-pay | Admitting: Gastroenterology

## 2018-03-26 DIAGNOSIS — E559 Vitamin D deficiency, unspecified: Secondary | ICD-10-CM | POA: Diagnosis not present

## 2018-03-26 DIAGNOSIS — G35 Multiple sclerosis: Secondary | ICD-10-CM | POA: Diagnosis not present

## 2018-03-26 DIAGNOSIS — F5104 Psychophysiologic insomnia: Secondary | ICD-10-CM | POA: Diagnosis not present

## 2018-04-15 ENCOUNTER — Other Ambulatory Visit (HOSPITAL_COMMUNITY): Payer: Self-pay | Admitting: Neurology

## 2018-04-15 ENCOUNTER — Other Ambulatory Visit: Payer: Self-pay | Admitting: Neurology

## 2018-04-15 DIAGNOSIS — G35 Multiple sclerosis: Secondary | ICD-10-CM

## 2018-04-24 ENCOUNTER — Ambulatory Visit: Payer: BLUE CROSS/BLUE SHIELD

## 2018-04-24 ENCOUNTER — Other Ambulatory Visit: Payer: BLUE CROSS/BLUE SHIELD

## 2018-04-24 DIAGNOSIS — R29898 Other symptoms and signs involving the musculoskeletal system: Secondary | ICD-10-CM | POA: Diagnosis not present

## 2018-04-24 DIAGNOSIS — Z114 Encounter for screening for human immunodeficiency virus [HIV]: Secondary | ICD-10-CM

## 2018-04-24 DIAGNOSIS — R202 Paresthesia of skin: Secondary | ICD-10-CM | POA: Diagnosis not present

## 2018-04-24 DIAGNOSIS — Z113 Encounter for screening for infections with a predominantly sexual mode of transmission: Secondary | ICD-10-CM

## 2018-04-24 DIAGNOSIS — G35 Multiple sclerosis: Secondary | ICD-10-CM | POA: Diagnosis not present

## 2018-04-24 DIAGNOSIS — R5383 Other fatigue: Secondary | ICD-10-CM | POA: Diagnosis not present

## 2018-04-24 DIAGNOSIS — M6289 Other specified disorders of muscle: Secondary | ICD-10-CM | POA: Diagnosis not present

## 2018-04-25 LAB — HIV ANTIBODY (ROUTINE TESTING W REFLEX): HIV 1&2 Ab, 4th Generation: NONREACTIVE

## 2018-04-25 LAB — RPR: RPR Ser Ql: NONREACTIVE

## 2018-05-01 ENCOUNTER — Other Ambulatory Visit: Payer: BLUE CROSS/BLUE SHIELD

## 2018-05-01 ENCOUNTER — Other Ambulatory Visit (HOSPITAL_COMMUNITY)
Admission: RE | Admit: 2018-05-01 | Discharge: 2018-05-01 | Disposition: A | Discharge status: Home / Self Care | Payer: BLUE CROSS/BLUE SHIELD | Source: Ambulatory Visit | Attending: Family Medicine | Admitting: Family Medicine

## 2018-05-01 DIAGNOSIS — R369 Urethral discharge, unspecified: Secondary | ICD-10-CM | POA: Diagnosis not present

## 2018-05-05 LAB — URINE CYTOLOGY ANCILLARY ONLY
CHLAMYDIA, DNA PROBE: NEGATIVE
Neisseria Gonorrhea: NEGATIVE
Trichomonas: NEGATIVE

## 2018-06-03 ENCOUNTER — Other Ambulatory Visit: Payer: Self-pay | Admitting: Gastroenterology

## 2018-07-31 ENCOUNTER — Other Ambulatory Visit: Payer: Self-pay | Admitting: Gastroenterology

## 2018-09-08 ENCOUNTER — Other Ambulatory Visit: Payer: Self-pay | Admitting: Gastroenterology

## 2018-09-24 DIAGNOSIS — M4802 Spinal stenosis, cervical region: Secondary | ICD-10-CM | POA: Diagnosis not present

## 2018-09-24 DIAGNOSIS — F5104 Psychophysiologic insomnia: Secondary | ICD-10-CM | POA: Diagnosis not present

## 2018-09-24 DIAGNOSIS — G35 Multiple sclerosis: Secondary | ICD-10-CM | POA: Diagnosis not present

## 2018-09-27 ENCOUNTER — Encounter: Payer: Self-pay | Admitting: Gastroenterology

## 2018-10-14 DIAGNOSIS — M4802 Spinal stenosis, cervical region: Secondary | ICD-10-CM | POA: Diagnosis not present

## 2019-03-11 ENCOUNTER — Other Ambulatory Visit: Payer: Self-pay | Admitting: Gastroenterology

## 2019-03-24 ENCOUNTER — Encounter: Payer: Self-pay | Admitting: Family Medicine

## 2019-03-24 ENCOUNTER — Ambulatory Visit (INDEPENDENT_AMBULATORY_CARE_PROVIDER_SITE_OTHER): Payer: BC Managed Care – PPO | Admitting: Family Medicine

## 2019-03-24 VITALS — Temp 98.6°F | Ht 67.0 in

## 2019-03-24 DIAGNOSIS — R0981 Nasal congestion: Secondary | ICD-10-CM | POA: Diagnosis not present

## 2019-03-24 DIAGNOSIS — H9201 Otalgia, right ear: Secondary | ICD-10-CM | POA: Diagnosis not present

## 2019-03-24 DIAGNOSIS — F5104 Psychophysiologic insomnia: Secondary | ICD-10-CM

## 2019-03-24 MED ORDER — TRAZODONE HCL 50 MG PO TABS: 25.0000 mg | ORAL_TABLET | Freq: Every evening | ORAL | 3 refills | Status: DC | PRN

## 2019-03-24 NOTE — Progress Notes (Signed)
Work note written as instructed by [name].

## 2019-03-24 NOTE — Assessment & Plan Note (Signed)
Most liely viral URI.  Recommend COVID19 testing.. pt agreeable.. info provide via [place].  No red flags, minimal symptoms.Marland Kitchen symptomatic care recommended.  Isolation until testing returns.. reviewed with pt.   Given ear pain.Marland Kitchen less likely bacterial infection.  Treat with mucinex DM, flonsae.. if not improving  In 48-72 hours consider in person OV at respiratory clinic for ear exam.

## 2019-03-24 NOTE — Progress Notes (Signed)
VIRTUAL VISIT Due to national recommendations of social distancing due to [place] 19, a virtual visit is felt to be most appropriate for this patient at this time.   I connected with the patient on 03/24/19 at  2:20 PM EST by virtual telehealth platform and verified that I am speaking with the correct person using two identifiers.   I discussed the limitations, risks, security and privacy concerns of performing an evaluation and management service by  virtual telehealth platform and the availability of in person appointments. I also discussed with the patient that there may be a patient responsible charge related to this service. The patient expressed understanding and agreed to proceed.  Patient location: Home Provider Location: [facility] Participants: Eliezer Lofts and [name]   Chief Complaint  Patient presents with  . Sore Throat  . Otalgia    Right   . Nasal Congestion  . Adenopathy  . Insomnia    History of Present Illness:   54 year old male with multiple sclerosis UC on colazol  presents with new onset ST, right ear pain, congestion and swollen lymph nodes.  Right ear pain.. moderate.. no deacrease hearing, no fluid in ear.  He has noted these symptoms beginning in last 24 hours. No fever. No SOB, mild cough, dry. No facial pain.  Insomnia, acute.. hx off and on for years a while worse in last 2 weeks but in last 2 days he has not slept at all No anxiety, no depression. No new meds... using Z quil and benadryl.. off and on in past... did npot help in past 2 nights.  Melatonin does not help.  COVID 19 screen No recent travel or known exposure to [place] The importance of social distancing was discussed today.   Review of Systems  Constitutional: Negative for chills and fever.  HENT: Positive for congestion, ear pain and sore throat. Negative for sinus pain.   Eyes: Negative for pain and redness.  Respiratory: Positive for cough. Negative for sputum  production and shortness of breath.   Cardiovascular: Negative for chest pain, palpitations and leg swelling.  Gastrointestinal: Negative for abdominal pain, blood in stool, constipation, diarrhea, nausea and vomiting.  Genitourinary: Negative for dysuria.  Musculoskeletal: Negative for falls and myalgias.  Skin: Negative for rash.  Neurological: Negative for dizziness.  Psychiatric/Behavioral: Negative for depression. The patient is not nervous/anxious.       Past Medical History:  Diagnosis Date  . Abnormal liver function test   . Allergic rhinitis   . Allergy   . Anxiety   . CAD (coronary artery disease) 09/01/2016   a.  09/01/16; NSTEMI LHC: 60-70% ostial LAD stenosis, 20% mid LAD, 30% RCA. EF 55-65%  b. 09/07/16 [place] s/p DES to oLAD  . DDD (degenerative disc disease)   . Depression   . GERD (gastroesophageal reflux disease)   . Hypertension    no meds  . Insomnia   . MS (multiple sclerosis) ([place])   . Ulcerative colitis    left sided    reports that he has never smoked. He has never used smokeless tobacco. He reports current alcohol use. He reports that he does not use drugs.   Current Outpatient Medications:  .  acetaminophen (TYLENOL) 500 MG tablet, Take 1,000 mg by mouth daily as needed for moderate pain or headache., Disp: , Rfl:  .  Ascorbic Acid (VITAMIN C) 1000 MG tablet, Take 1,000 mg by mouth daily., Disp: , Rfl:  .  aspirin 81 MG  EC tablet, Take 1 tablet (81 mg total) by mouth daily., Disp: 30 tablet, Rfl: 11 .  balsalazide (COLAZAL) 750 MG capsule, Take 3 capsules (2,250 mg total) by mouth 3 (three) times daily., Disp: 270 capsule, Rfl: 6 .  Cholecalciferol (VITAMIN D3) 5000 units CAPS, Take 5,000 Units by mouth daily., Disp: , Rfl:  .  cyclobenzaprine (FLEXERIL) 5 MG tablet, Take by mouth., Disp: , Rfl:  .  diphenhydrAMINE (BENADRYL) 25 MG tablet, Take 50 mg by mouth daily as needed for allergies., Disp: , Rfl:  .  DiphenhydrAMINE HCl, Sleep, (ZZZQUIL PO), Take 1  Dose by mouth at bedtime as needed (sleep)., Disp: , Rfl:  .  Misc Natural Products (OSTEO BI-FLEX TRIPLE STRENGTH PO), Take 2 tablets by mouth daily., Disp: , Rfl:  .  nitroGLYCERIN (NITROSTAT) 0.4 MG SL tablet, Place 1 tablet (0.4 mg total) under the tongue every 5 (five) minutes x 3 doses as needed for chest pain., Disp: 25 tablet, Rfl: 6 .  zinc gluconate 50 MG tablet, Take 50 mg by mouth daily., Disp: , Rfl:  .  losartan (COZAAR) 50 MG tablet, Take 1 tablet (50 mg total) by mouth daily. (Patient not taking: Reported on 03/24/2019), Disp: 30 tablet, Rfl: 5 .  metoprolol tartrate (LOPRESSOR) 25 MG tablet, Take 0.5 tablets (12.5 mg total) by mouth 2 (two) times daily. (Patient not taking: Reported on 03/24/2019), Disp: 30 tablet, Rfl: 5   Observations/Objective: Temperature 98.6 F (37 C), temperature source Oral, height 5' 7"  (1.702 m).  Physical Exam  Physical Exam Constitutional:      General: The patient is not in acute distress. Pulmonary:     Effort: Pulmonary effort is normal. No respiratory distress.  Neurological:     Mental Status: The patient is alert and oriented to person, place, and time.  Psychiatric:        Mood and Affect: Mood normal.        Behavior: Behavior normal.   Assessment and Plan Right ear pain Most liely viral URI.  Recommend COVID19 testing.. pt agreeable.. info provide via [place].  No red flags, minimal symptoms.Marland Kitchen symptomatic care recommended.  Isolation until testing returns.. reviewed with pt.   Given ear pain.Marland Kitchen less likely bacterial infection.  Treat with mucinex DM, flonsae.. if not improving  In 48-72 hours consider in person OV at respiratory clinic for ear exam.     I discussed the assessment and treatment plan with the patient. The patient was provided an opportunity to ask questions and all were answered. The patient agreed with the plan and demonstrated an understanding of the instructions.   The patient was advised to call back or seek an  in-person evaluation if the symptoms worsen or if the condition fails to improve as anticipated.     Eliezer Lofts, MD

## 2019-03-24 NOTE — Patient Instructions (Addendum)
How to make an appointment for COVID testing:  TEXT: COVID to [id number]  or CALL: [phone] or  ONLINE: [email or web address]  Locations:  [place]: [place] Pharmacist, hospital [place]: [place] at [employer]  M-F  8 AM to 3:30 PM Holiday Hours: New Year's Eve - 8 a.m. - noon New Year's Day - Closed   Start mucinex DM twice daily, add flonase 2 sprays per nostril daily.   *Call for possible respiratory clinic OV if ear pain not improving in 48-72 hours  For in person ear exam.  Go to ER if SOb.

## 2019-03-25 DIAGNOSIS — J069 Acute upper respiratory infection, unspecified: Secondary | ICD-10-CM | POA: Diagnosis not present

## 2019-03-25 DIAGNOSIS — H9201 Otalgia, right ear: Secondary | ICD-10-CM | POA: Diagnosis not present

## 2019-03-25 DIAGNOSIS — Z20828 Contact with and (suspected) exposure to other viral communicable diseases: Secondary | ICD-10-CM | POA: Diagnosis not present

## 2019-03-27 DIAGNOSIS — G35 Multiple sclerosis: Secondary | ICD-10-CM | POA: Diagnosis not present

## 2019-03-27 DIAGNOSIS — F5104 Psychophysiologic insomnia: Secondary | ICD-10-CM | POA: Diagnosis not present

## 2019-03-27 DIAGNOSIS — M4802 Spinal stenosis, cervical region: Secondary | ICD-10-CM | POA: Diagnosis not present

## 2019-05-11 ENCOUNTER — Other Ambulatory Visit: Payer: Self-pay | Admitting: Gastroenterology

## 2019-05-18 ENCOUNTER — Other Ambulatory Visit: Payer: Self-pay | Admitting: Gastroenterology

## 2019-05-19 DIAGNOSIS — M4802 Spinal stenosis, cervical region: Secondary | ICD-10-CM | POA: Diagnosis not present

## 2019-05-29 ENCOUNTER — Other Ambulatory Visit: Payer: Self-pay | Admitting: Gastroenterology

## 2019-05-29 ENCOUNTER — Telehealth: Payer: Self-pay

## 2019-05-29 ENCOUNTER — Telehealth: Payer: Self-pay | Admitting: Family Medicine

## 2019-05-29 DIAGNOSIS — Z13220 Encounter for screening for lipoid disorders: Secondary | ICD-10-CM

## 2019-05-29 DIAGNOSIS — Z113 Encounter for screening for infections with a predominantly sexual mode of transmission: Secondary | ICD-10-CM

## 2019-05-29 NOTE — Telephone Encounter (Signed)
LVM to call clinic, needs COVID screen and front door info 3.12.2021 TLJ

## 2019-05-29 NOTE — Telephone Encounter (Signed)
-----   Message from Elie Confer sent at 05/14/2019  4:41 PM EST ----- Regarding: Labs Pt is wondering if he can have labs checked for STD's when he comes in for his CPE labs.  Trixie Rude

## 2019-06-01 ENCOUNTER — Other Ambulatory Visit (INDEPENDENT_AMBULATORY_CARE_PROVIDER_SITE_OTHER): Payer: BC Managed Care – PPO

## 2019-06-01 ENCOUNTER — Other Ambulatory Visit: Payer: Self-pay

## 2019-06-01 DIAGNOSIS — Z13220 Encounter for screening for lipoid disorders: Secondary | ICD-10-CM

## 2019-06-01 DIAGNOSIS — Z113 Encounter for screening for infections with a predominantly sexual mode of transmission: Secondary | ICD-10-CM | POA: Diagnosis not present

## 2019-06-01 LAB — COMPREHENSIVE METABOLIC PANEL
ALT: 28 U/L (ref 0–53)
AST: 19 U/L (ref 0–37)
Albumin: 3.9 g/dL (ref 3.5–5.2)
Alkaline Phosphatase: 54 U/L (ref 39–117)
BUN: 14 mg/dL (ref 6–23)
CO2: 30 mEq/L (ref 19–32)
Calcium: 8.9 mg/dL (ref 8.4–10.5)
Chloride: 103 mEq/L (ref 96–112)
Creatinine, Ser: 1.19 mg/dL (ref 0.40–1.50)
GFR: 63.57 mL/min (ref >60.00)
Glucose, Bld: 87 mg/dL (ref 70–99)
Potassium: 4.6 mEq/L (ref 3.5–5.1)
Sodium: 140 mEq/L (ref 135–145)
Total Bilirubin: 0.6 mg/dL (ref 0.2–1.2)
Total Protein: 6.1 g/dL (ref 6.0–8.3)

## 2019-06-01 LAB — LIPID PANEL
Cholesterol: 172 mg/dL (ref 0–200)
HDL: 37.6 mg/dL — ABNORMAL LOW (ref >39.00)
NonHDL: 134.33
Total CHOL/HDL Ratio: 5
Triglycerides: 203 mg/dL — ABNORMAL HIGH (ref 0.0–149.0)
VLDL: 40.6 mg/dL — ABNORMAL HIGH (ref 0.0–40.0)

## 2019-06-01 LAB — LDL CHOLESTEROL, DIRECT: Direct LDL: 103 mg/dL

## 2019-06-02 LAB — RPR: RPR Ser Ql: NONREACTIVE

## 2019-06-02 LAB — HEPATITIS PANEL, ACUTE
Hep A IgM: NONREACTIVE
Hep B C IgM: NONREACTIVE
Hepatitis B Surface Ag: NONREACTIVE
Hepatitis C Ab: NONREACTIVE
SIGNAL TO CUT-OFF: 0.01 (ref <1.00)

## 2019-06-02 LAB — HIV ANTIBODY (ROUTINE TESTING W REFLEX): HIV 1&2 Ab, 4th Generation: NONREACTIVE

## 2019-06-02 NOTE — Progress Notes (Signed)
No critical labs need to be addressed urgently. We will discuss labs in detail at upcoming office visit.   

## 2019-06-03 ENCOUNTER — Other Ambulatory Visit: Payer: Self-pay | Admitting: Gastroenterology

## 2019-06-03 ENCOUNTER — Telehealth: Payer: Self-pay | Admitting: Gastroenterology

## 2019-06-03 ENCOUNTER — Encounter: Payer: Self-pay | Admitting: Gastroenterology

## 2019-06-03 MED ORDER — BALSALAZIDE DISODIUM 750 MG PO CAPS: 2250.0000 mg | ORAL_CAPSULE | Freq: Three times a day (TID) | ORAL | 0 refills | Status: DC

## 2019-06-03 NOTE — Telephone Encounter (Signed)
Prescription sent to patient's pharmacy. Patient is scheduled for [place] in April.

## 2019-06-04 ENCOUNTER — Encounter: Payer: Self-pay | Admitting: Family Medicine

## 2019-06-04 ENCOUNTER — Ambulatory Visit (INDEPENDENT_AMBULATORY_CARE_PROVIDER_SITE_OTHER): Payer: BC Managed Care – PPO | Admitting: Family Medicine

## 2019-06-04 ENCOUNTER — Other Ambulatory Visit: Payer: Self-pay

## 2019-06-04 VITALS — BP 164/88 | HR 83 | Temp 98.3°F | Ht 66.75 in | Wt 179.5 lb

## 2019-06-04 DIAGNOSIS — K121 Other forms of stomatitis: Secondary | ICD-10-CM

## 2019-06-04 DIAGNOSIS — G35 Multiple sclerosis: Secondary | ICD-10-CM | POA: Diagnosis not present

## 2019-06-04 DIAGNOSIS — M4802 Spinal stenosis, cervical region: Secondary | ICD-10-CM | POA: Diagnosis not present

## 2019-06-04 DIAGNOSIS — K51319 Ulcerative (chronic) rectosigmoiditis with unspecified complications: Secondary | ICD-10-CM

## 2019-06-04 DIAGNOSIS — F5104 Psychophysiologic insomnia: Secondary | ICD-10-CM

## 2019-06-04 DIAGNOSIS — Z0000 Encounter for general adult medical examination without abnormal findings: Secondary | ICD-10-CM | POA: Diagnosis not present

## 2019-06-04 DIAGNOSIS — J4520 Mild intermittent asthma, uncomplicated: Secondary | ICD-10-CM

## 2019-06-04 DIAGNOSIS — I10 Essential (primary) hypertension: Secondary | ICD-10-CM

## 2019-06-04 DIAGNOSIS — E7800 Pure hypercholesterolemia, unspecified: Secondary | ICD-10-CM

## 2019-06-04 MED ORDER — ATORVASTATIN CALCIUM 40 MG PO TABS: 40.0000 mg | ORAL_TABLET | Freq: Every day | ORAL | 3 refills | Status: DC

## 2019-06-04 MED ORDER — LOSARTAN POTASSIUM 50 MG PO TABS: 50.0000 mg | ORAL_TABLET | Freq: Every day | ORAL | 3 refills | Status: DC

## 2019-06-04 MED ORDER — METOPROLOL TARTRATE 25 MG PO TABS: 12.5000 mg | ORAL_TABLET | Freq: Two times a day (BID) | ORAL | 3 refills | Status: DC

## 2019-06-04 NOTE — Patient Instructions (Addendum)
Restart healthy eating and continue regular exercise.  Restart losartan and if needed metoprolol.  Restart atorvastatin 40 mg daily.  Follow BP at home .. goal < 140/90.   Please stop at the lab to have labs drawn.

## 2019-06-04 NOTE — Progress Notes (Signed)
Chief Complaint  Patient presents with  . Annual Exam    History of Present Illness: HPI  The patient is here for annual wellness exam and preventative care.     Cervical spinal stenosis: followed by Dr. Lacinda Axon. LASt OV 05/19/2019 reviewed.  Stable using muscle relaxant prn.   Multiple sclerosis followed by Neurology Dr. Manuella Ghazi last OV from 03/27/2019 reviewed. Mild disease course. On no chronic meds.   Hypertension:  Not at goal in office today .Marland Kitchen NOT taking losartan and metoprolol... had been following at work .. had been controlled off medication. BP Readings from Last 3 Encounters:  06/04/19 (!) 164/88  01/06/18 (!) 142/84  08/10/17 (!) 160/90  Using medication without problems or lightheadedness:  occ Chest pain with exertion:none Edema:none Short of breath:none Average home BPs:  Recently increasing at home as well. Other issues:  CAD on 2018 cath  Mild intermittant asthma: stable control  Ulcerative colitis: Followed by GI Dr. Fuller Plan on [place] and balsalazide. Has upcoming colonscopy schedule  Elevated Cholesterol:  NO longer at goal LDL < 70 given CAD Lab Results  Component Value Date   CHOL 172 06/01/2019   HDL 37.60 (L) 06/01/2019   LDLCALC 79 11/23/2016   LDLDIRECT 103.0 06/01/2019   TRIG 203.0 (H) 06/01/2019   CHOLHDL 5 06/01/2019  Using medications without problems: Muscle aches:  Diet compliance:moderate Exercise: 6 days a week Other complaints:    STD screen requested... occ ulcers in mouth.    Office Visit from 06/04/2019 in [place] at [facility] Total Score  0       This visit occurred during the SARS-CoV-2 public health emergency.  Safety protocols were in place, including screening questions prior to the visit, additional usage of staff PPE, and extensive cleaning of exam room while observing appropriate contact time as indicated for disinfecting solutions.   COVID 19 screen:  No recent travel or known exposure to COVID19 The  patient denies respiratory symptoms of COVID 19 at this time. The importance of social distancing was discussed today.     Review of Systems  Constitutional: Negative for chills and fever.  HENT: Negative for congestion and ear pain.   Eyes: Negative for pain and redness.  Respiratory: Negative for cough and shortness of breath.   Cardiovascular: Negative for chest pain, palpitations and leg swelling.  Gastrointestinal: Negative for abdominal pain, blood in stool, constipation, diarrhea, nausea and vomiting.  Genitourinary: Negative for dysuria.  Musculoskeletal: Positive for back pain. Negative for falls and myalgias.  Skin: Negative for rash.  Neurological: Negative for dizziness.  Psychiatric/Behavioral: Negative for depression. The patient is not nervous/anxious.       Past Medical History:  Diagnosis Date  . Abnormal liver function test   . Allergic rhinitis   . Allergy   . Anxiety   . CAD (coronary artery disease) 09/01/2016   a.  09/01/16; NSTEMI LHC: 60-70% ostial LAD stenosis, 20% mid LAD, 30% RCA. EF 55-65%  b. 09/07/16 [place] s/p DES to oLAD  . DDD (degenerative disc disease)   . Depression   . GERD (gastroesophageal reflux disease)   . Hypertension    no meds  . Insomnia   . MS (multiple sclerosis) ([place])   . Ulcerative colitis    left sided    reports that he has never smoked. He has never used smokeless tobacco. He reports current alcohol use. He reports that he does not use drugs.   Current Outpatient Medications:  .  acetaminophen (TYLENOL) 500 MG tablet, Take 1,000 mg by mouth daily as needed for moderate pain or headache., Disp: , Rfl:  .  Ascorbic Acid (VITAMIN C) 1000 MG tablet, Take 1,000 mg by mouth daily., Disp: , Rfl:  .  aspirin 81 MG EC tablet, Take 1 tablet (81 mg total) by mouth daily., Disp: 30 tablet, Rfl: 11 .  balsalazide (COLAZAL) 750 MG capsule, Take 3 capsules (2,250 mg total) by mouth 3 (three) times daily., Disp: 270 capsule, Rfl: 0 .   Cholecalciferol (VITAMIN D3) 5000 units CAPS, Take 5,000 Units by mouth daily., Disp: , Rfl:  .  cyclobenzaprine (FLEXERIL) 5 MG tablet, Take by mouth., Disp: , Rfl:  .  diphenhydrAMINE (BENADRYL) 25 MG tablet, Take 50 mg by mouth daily as needed for allergies., Disp: , Rfl:  .  DiphenhydrAMINE HCl, Sleep, (ZZZQUIL PO), Take 1 Dose by mouth at bedtime as needed (sleep)., Disp: , Rfl:  .  esomeprazole (NEXIUM) 20 MG packet, Take 20 mg by mouth daily., Disp: , Rfl:  .  losartan (COZAAR) 50 MG tablet, Take 1 tablet (50 mg total) by mouth daily., Disp: 30 tablet, Rfl: 5 .  mesalamine (CANASA) 1000 MG suppository, INSERT 1  RECTALLY TWICE DAILY, Disp: 60 suppository, Rfl: 0 .  mesalamine (ROWASA) 4 g enema, INSERT 1 ENEMA RECTALLY AT BEDTIME FOR 14 DAYS, Disp: 840 mL, Rfl: 0 .  metoprolol tartrate (LOPRESSOR) 25 MG tablet, Take 0.5 tablets (12.5 mg total) by mouth 2 (two) times daily., Disp: 30 tablet, Rfl: 5 .  Misc Natural Products (OSTEO BI-FLEX TRIPLE STRENGTH PO), Take 2 tablets by mouth daily., Disp: , Rfl:  .  nitroGLYCERIN (NITROSTAT) 0.4 MG SL tablet, Place 1 tablet (0.4 mg total) under the tongue every 5 (five) minutes x 3 doses as needed for chest pain., Disp: 25 tablet, Rfl: 6 .  traZODone (DESYREL) 50 MG tablet, Take 0.5-1 tablets (25-50 mg total) by mouth at bedtime as needed for sleep., Disp: 30 tablet, Rfl: 3 .  zinc gluconate 50 MG tablet, Take 50 mg by mouth daily., Disp: , Rfl:    Observations/Objective: Blood pressure (!) 160/90, pulse 83, temperature 98.3 F (36.8 C), temperature source Temporal, height 5' 6.75" (1.695 m), weight 179 lb 8 oz (81.4 kg), SpO2 98 %.  Physical Exam Constitutional:      Appearance: He is well-developed.  HENT:     Head: Normocephalic.     Right Ear: Hearing normal.     Left Ear: Hearing normal.     Nose: Nose normal.  Neck:     Thyroid: No thyroid mass or thyromegaly.     Vascular: No carotid bruit.     Trachea: Trachea normal.   Cardiovascular:     Rate and Rhythm: Normal rate and regular rhythm.     Pulses: Normal pulses.     Heart sounds: Heart sounds not distant. No murmur. No friction rub. No gallop.      Comments: No peripheral edema Pulmonary:     Effort: Pulmonary effort is normal. No respiratory distress.     Breath sounds: Normal breath sounds.  Skin:    General: Skin is warm and dry.     Findings: No rash.  Psychiatric:        Speech: Speech normal.        Behavior: Behavior normal.        Thought Content: Thought content normal.      Assessment and Plan   The patient's preventative maintenance and  recommended screening tests for an annual wellness exam were reviewed in full today. Brought up to date unless services declined.  Counselled on the importance of diet, exercise, and its role in overall health and mortality. The patient's FH and SH was reviewed, including their home life, tobacco status, and drug and alcohol status.   Colonoscopy per GI q 5 years Vaccines:  uptodate with td, refused flu in past. Recommended COVID19 vaccine  STD screen performed  ETOH/drugs: rare/none  nonsmoker  Cervical spinal stenosis followed by Dr. Lacinda Axon. LASt OV 05/19/2019 reviewed.  Stable using muscle relaxant prn.   Multiple sclerosis ([place]) Followed by Dr. Manuella Ghazi.  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 Fuller Plan  Essential hypertension, benign Not well controlled off medications.. restart.  Mild intermittent asthma Stable control.  Chronic insomnia Well controlled on trazodone.  Depression, major, single episode, complete remission ([place])  Doing well in remission.  High cholesterol Restart atorvastatin 40 mg daily given CAD.   Eliezer Lofts, MD

## 2019-06-04 NOTE — Assessment & Plan Note (Signed)
Followed by Dr. Manuella Ghazi.

## 2019-06-04 NOTE — Assessment & Plan Note (Signed)
Restart atorvastatin 40 mg daily given CAD.

## 2019-06-04 NOTE — Assessment & Plan Note (Signed)
Doing well in remission.

## 2019-06-04 NOTE — Assessment & Plan Note (Signed)
Well controlled on trazodone.

## 2019-06-04 NOTE — Assessment & Plan Note (Signed)
followed by Dr. Lacinda Axon. LASt OV 05/19/2019 reviewed.  Stable using muscle relaxant prn.

## 2019-06-04 NOTE — Assessment & Plan Note (Addendum)
Followed by GI Dr. Fuller Plan

## 2019-06-04 NOTE — Assessment & Plan Note (Signed)
Stable control. 

## 2019-06-04 NOTE — Assessment & Plan Note (Signed)
Not well controlled off medications.. restart.

## 2019-06-08 LAB — HSV(HERPES SIMPLEX VRS) I + II AB-IGG
HAV 1 IGG,TYPE SPECIFIC AB: 31.8 index — ABNORMAL HIGH
HSV 2 IGG,TYPE SPECIFIC AB: <0.9 index

## 2019-06-08 LAB — HSV 1/2 AB (IGM), IFA W/RFLX TITER
HSV 1 IgM Screen: NEGATIVE
HSV 2 IgM Screen: NEGATIVE

## 2019-06-12 ENCOUNTER — Telehealth: Payer: Self-pay | Admitting: Family Medicine

## 2019-06-12 NOTE — Telephone Encounter (Signed)
Pt called wanting to get a referral for back pain.  He stated chiropractor is not helping.  He saw you 3/18 and talk to you about it  Pt stated it hurts to walk now Does pt need to see your prior

## 2019-06-12 NOTE — Telephone Encounter (Signed)
I sent pt a Mychart message.

## 2019-06-15 ENCOUNTER — Other Ambulatory Visit: Payer: Self-pay | Admitting: Family Medicine

## 2019-06-15 DIAGNOSIS — M545 Low back pain, unspecified: Secondary | ICD-10-CM

## 2019-06-24 ENCOUNTER — Other Ambulatory Visit: Payer: Self-pay | Admitting: Student

## 2019-06-24 DIAGNOSIS — G8929 Other chronic pain: Secondary | ICD-10-CM

## 2019-06-24 DIAGNOSIS — M5441 Lumbago with sciatica, right side: Secondary | ICD-10-CM

## 2019-07-02 ENCOUNTER — Telehealth: Payer: Self-pay | Admitting: Cardiovascular Disease

## 2019-07-02 ENCOUNTER — Other Ambulatory Visit: Payer: Self-pay

## 2019-07-02 ENCOUNTER — Ambulatory Visit (AMBULATORY_SURGERY_CENTER): Payer: Self-pay | Admitting: *Deleted

## 2019-07-02 VITALS — Temp 97.1°F | Ht 66.0 in | Wt 179.4 lb

## 2019-07-02 DIAGNOSIS — K51319 Ulcerative (chronic) rectosigmoiditis with unspecified complications: Secondary | ICD-10-CM

## 2019-07-02 DIAGNOSIS — Z01818 Encounter for other preprocedural examination: Secondary | ICD-10-CM

## 2019-07-02 MED ORDER — SUPREP BOWEL PREP KIT 17.5-3.13-1.6 GM/177ML PO SOLN: ORAL | 0 refills | Status: DC

## 2019-07-02 NOTE — Telephone Encounter (Signed)
New Message     Pt is calling and says he is having an MRI done. He says he has a stent placement card but it is not on the card where the stent is located Pt says the date of the implant was 09/07/16      Please call

## 2019-07-02 NOTE — Progress Notes (Signed)
covid test 07-13-19 at 12:30 pm  Pt is aware that care partner will wait in the car during procedure; if they feel like they will be too hot or cold to wait in the car; they may wait in the 4 th floor lobby. Patient is aware to bring only one care partner. We want them to wear a mask (we do not have any that we can provide them), practice social distancing, and we will check their temperatures when they get here.  I did remind the patient that their care partner needs to stay in the parking lot the entire time and have a cell phone available, we will call them when the pt is ready for discharge. Patient will wear mask into building.   No trouble with anesthesia, difficulty with intubation or hx/fam hx of malignant hyperthermia per pt   No egg or soy allergy  No home oxygen use   No medications for weight loss taken  emmi information given  Pt denies constipation issues  Suprep coupon given and code put into RX

## 2019-07-02 NOTE — Telephone Encounter (Signed)
The stent is at the ostium of the LAD

## 2019-07-02 NOTE — Telephone Encounter (Signed)
Lvm2cb 4/15

## 2019-07-03 NOTE — Telephone Encounter (Signed)
Called and spoke with pt and notified of where his stent was placed per Dr.Kelly. Pt also asking for name of the stent, model, lot, and serial number.  Gave pt the bellow information. Notified if there were any issues to contact our office. Pt verbalized understanding with no other questions at this time.   Inventory item: Leonie Douglas ONYX 4.0X8 Model/Cat number: [id number]    Lot number: [id number]  Device identifier: [id number]

## 2019-07-07 ENCOUNTER — Ambulatory Visit: Payer: BC Managed Care – PPO

## 2019-07-13 ENCOUNTER — Other Ambulatory Visit: Payer: Self-pay | Admitting: Gastroenterology

## 2019-07-13 ENCOUNTER — Ambulatory Visit (INDEPENDENT_AMBULATORY_CARE_PROVIDER_SITE_OTHER): Payer: BC Managed Care – PPO

## 2019-07-13 ENCOUNTER — Other Ambulatory Visit: Payer: Self-pay

## 2019-07-13 DIAGNOSIS — Z1159 Encounter for screening for other viral diseases: Secondary | ICD-10-CM

## 2019-07-14 ENCOUNTER — Encounter: Payer: Self-pay | Admitting: Gastroenterology

## 2019-07-14 LAB — SARS CORONAVIRUS 2 (TAT 6-24 HRS): SARS Coronavirus 2: NEGATIVE

## 2019-07-15 ENCOUNTER — Other Ambulatory Visit: Payer: Self-pay | Admitting: Student

## 2019-07-15 DIAGNOSIS — M5416 Radiculopathy, lumbar region: Secondary | ICD-10-CM

## 2019-07-16 ENCOUNTER — Ambulatory Visit (AMBULATORY_SURGERY_CENTER): Payer: BC Managed Care – PPO | Admitting: Gastroenterology

## 2019-07-16 ENCOUNTER — Encounter: Payer: Self-pay | Admitting: Gastroenterology

## 2019-07-16 ENCOUNTER — Other Ambulatory Visit: Payer: Self-pay

## 2019-07-16 ENCOUNTER — Telehealth: Payer: Self-pay

## 2019-07-16 VITALS — BP 88/63 | HR 72 | Temp 96.2°F | Resp 17 | Ht 66.0 in | Wt 179.0 lb

## 2019-07-16 DIAGNOSIS — K621 Rectal polyp: Secondary | ICD-10-CM | POA: Diagnosis not present

## 2019-07-16 DIAGNOSIS — K51319 Ulcerative (chronic) rectosigmoiditis with unspecified complications: Secondary | ICD-10-CM | POA: Diagnosis not present

## 2019-07-16 DIAGNOSIS — K529 Noninfective gastroenteritis and colitis, unspecified: Secondary | ICD-10-CM | POA: Diagnosis not present

## 2019-07-16 DIAGNOSIS — D128 Benign neoplasm of rectum: Secondary | ICD-10-CM

## 2019-07-16 MED ORDER — SODIUM CHLORIDE 0.9 % IV SOLN: 500.0000 mL | Freq: Once | INTRAVENOUS | Status: DC

## 2019-07-16 MED ORDER — BALSALAZIDE DISODIUM 750 MG PO CAPS: 2250.0000 mg | ORAL_CAPSULE | Freq: Three times a day (TID) | ORAL | 11 refills | Status: AC

## 2019-07-16 NOTE — Op Note (Signed)
[place] Patient Name: John Kelly Procedure Date: 07/16/2019 9:06 AM MRN: [id number] Endoscopist: Ladene Artist , MD Age: 54 Referring MD:  Date of Birth: [birth year] Gender: Male Account #: [id number] Procedure:                Colonoscopy Indications:              High risk colon cancer surveillance: Ulcerative                            left sided colitis of 8 (or more) years duration Medicines:                Monitored Anesthesia Care Procedure:                Pre-Anesthesia Assessment:                           - Prior to the procedure, a History and Physical                            was performed, and patient medications and                            allergies were reviewed. The patient's tolerance of                            previous anesthesia was also reviewed. The risks                            and benefits of the procedure and the sedation                            options and risks were discussed with the patient.                            All questions were answered, and informed consent                            was obtained. Prior Anticoagulants: The patient has                            taken no previous anticoagulant or antiplatelet                            agents. ASA Grade Assessment: II - A patient with                            mild systemic disease. After reviewing the risks                            and benefits, the patient was deemed in                            satisfactory condition to undergo the procedure.  After obtaining informed consent, the colonoscope                            was passed under direct vision. Throughout the                            procedure, the patient's blood pressure, pulse, and                            oxygen saturations were monitored continuously. The                            Colonoscope was introduced through the anus and                            advanced to the  the cecum, identified by                            appendiceal orifice and ileocecal valve. The                            ileocecal valve, appendiceal orifice, and rectum                            were photographed. The quality of the bowel                            preparation was good. The colonoscopy was performed                            without difficulty. The patient tolerated the                            procedure well. Scope In: 9:08:48 AM Scope Out: 9:30:24 AM Scope Withdrawal Time: 0 hours 19 minutes 8 seconds  Total Procedure Duration: 0 hours 21 minutes 36 seconds  Findings:                 The perianal and digital rectal examinations were                            normal.                           Five sessile polyps vs submucosal lesions were                            found in the rectum. They were were 6 to 8 mm in                            size. These polyps were removed with a hot snare                            after unable to remove with cold snare. Resection  and retrieval were complete.                           Inflammation characterized by congestion (edema),                            erythema and granularity was found in a continuous                            and circumferential pattern from the rectum to the                            sigmoid colon. The entire remainder of the colon                            was spared. This was mild in severity, and when                            compared to previous examinations, the findings are                            unchanged. Biopsies were taken with a cold forceps                            for histology.                           The exam was otherwise without abnormality on                            direct and retroflexion views. Biopsies were taken                            with a cold forceps for histology. Complications:            No immediate complications. Estimated  blood loss:                            None. Estimated Blood Loss:     Estimated blood loss: none. Impression:               - Five 6 to 8 mm polyps in the rectum, removed with                            a hot snare. Resected and retrieved.                           - Proctosigmoid colitis. Inflammation was found                            from the rectum to the sigmoid colon. This was mild                            in severity, unchanged compared to previous  examinations. Biopsied.                           - The examination was otherwise normal on direct                            and retroflexion views. Recommendation:           - Repeat colonoscopy, likely in 2-3 years, for                            surveillance based on pathology results.                           - Patient has a contact number available for                            emergencies. The signs and symptoms of potential                            delayed complications were discussed with the                            patient. Return to normal activities tomorrow.                            Written discharge instructions were provided to the                            patient.                           - Resume previous diet.                           - Continue present medications.                           - Await pathology results.                           - No aspirin, ibuprofen, naproxen, or other                            non-steroidal anti-inflammatory drugs for 2 weeks                            after polyp removal. Ladene Artist, MD 07/16/2019 9:36:33 AM This report has been signed electronically.

## 2019-07-16 NOTE — Progress Notes (Signed)
PT taken to PACU. Monitors in place. VSS. Report given to RN. 

## 2019-07-16 NOTE — Progress Notes (Signed)
Vitals-KA Temp-JC

## 2019-07-16 NOTE — Telephone Encounter (Signed)
-----   Message from Ladene Artist, MD sent at 07/16/2019 10:06 AM EDT ----- balsalazide (COLAZAL) 750 MG capsule 3 po tid, 1 year of refills

## 2019-07-16 NOTE — Patient Instructions (Signed)
Await pathology results.  Avoid NSAIDS (Aspirin, Ibuprofen, Aleve, Naproxen), you may use Tylenol as needed for 2 weeks post polyp removal.  Resume regular diet and medications.  YOU HAD AN ENDOSCOPIC PROCEDURE TODAY AT [facility]:   Refer to the procedure report that was given to you for any specific questions about what was found during the examination.  If the procedure report does not answer your questions, please call your gastroenterologist to clarify.  If you requested that your care partner not be given the details of your procedure findings, then the procedure report has been included in a sealed envelope for you to review at your convenience later.  YOU SHOULD EXPECT: Some feelings of bloating in the abdomen. Passage of more gas than usual.  Walking can help get rid of the air that was put into your GI tract during the procedure and reduce the bloating. If you had a lower endoscopy (such as a colonoscopy or flexible sigmoidoscopy) you may notice spotting of blood in your stool or on the toilet paper. If you underwent a bowel prep for your procedure, you may not have a normal bowel movement for a few days.  Please Note:  You might notice some irritation and congestion in your nose or some drainage.  This is from the oxygen used during your procedure.  There is no need for concern and it should clear up in a day or so.  SYMPTOMS TO REPORT IMMEDIATELY:   Following lower endoscopy (colonoscopy or flexible sigmoidoscopy):  Excessive amounts of blood in the stool  Significant tenderness or worsening of abdominal pains  Swelling of the abdomen that is new, acute  Fever of 100F or higher   For urgent or emergent issues, a gastroenterologist can be reached at any hour by calling [phone]. Do not use MyChart messaging for urgent concerns.    DIET:  We do recommend a small meal at first, but then you may proceed to your regular diet.  Drink plenty of fluids but you  should avoid alcoholic beverages for 24 hours.  ACTIVITY:  You should plan to take it easy for the rest of today and you should NOT DRIVE or use heavy machinery until tomorrow (because of the sedation medicines used during the test).    FOLLOW UP: Our staff will call the number listed on your records 48-72 hours following your procedure to check on you and address any questions or concerns that you may have regarding the information given to you following your procedure. If we do not reach you, we will leave a message.  We will attempt to reach you two times.  During this call, we will ask if you have developed any symptoms of COVID 19. If you develop any symptoms (ie: fever, flu-like symptoms, shortness of breath, cough etc.) before then, please call [phone].  If you test positive for Covid 19 in the 2 weeks post procedure, please call and report this information to [place].    If any biopsies were taken you will be contacted by phone or by letter within the next 1-3 weeks.  Please call us at [phone] if you have not heard about the biopsies in 3 weeks.    SIGNATURES/CONFIDENTIALITY: You and/or your care partner have signed paperwork which will be entered into your electronic medical record.  These signatures attest to the fact that that the information above on your After Visit Summary has been reviewed and is understood.  Full responsibility of the  confidentiality of this discharge information lies with you and/or your care-partner.

## 2019-07-16 NOTE — Progress Notes (Signed)
Called to room to assist during endoscopic procedure.  Patient ID and intended procedure confirmed with present staff. Received instructions for my participation in the procedure from the performing physician.  

## 2019-07-20 ENCOUNTER — Telehealth: Payer: Self-pay

## 2019-07-20 NOTE — Telephone Encounter (Signed)
  Follow up Call-  Call back number 07/16/2019  Post procedure Call Back phone  # [phone]  Permission to leave phone message Yes  Some recent data might be hidden     Patient questions:  Do you have a fever, pain , or abdominal swelling? No. Pain Score  0 *  Have you tolerated food without any problems? Yes.    Have you been able to return to your normal activities? Yes.    Do you have any questions about your discharge instructions: Diet   No. Medications  No. Follow up visit  No.  Do you have questions or concerns about your Care? No.  Actions: * If pain score is 4 or above: No action needed, pain <4. 1. Have you developed a fever since your procedure? no  2.   Have you had an respiratory symptoms (SOB or cough) since your procedure? no  3.   Have you tested positive for COVID 19 since your procedure no  4.   Have you had any family members/close contacts diagnosed with the COVID 19 since your procedure?  no   If yes to any of these questions please route to Joylene John, RN and Erenest Rasher, RN

## 2019-07-21 ENCOUNTER — Other Ambulatory Visit: Payer: Self-pay

## 2019-07-21 ENCOUNTER — Ambulatory Visit
Admission: RE | Admit: 2019-07-21 | Discharge: 2019-07-21 | Disposition: A | Discharge status: Home / Self Care | Payer: BC Managed Care – PPO | Source: Ambulatory Visit | Attending: Student | Admitting: Student

## 2019-07-21 DIAGNOSIS — M5416 Radiculopathy, lumbar region: Secondary | ICD-10-CM

## 2019-07-21 MED ORDER — IOPAMIDOL (ISOVUE-M 200) INJECTION 41%
1.0000 mL | Freq: Once | INTRAMUSCULAR | Status: AC
  Administered 2019-07-21: 1 mL via EPIDURAL

## 2019-07-21 MED ORDER — METHYLPREDNISOLONE ACETATE 40 MG/ML INJ SUSP (RADIOLOG
120.0000 mg | Freq: Once | INTRAMUSCULAR | Status: AC
  Administered 2019-07-21: 120 mg via EPIDURAL

## 2019-07-21 NOTE — Discharge Instructions (Signed)

## 2019-07-27 ENCOUNTER — Encounter: Payer: Self-pay | Admitting: Gastroenterology

## 2019-08-12 ENCOUNTER — Other Ambulatory Visit: Payer: Self-pay | Admitting: Gastroenterology

## 2019-08-12 ENCOUNTER — Telehealth: Payer: Self-pay | Admitting: Gastroenterology

## 2019-08-12 MED ORDER — MESALAMINE 1000 MG RE SUPP: 1000.0000 mg | Freq: Every day | RECTAL | 1 refills | Status: DC

## 2019-08-12 NOTE — Telephone Encounter (Signed)
Corrected prescription and sent to pharmacy.

## 2019-09-02 ENCOUNTER — Other Ambulatory Visit: Payer: Self-pay | Admitting: *Deleted

## 2019-09-02 MED ORDER — TRAZODONE HCL 50 MG PO TABS: 25.0000 mg | ORAL_TABLET | Freq: Every evening | ORAL | 3 refills | Status: DC | PRN

## 2019-09-04 ENCOUNTER — Other Ambulatory Visit: Payer: Self-pay | Admitting: Family Medicine

## 2019-09-30 ENCOUNTER — Other Ambulatory Visit: Payer: Self-pay | Admitting: Nurse Practitioner

## 2019-09-30 DIAGNOSIS — M5416 Radiculopathy, lumbar region: Secondary | ICD-10-CM

## 2019-10-07 ENCOUNTER — Other Ambulatory Visit: Payer: BC Managed Care – PPO

## 2019-10-07 ENCOUNTER — Ambulatory Visit
Admission: RE | Admit: 2019-10-07 | Discharge: 2019-10-07 | Disposition: A | Discharge status: Home / Self Care | Payer: BC Managed Care – PPO | Source: Ambulatory Visit | Attending: Nurse Practitioner | Admitting: Nurse Practitioner

## 2019-10-07 DIAGNOSIS — M5416 Radiculopathy, lumbar region: Secondary | ICD-10-CM

## 2019-10-07 MED ORDER — METHYLPREDNISOLONE ACETATE 40 MG/ML INJ SUSP (RADIOLOG
120.0000 mg | Freq: Once | INTRAMUSCULAR | Status: AC
  Administered 2019-10-07: 120 mg via EPIDURAL

## 2019-10-07 MED ORDER — IOPAMIDOL (ISOVUE-M 200) INJECTION 41%
1.0000 mL | Freq: Once | INTRAMUSCULAR | Status: AC
  Administered 2019-10-07: 1 mL via EPIDURAL

## 2019-10-07 NOTE — Discharge Instructions (Signed)

## 2019-10-23 ENCOUNTER — Other Ambulatory Visit: Payer: Self-pay | Admitting: Gastroenterology

## 2019-11-16 ENCOUNTER — Ambulatory Visit (INDEPENDENT_AMBULATORY_CARE_PROVIDER_SITE_OTHER): Payer: BC Managed Care – PPO | Admitting: Cardiovascular Disease

## 2019-11-16 ENCOUNTER — Encounter: Payer: Self-pay | Admitting: Cardiovascular Disease

## 2019-11-16 ENCOUNTER — Other Ambulatory Visit: Payer: Self-pay

## 2019-11-16 DIAGNOSIS — I10 Essential (primary) hypertension: Secondary | ICD-10-CM

## 2019-11-16 DIAGNOSIS — Z9861 Coronary angioplasty status: Secondary | ICD-10-CM

## 2019-11-16 DIAGNOSIS — I2510 Atherosclerotic heart disease of native coronary artery without angina pectoris: Secondary | ICD-10-CM | POA: Diagnosis not present

## 2019-11-16 DIAGNOSIS — G35 Multiple sclerosis: Secondary | ICD-10-CM

## 2019-11-16 DIAGNOSIS — E785 Hyperlipidemia, unspecified: Secondary | ICD-10-CM

## 2019-11-16 MED ORDER — NITROGLYCERIN 0.4 MG SL SUBL: 0.4000 mg | SUBLINGUAL_TABLET | SUBLINGUAL | 6 refills | Status: DC | PRN

## 2019-11-16 MED ORDER — METOPROLOL TARTRATE 25 MG PO TABS
25.0000 mg | ORAL_TABLET | Freq: Two times a day (BID) | ORAL | 3 refills | Status: DC
Start: 2019-11-16 — End: 2021-04-27

## 2019-11-16 NOTE — Progress Notes (Signed)
Have not seen him since 2018    Cardiology Office Note    Date:  11/22/2019   ID:  John Kelly, DOB 1965-12-25, MRN [id number]  PCP:  Jinny Sanders, MD  Cardiologist:  Shelva Majestic, MD   No chief complaint on file.   History of Present Illness:  John Kelly is a 54 y.o. male who presents for 56 month follow-up cardiology evaluation.  John Kelly has history of hypertension and multiple sclerosis.  On 09/01/2016 while working in the yard in significant heat he developed new onset chest pressure.  He presented to the ER was found to have 0.5 mm J-point elevation in leads V3 through V6.  He develop new transient right bundle branch block.  An idioventricular rhythm.  He continued to have chest pain which ultimately improved with IV nitroglycerin and heparin.  Troponin was mildly elevated.  He was taken to the catheterization laboratory and was found to preserved LV function with an EF of 60% with subtle area of mild focal mid anterolateral hypocontractility.  There was an ostial LAD stenosis of 60-70%, which improved slightly following IC nitroglycerin administration and he had mild 20% proximal LAD stenoses.  Had a normal codominant large left circumflex flex coronary artery.  His RCA at 30% smooth proximal stenosis.  Since he was pain-free and had normal flow the initial plan was to attempt medical therapy with discontinuance of his diuretic and initiation of beta blocker and amlodipine in addition to atorvastatin 80 mg.  He developed recurrent chest pain and on June 22 underwent stenting to his LAD ostium by Dr. Julianne Handler with insertion of a 4.08 mm Resolute onyx stent.  There was slight plaque shift into the ostium of the circumflex, but excellent flow down both vessels.  He was discharged following day.  He was was seen for office evaluation on 09/18/2016 by Almyra Deforest, PA.  Laboratory in September showed improvement in his lipids with total cholesterol 140, LDL 79, triglycerides 89, HDL 43.  He  has been on atorvastatin 80 mg and Zetia 10 mg was added to his regimen.    I had not seen him for initial office evaluation until October 2018 o when he had described  mild lightheadedness with bending over.  He also has experienced some nonexertional chest pain over the past several weeks, but he was uncertain if this was related to his multiple sclerosis.    He underwent a nuclear perfusion study which showed an EF of 55%.  Perfusion was normal without scar or skiing.  Anemia.  Laboratory revealed a total cholesterol 140, LDL remain greater than 70 with an LDL at 79.  LFTs were essentially normal, although ALT was 45 with upper normal being 44.  I saw him for 1 month follow-up evaluation in November 2018 and over the prior several weeks he had remained stable.  He specifically denied any recurrent chest pain or palpitations and was feeling better.  H  In follow-up of his recent laboratory Zetia had been called in and he had just started taking 10 mg in addition to his atorvastatin.  Since I last saw him, he has remained fairly well and denies chest tightness or shortness of breath.  He is followed by Dr. Manuella Ghazi at [facility] for his multiple sclerosis.  He recently saw [name] for annual wellness exam and preventive care.  During her evaluation his blood pressure was elevated at 164/88.  He denies any palpitations.  He presents for cardiology evaluation with me.  Past Medical History:  Diagnosis Date   Abnormal liver function test    Allergic rhinitis    Allergy    Anxiety    CAD (coronary artery disease) 09/01/2016   a.  09/01/16; NSTEMI LHC: 60-70% ostial LAD stenosis, 20% mid LAD, 30% RCA. EF 55-65%  b. 09/07/16 [place] s/p DES to oLAD   DDD (degenerative disc disease)    Depression    GERD (gastroesophageal reflux disease)    Hyperlipidemia    Hypertension    no meds   Insomnia    MS (multiple sclerosis) (HCC)    Myocardial infarction ([place])    2018   Neuromuscular disorder ([place])     MS   Ulcerative colitis    left sided    Past Surgical History:  Procedure Laterality Date   COLONOSCOPY     CORONARY ANGIOGRAPHY N/A 09/07/2016   Procedure: Coronary Angiography;  Surgeon: Burnell Blanks, MD;  Location: [place] [facility];  Service: Cardiovascular;  Laterality: N/A;   CORONARY STENT INTERVENTION N/A 09/07/2016   Procedure: Coronary Stent Intervention;  Surgeon: Burnell Blanks, MD;  Location: [place] [facility];  Service: Cardiovascular;  Laterality: N/A;   Inguinal Herniorrhapy     Right and left   LEFT HEART CATH AND CORONARY ANGIOGRAPHY N/A 09/01/2016   Procedure: Left Heart Cath and Coronary Angiography;  Surgeon: Troy Sine, MD;  Location: [place] [facility];  Service: Cardiovascular;  Laterality: N/A;   LEFT HEART CATH AND CORONARY ANGIOGRAPHY N/A 09/07/2016   Procedure: Left Heart Cath and Coronary Angiography;  Surgeon: Burnell Blanks, MD;  Location: [place] [facility];  Service: Cardiovascular;  Laterality: N/A;   PYLOROPLASTY      Current Medications: Outpatient Medications Prior to Visit  Medication Sig Dispense Refill   acetaminophen (TYLENOL) 500 MG tablet Take 1,000 mg by mouth daily as needed for moderate pain or headache.     Ascorbic Acid (VITAMIN C) 1000 MG tablet Take 1,000 mg by mouth daily.     aspirin 81 MG EC tablet Take 1 tablet (81 mg total) by mouth daily. 30 tablet 11   atorvastatin (LIPITOR) 40 MG tablet Take 1 tablet (40 mg total) by mouth daily. 90 tablet 3   balsalazide (COLAZAL) 750 MG capsule Take 3 capsules (2,250 mg total) by mouth 3 (three) times daily. 270 capsule 11   Cholecalciferol (VITAMIN D3) 5000 units CAPS Take 5,000 Units by mouth daily.     cyclobenzaprine (FLEXERIL) 5 MG tablet Take by mouth.     diphenhydrAMINE (BENADRYL) 25 MG tablet Take 50 mg by mouth daily as needed for allergies.     DiphenhydrAMINE HCl, Sleep, (ZZZQUIL PO) Take 1 Dose by mouth at bedtime as needed  (sleep).     esomeprazole (NEXIUM) 20 MG packet Take 20 mg by mouth daily.     losartan (COZAAR) 50 MG tablet Take 1 tablet (50 mg total) by mouth daily. 90 tablet 3   mesalamine (CANASA) 1000 MG suppository INSERT 1 SUPPOSITORY RECTALLY ONCE DAILY 30 suppository 0   mesalamine (ROWASA) 4 g enema INSERT 1 ENEMA RECTALLY AT BEDTIME FOR 14 DAYS 840 mL 0   Misc Natural Products (OSTEO BI-FLEX TRIPLE STRENGTH PO) Take 2 tablets by mouth daily.     traZODone (DESYREL) 50 MG tablet Take 0.5-1 tablets (25-50 mg total) by mouth at bedtime as needed for sleep. 30 tablet 3   zinc gluconate 50 MG tablet Take 50 mg by mouth daily.  metoprolol tartrate (LOPRESSOR) 25 MG tablet Take 0.5 tablets (12.5 mg total) by mouth 2 (two) times daily. 90 tablet 3   nitroGLYCERIN (NITROSTAT) 0.4 MG SL tablet Place 1 tablet (0.4 mg total) under the tongue every 5 (five) minutes x 3 doses as needed for chest pain. 25 tablet 6   No facility-administered medications prior to visit.     Allergies:   Diclofenac sodium, Skelaxin [metaxalone], and Trazodone and nefazodone   Social History   Socioeconomic History   Marital status: Divorced    Spouse name: Not on file   Number of children: 2   Years of education: Not on file   Highest education level: Not on file  Occupational History   Occupation: Steel distribution    Employer: [employer]  Tobacco Use   Smoking status: Never Smoker   Smokeless tobacco: Never Used  Vaping Use   Vaping Use: Never used  Substance and Sexual Activity   Alcohol use: Yes    Alcohol/week: 0.0 standard drinks    Comment: rarely   Drug use: No   Sexual activity: Not Currently  Other Topics Concern   Not on file  Social History Narrative   HSG   Divorced, not sexually active   1 son- '92, 1 daughter - '97   Work: Lobbyist- shipping/ rec'ing; quality management   Patient has never smoked   Alcohol use- rare 1-2 a month   Illicit Drug use- no      Patient gets regular exercise.   Social Determinants of Health   Financial Resource Strain:    Difficulty of Paying Living Expenses: Not on file  Food Insecurity:    Worried About Charity fundraiser in the Last Year: Not on file   [employer] of Food in the Last Year: Not on file  Transportation Needs:    Lack of Transportation (Medical): Not on file   Lack of Transportation (Non-Medical): Not on file  Physical Activity:    Days of Exercise per Week: Not on file   Minutes of Exercise per Session: Not on file  Stress:    Feeling of Stress : Not on file  Social Connections:    Frequency of Communication with Friends and Family: Not on file   Frequency of Social Gatherings with Friends and Family: Not on file   Attends Religious Services: Not on file   Active Member of Clubs or Organizations: Not on file   Attends Archivist Meetings: Not on file   Marital Status: Not on file     Family History:  The patient's family history includes Colitis in his mother; Coronary artery disease in his brother, father, and mother; Diabetes in his brother and father; Heart attack in his father and mother; Heart attack (age of onset: 8) in his brother; Hyperlipidemia in his father and mother; Hypertension in his brother, father, mother, and sister; Transient ischemic attack in his sister.   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   PHYSICAL EXAM:   VS:  BP (!) 142/86    Pulse 80    Ht 5' 6.75" (1.695 m)    Wt 178 lb (80.7 kg)    SpO2 96%    BMI 28.09 kg/m     Repeat blood pressure by me was 150/90  Wt Readings from Last 3 Encounters:  11/16/19 178 lb (80.7 kg)  07/16/19 179 lb (81.2 kg)  07/02/19 179 lb 6.4 oz (81.4 kg)    General: Alert, oriented, no distress.  Skin: normal turgor, no rashes, warm and dry HEENT: Normocephalic, atraumatic. Pupils equal round and reactive to light; sclera anicteric; extraocular muscles intact;  Nose without nasal septal hypertrophy Mouth/Parynx benign; Mallinpatti scale 3 Neck: No JVD, no carotid bruits; normal carotid upstroke Lungs: clear to ausculatation and percussion; no wheezing or rales Chest wall: without tenderness to palpitation Heart: PMI not displaced, RRR, s1 s2 normal, 1/6 systolic murmur, no diastolic murmur, no rubs, gallops, thrills, or heaves Abdomen: soft, nontender; no hepatosplenomehaly, BS+; abdominal aorta nontender and not dilated by palpation. Back: no CVA tenderness Pulses 2+ Musculoskeletal: full range of motion, normal strength, no joint deformities Extremities: no clubbing cyanosis or edema, Homan's sign negative  Neurologic: grossly nonfocal; Cranial nerves grossly wnl Psychologic: Normal mood and affect   Studies/Labs Reviewed:   ECG (independently read by me): Normal sinus rhythm at 80 bpm.  No ectopy.  Normal intervals  November 2018 ECG (independently read by me): Normal sinus rhythm at 67 bpm no ectopy  EKG:  EKG is ordered today. Normal sinus rhythm at 69 bpm.  Normal intervals.  No ectopy.  Nondagnostic T changes in lead  3.  Recent Labs: BMP Latest Ref Rng & Units 11/19/2019 06/01/2019 04/01/2017  Glucose 65 - 99 mg/dL 90 87 94  BUN 6 - 24 mg/dL 15 14 9   Creatinine 0.76 - 1.27 mg/dL 1.31(H) 1.19 1.24  BUN/Creat Ratio 9 - 20 11 - -  Sodium 134 - 144 mmol/L 139 140 140  Potassium 3.5 - 5.2 mmol/L 4.9 4.6 3.8  Chloride 96 - 106 mmol/L 103 103 106  CO2 20 - 29 mmol/L 25 30 29   Calcium 8.7 - 10.2 mg/dL 9.0 8.9 8.6     Hepatic Function Latest Ref Rng & Units 11/19/2019 06/01/2019 04/01/2017  Total Protein 6.0 - 8.5 g/dL 6.4 6.1 6.4  Albumin 3.8 - 4.9 g/dL 4.2 3.9 4.1  AST 0 - 40 IU/L 27 19 21   ALT 0 - 44 IU/L 29 28 24   Alk Phosphatase 48 - 121 IU/L 67 54 61  Total Bilirubin 0.0 - 1.2 mg/dL 1.0 0.6 0.6  Bilirubin, Direct 0.00 - 0.40 mg/dL - - -    CBC Latest Ref Rng & Units 11/19/2019 04/01/2017 09/08/2016  WBC 3.4 - 10.8 x10E3/uL 7.0 6.5 6.6  Hemoglobin 13.0 - 17.7 g/dL 15.1 14.4 13.9  Hematocrit 37.5 - 51.0 % 44.3 42.6 41.4  Platelets 150 - 450 x10E3/uL 213 205.0 160   Lab Results  Component Value Date   MCV 95 11/19/2019   MCV 92.6 04/01/2017   MCV 91.8 09/08/2016   Lab Results  Component Value Date   TSH 3.560 11/19/2019   No results found for: HGBA1C   BNP No results found for: BNP  ProBNP No results found for: PROBNP   Lipid Panel     Component Value Date/Time  CHOL 135 11/19/2019 0853   TRIG 119 11/19/2019 0853   HDL 36 (L) 11/19/2019 0853   CHOLHDL 3.8 11/19/2019 0853   CHOLHDL 5 06/01/2019 0738   VLDL 40.6 (H) 06/01/2019 0738   LDLCALC 77 11/19/2019 0853   LDLDIRECT 103.0 06/01/2019 0738     RADIOLOGY: No results found.   Additional studies/ records that were reviewed today include:  I reviewed the patient's June 2018 hospitalization, his 2 cardiac catheterization reports, discharge summary, and subsequent initial office visit with Almyra Deforest, PAC. I reviewed most recent laboratory from September 2018 as well as his nuclear perfusion study.  Study Highlights     The  left ventricular ejection fraction is normal (55-65%).  Nuclear stress EF: 55%.  The study is normal.  This is a low risk study.  Blood pressure demonstrated a normal response to exercise.  There was no ST segment deviation noted during stress.   Low risk stress nuclear study with normal perfusion and normal left ventricular regional and global systolic function.      ASSESSMENT:    1. Coronary artery disease involving native coronary artery of native heart without angina pectoris   2. History of percutaneous coronary intervention: LAD June 2018   3. Essential hypertension   4. Hyperlipidemia with target LDL less than 70   5. Multiple sclerosis [facility])     PLAN:  John Kelly is a 54 year-old gentleman who presented with unstable angina and may have had transient vasospasm of his ostial LAD contributing to J-point elevations initially in leads V3 2V's 5, which normalized following a short burst of idioventricular rhythm and with treatment of IV heparin and nitroglycerin.  Several days later due to recurrent chest pain symptomatology and he underwent stenting of his LAD ostium.   He was started on atorvastatin 80 mg and has been maintained on aspirin and Plavix without bleeding.  He is a history of hypertension and was on losartan 50 mg in addition to amlodipine 2.5 mg and metoprolol 12.5 mg twice a day.  When I last saw his blood pressure was elevated and further titrated amlodipine.  Apparently he is no longer on amlodipine but is on losartan 50 mg daily, metoprolol 12.5 mg twice a day.    He continues to be on losartan at 50 mg but if his blood pressure is consistently above 135-140 further titration to losartan 75 mg was recommended.  He is not having any anginal symptoms.  He is on Nexium for GERD.  He continues to be on balsalazide for his ulcerative colitis.  He continues to be on atorvastatin 40 mg for hyperlipidemia.  In March 2021 LDL was increased at 103.  He has not had  recent laboratory.  I am rechecking a comprehensive metabolic panel, CBC, TSH and lipid studies.  I will see him in 3 to 4 months for follow-up evaluation or sooner as needed.    Medication Adjustments/Labs and Tests Ordered: Current medicines are reviewed at length with the patient today.  Concerns regarding medicines are outlined above.  Medication changes, Labs and Tests ordered today are listed in the Patient Instructions below. Patient Instructions  Medication Instructions:  INCREASE YOUR METOPROLOL TO 25MG TWICE A DAY, IF AFTER A FEW WEEKS IF YOUR BP IS STILL GREATER THAN 130-140 WE MAY INCREASE YOUR LOSARTAN TO 75MG. (CALL us FIRST)  *If you need a refill on your cardiac medications before your next appointment, please call your pharmacy*   Lab Work: FASTING LABS: CMET  CBC TSH LIPIDS If you have labs (blood work) drawn today and your tests are completely normal, you will receive your results only by:  [place] (if you have MyChart) OR  A paper copy in the mail If you have any lab test that is abnormal or we need to change your treatment, we will call you to review the results.   Follow-Up: At [facility], you and your health needs are our priority.  As part of our continuing mission to provide you with exceptional heart care, we have created designated Provider Care Teams.  These Care Teams include your primary Cardiologist (physician) and Advanced Practice Providers (APPs -  Physician Assistants and Nurse Practitioners) who all work together to provide you with the care you need, when you need it.  We recommend signing up for the patient portal called "MyChart".  Sign up information is provided on this After Visit Summary.  MyChart is used to connect with patients for Virtual Visits (Telemedicine).  Patients are able to view lab/test results, encounter notes, upcoming appointments, etc.  Non-urgent messages can be sent to your provider as well.   To learn more about  what you can do with MyChart, go to [email or web address].    Your next appointment:   3-4 month(s)  The format for your next appointment:   In Person  Provider:   Shelva Majestic, MD      Signed, Shelva Majestic, MD  11/22/2019 4:17 PM    [facility] [address], [place], [place], [place]  [address] Phone: [phone]

## 2019-11-16 NOTE — Patient Instructions (Signed)
Medication Instructions:  INCREASE YOUR METOPROLOL TO 25MG TWICE A DAY, IF AFTER A FEW WEEKS IF YOUR BP IS STILL GREATER THAN 130-140 WE MAY INCREASE YOUR LOSARTAN TO 75MG. (CALL us FIRST)  *If you need a refill on your cardiac medications before your next appointment, please call your pharmacy*   Lab Work: FASTING LABS: CMET CBC TSH LIPIDS If you have labs (blood work) drawn today and your tests are completely normal, you will receive your results only by:  [place] (if you have MyChart) OR  A paper copy in the mail If you have any lab test that is abnormal or we need to change your treatment, we will call you to review the results.   Follow-Up: At [facility], you and your health needs are our priority.  As part of our continuing mission to provide you with exceptional heart care, we have created designated Provider Care Teams.  These Care Teams include your primary Cardiologist (physician) and Advanced Practice Providers (APPs -  Physician Assistants and Nurse Practitioners) who all work together to provide you with the care you need, when you need it.  We recommend signing up for the patient portal called "MyChart".  Sign up information is provided on this After Visit Summary.  MyChart is used to connect with patients for Virtual Visits (Telemedicine).  Patients are able to view lab/test results, encounter notes, upcoming appointments, etc.  Non-urgent messages can be sent to your provider as well.   To learn more about what you can do with MyChart, go to [email or web address].    Your next appointment:   3-4 month(s)  The format for your next appointment:   In Person  Provider:   Shelva Majestic, MD

## 2019-11-19 LAB — CBC
Hematocrit: 44.3 % (ref 37.5–51.0)
Hemoglobin: 15.1 g/dL (ref 13.0–17.7)
MCH: 32.4 pg (ref 26.6–33.0)
MCHC: 34.1 g/dL (ref 31.5–35.7)
MCV: 95 fL (ref 79–97)
Platelets: 213 10*3/uL (ref 150–450)
RBC: 4.66 x10E6/uL (ref 4.14–5.80)
RDW: 12 % (ref 11.6–15.4)
WBC: 7 10*3/uL (ref 3.4–10.8)

## 2019-11-19 LAB — LIPID PANEL
Chol/HDL Ratio: 3.8 ratio (ref 0.0–5.0)
Cholesterol, Total: 135 mg/dL (ref 100–199)
HDL: 36 mg/dL — ABNORMAL LOW (ref >39)
LDL Chol Calc (NIH): 77 mg/dL (ref 0–99)
Triglycerides: 119 mg/dL (ref 0–149)
VLDL Cholesterol Cal: 22 mg/dL (ref 5–40)

## 2019-11-19 LAB — COMPREHENSIVE METABOLIC PANEL
ALT: 29 IU/L (ref 0–44)
AST: 27 IU/L (ref 0–40)
Albumin/Globulin Ratio: 1.9 (ref 1.2–2.2)
Albumin: 4.2 g/dL (ref 3.8–4.9)
Alkaline Phosphatase: 67 IU/L (ref 48–121)
BUN/Creatinine Ratio: 11 (ref 9–20)
BUN: 15 mg/dL (ref 6–24)
Bilirubin Total: 1 mg/dL (ref 0.0–1.2)
CO2: 25 mmol/L (ref 20–29)
Calcium: 9 mg/dL (ref 8.7–10.2)
Chloride: 103 mmol/L (ref 96–106)
Creatinine, Ser: 1.31 mg/dL — ABNORMAL HIGH (ref 0.76–1.27)
GFR calc Af Amer: 70 mL/min/{1.73_m2} (ref >59)
GFR calc non Af Amer: 61 mL/min/{1.73_m2} (ref >59)
Globulin, Total: 2.2 g/dL (ref 1.5–4.5)
Glucose: 90 mg/dL (ref 65–99)
Potassium: 4.9 mmol/L (ref 3.5–5.2)
Sodium: 139 mmol/L (ref 134–144)
Total Protein: 6.4 g/dL (ref 6.0–8.5)

## 2019-11-19 LAB — TSH: TSH: 3.56 u[IU]/mL (ref 0.450–4.500)

## 2019-11-22 ENCOUNTER — Encounter: Payer: Self-pay | Admitting: Cardiovascular Disease

## 2020-01-15 ENCOUNTER — Other Ambulatory Visit: Payer: Self-pay | Admitting: Nurse Practitioner

## 2020-01-15 DIAGNOSIS — G8929 Other chronic pain: Secondary | ICD-10-CM

## 2020-01-15 DIAGNOSIS — M5416 Radiculopathy, lumbar region: Secondary | ICD-10-CM

## 2020-01-15 DIAGNOSIS — M5441 Lumbago with sciatica, right side: Secondary | ICD-10-CM

## 2020-01-25 ENCOUNTER — Other Ambulatory Visit: Payer: Self-pay

## 2020-01-25 ENCOUNTER — Ambulatory Visit
Admission: RE | Admit: 2020-01-25 | Discharge: 2020-01-25 | Disposition: A | Discharge status: Home / Self Care | Payer: BC Managed Care – PPO | Source: Ambulatory Visit | Attending: Nurse Practitioner | Admitting: Nurse Practitioner

## 2020-01-25 DIAGNOSIS — M5416 Radiculopathy, lumbar region: Secondary | ICD-10-CM

## 2020-01-25 MED ORDER — IOPAMIDOL (ISOVUE-M 200) INJECTION 41%
1.0000 mL | Freq: Once | INTRAMUSCULAR | Status: AC
  Administered 2020-01-25: 1 mL via EPIDURAL

## 2020-01-25 MED ORDER — METHYLPREDNISOLONE ACETATE 40 MG/ML INJ SUSP (RADIOLOG
120.0000 mg | Freq: Once | INTRAMUSCULAR | Status: AC
  Administered 2020-01-25: 120 mg via EPIDURAL

## 2020-01-25 NOTE — Discharge Instructions (Signed)

## 2020-02-08 ENCOUNTER — Other Ambulatory Visit: Payer: Self-pay | Admitting: Gastroenterology

## 2020-02-25 ENCOUNTER — Encounter: Payer: Self-pay | Admitting: Family Medicine

## 2020-02-25 ENCOUNTER — Other Ambulatory Visit: Payer: Self-pay

## 2020-02-25 ENCOUNTER — Ambulatory Visit: Payer: BC Managed Care – PPO | Admitting: Family Medicine

## 2020-02-25 VITALS — BP 140/84 | HR 71 | Temp 98.6°F | Ht 66.75 in | Wt 181.8 lb

## 2020-02-25 DIAGNOSIS — M79605 Pain in left leg: Secondary | ICD-10-CM

## 2020-02-25 NOTE — Patient Instructions (Signed)
Ice, gentle stretching, tylenol for pain. Call if not improving.

## 2020-02-25 NOTE — Progress Notes (Signed)
Chief Complaint  Patient presents with  . Leg Pain    Back on left leg x 1 1/2 weeks    History of Present Illness: HPI    54 year old male with 1.5 week of left posterior knee pain, lower thigh.  Initially are was red and warm..  No change in activity, no fall, no known injury. He works out 3-4 days a week.  Pain is constant but worse with bending knee.  No knee pain or swelling. 5/10 on pain.   Has not taken anything for symptoms.   no calf pain, no calf swelling.  No history of varicose veins.  no recent medication changes.     This visit occurred during the SARS-CoV-2 public health emergency.  Safety protocols were in place, including screening questions prior to the visit, additional usage of staff PPE, and extensive cleaning of exam room while observing appropriate contact time as indicated for disinfecting solutions.   COVID 19 screen:  No recent travel or known exposure to COVID19 The patient denies respiratory symptoms of COVID 19 at this time. The importance of social distancing was discussed today.    Review of Systems  Constitutional: Negative for chills and fever.  HENT: Negative for congestion and ear pain.   Eyes: Negative for pain and redness.  Respiratory: Negative for cough and shortness of breath.   Cardiovascular: Negative for chest pain, palpitations and leg swelling.  Gastrointestinal: Negative for abdominal pain, blood in stool, constipation, diarrhea, nausea and vomiting.  Genitourinary: Negative for dysuria.  Musculoskeletal: Negative for falls and myalgias.  Skin: Negative for rash.  Neurological: Negative for dizziness.  Psychiatric/Behavioral: Negative for depression. The patient is not nervous/anxious.       Past Medical History:  Diagnosis Date  . Abnormal liver function test   . Allergic rhinitis   . Allergy   . Anxiety   . CAD (coronary artery disease) 09/01/2016   a.  09/01/16; NSTEMI LHC: 60-70% ostial LAD stenosis, 20% mid  LAD, 30% RCA. EF 55-65%  b. 09/07/16 [place] s/p DES to oLAD  . DDD (degenerative disc disease)   . Depression   . GERD (gastroesophageal reflux disease)   . Hyperlipidemia   . Hypertension    no meds  . Insomnia   . MS (multiple sclerosis) ([place])   . Myocardial infarction ([place])    2018  . Neuromuscular disorder ([place])    MS  . Ulcerative colitis    left sided    reports that he has never smoked. He has never used smokeless tobacco. He reports current alcohol use. He reports that he does not use drugs.   Observations/Objective: Blood pressure 140/84, pulse 71, temperature 98.6 F (37 C), temperature source Temporal, height 5' 6.75" (1.695 m), weight 181 lb 12 oz (82.4 kg), SpO2 96 %.  Physical Exam Constitutional:      General: Vital signs are normal.     Appearance: He is well-developed and well-nourished.  HENT:     Head: Normocephalic.     Right Ear: Hearing normal.     Left Ear: Hearing normal.     Nose: Nose normal.     Mouth/Throat:     Mouth: Oropharynx is clear and moist and mucous membranes are normal.  Neck:     Thyroid: No thyroid mass or thyromegaly.     Vascular: No carotid bruit.     Trachea: Trachea normal.  Cardiovascular:     Rate and Rhythm: Normal rate and regular rhythm.  Pulses: Normal pulses.     Heart sounds: Heart sounds not distant. No murmur heard. No friction rub. No gallop.      Comments: No peripheral edema Pulmonary:     Effort: Pulmonary effort is normal. No respiratory distress.     Breath sounds: Normal breath sounds.  Skin:    General: Skin is warm, dry and intact.     Findings: No rash.  Psychiatric:        Mood and Affect: Mood and affect normal.        Speech: Speech normal.        Behavior: Behavior normal.        Thought Content: Thought content normal.      Assessment and Plan     Eliezer Lofts, MD

## 2020-03-15 ENCOUNTER — Encounter: Payer: Self-pay | Admitting: Physician Assistant

## 2020-03-15 ENCOUNTER — Telehealth: Payer: Self-pay | Admitting: Gastroenterology

## 2020-03-15 ENCOUNTER — Other Ambulatory Visit: Payer: Self-pay

## 2020-03-15 ENCOUNTER — Ambulatory Visit (INDEPENDENT_AMBULATORY_CARE_PROVIDER_SITE_OTHER): Payer: BC Managed Care – PPO | Admitting: Physician Assistant

## 2020-03-15 VITALS — BP 134/84 | HR 69 | Ht 67.0 in | Wt 181.0 lb

## 2020-03-15 DIAGNOSIS — I25119 Atherosclerotic heart disease of native coronary artery with unspecified angina pectoris: Secondary | ICD-10-CM | POA: Diagnosis not present

## 2020-03-15 DIAGNOSIS — Z79899 Other long term (current) drug therapy: Secondary | ICD-10-CM | POA: Diagnosis not present

## 2020-03-15 DIAGNOSIS — E785 Hyperlipidemia, unspecified: Secondary | ICD-10-CM

## 2020-03-15 DIAGNOSIS — I10 Essential (primary) hypertension: Secondary | ICD-10-CM

## 2020-03-15 DIAGNOSIS — R079 Chest pain, unspecified: Secondary | ICD-10-CM

## 2020-03-15 DIAGNOSIS — G35 Multiple sclerosis: Secondary | ICD-10-CM

## 2020-03-15 NOTE — Telephone Encounter (Signed)
Inbound call from patient requesting a call back from the nurse.  Patient is currently having rectal bleeding and is wanting to schedule appointment.  Informed patient next availability with MD or APP is on 04/06/20, but is insisting on being seen sooner.

## 2020-03-15 NOTE — Telephone Encounter (Signed)
Patient reports a few week history of rectal bleeding.  He has a hx of UC.  He has been using mesalamine suppositories with no improvement.  He reports bright red bleeding with each BM.  He will come in on 03/21/20 9:00 with [name] PA

## 2020-03-15 NOTE — Patient Instructions (Signed)
Medication Instructions:   Once you get home check Losartan bottle. If you are currently taking 50 mg daily INCREASE to 75 mg daily. If you are currently taking 75 mg daily INCREASE to 100 mg daily *If you need a refill on your cardiac medications before your next appointment, please call your pharmacy*  Lab Work: Your physician recommends that you return for lab work in 1-2 months:   BMET  If you have labs (blood work) drawn today and your tests are completely normal, you will receive your results only by:  [employer] (if you have MyChart) OR  A paper copy in the mail If you have any lab test that is abnormal or we need to change your treatment, we will call you to review the results.  Testing/Procedures: NONE ordered at this time of appointment   Follow-Up: At [facility], you and your health needs are our priority.  As part of our continuing mission to provide you with exceptional heart care, we have created designated Provider Care Teams.  These Care Teams include your primary Cardiologist (physician) and Advanced Practice Providers (APPs -  Physician Assistants and Nurse Practitioners) who all work together to provide you with the care you need, when you need it.  Your next appointment:   6 month(s)  The format for your next appointment:   In Person  Provider:   Shelva Majestic, MD  Other Instructions

## 2020-03-15 NOTE — Progress Notes (Signed)
Cardiology Office Note:    Date:  03/16/2020   ID:  John Kelly, DOB [birth year], MRN [id number]  PCP:  John Sanders, MD  [facility] Cardiologist:  John Majestic, MD  [place] Electrophysiologist:  None   Referring MD: John Sanders, MD   Chief Complaint  Patient presents with  . Follow-up    Seen for Dr. Claiborne Kelly    History of Present Illness:    John Kelly is a 54 y.o. male with a hx of hypertension, multiple sclerosis, hyperlipidemia, GERD, and history of CAD.  Patient had a NSTEMI on 2018, cardiac catheterization revealed a 60 to 70% ostial LAD, 20% mid LAD lesion, 30% RCA lesion.  Medical therapy was recommended.  Unfortunately after discharge, he developed recurrent chest pain and return to the Cath Lab on 09/07/2016 and underwent 4.0 x 18 mm resolute DES to LAD.  There was slight plaque shift into the ostium of the left circumflex, however he continued to have quite excellent flow down both vessels.  Myoview obtained on 12/28/2016 showed EF 55%, normal perfusion.  He was last seen by Dr. Claiborne Kelly in August 2021, blood pressure elevated, metoprolol was increased to 25 mg twice daily.   Patient presents today for cardiology follow-up.  Blood pressure remain in the 130s to 140s range.  He is not sure if he has increased the losartan yet.  I asked him to check his medication bottles at home, if he is taking 50 mg of losartan I would recommend to increase it to 75 mg daily, however if he is already on 75 mg daily dosing, he should increase it further to 100 mg daily.  He will need a basic metabolic panel in 1 to 2 months to follow-up on the renal function and electrolyte.  He did mention he had a episode of prolonged chest pain about a month ago that lasted about 20 minutes.  He took a single nitroglycerin and the symptom eventually went away.  He did not seek medical attention.  The chest pain has never came back since.  I recommended continue observation.  EKG obtained today showed T  wave inversion in lead in lead III however not in adjacent lead, overall no significant ischemic change.  Past Medical History:  Diagnosis Date  . Abnormal liver function test   . Allergic rhinitis   . Allergy   . Anxiety   . CAD (coronary artery disease) 09/01/2016   a.  09/01/16; NSTEMI LHC: 60-70% ostial LAD stenosis, 20% mid LAD, 30% RCA. EF 55-65%  b. 09/07/16 [place] s/p DES to oLAD  . DDD (degenerative disc disease)   . Depression   . GERD (gastroesophageal reflux disease)   . Hyperlipidemia   . Hypertension    no meds  . Insomnia   . MS (multiple sclerosis) ([place])   . Myocardial infarction ([place])    2018  . Neuromuscular disorder ([place])    MS  . Ulcerative colitis    left sided    Past Surgical History:  Procedure Laterality Date  . COLONOSCOPY    . CORONARY ANGIOGRAPHY N/A 09/07/2016   Procedure: Coronary Angiography;  Surgeon: Burnell Blanks, MD;  Location: [place] [facility];  Service: Cardiovascular;  Laterality: N/A;  . CORONARY STENT INTERVENTION N/A 09/07/2016   Procedure: Coronary Stent Intervention;  Surgeon: Burnell Blanks, MD;  Location: [place] [facility];  Service: Cardiovascular;  Laterality: N/A;  . Inguinal Herniorrhapy     Right and left  .  LEFT HEART CATH AND CORONARY ANGIOGRAPHY N/A 09/01/2016   Procedure: Left Heart Cath and Coronary Angiography;  Surgeon: Troy Sine, MD;  Location: [place] [facility];  Service: Cardiovascular;  Laterality: N/A;  . LEFT HEART CATH AND CORONARY ANGIOGRAPHY N/A 09/07/2016   Procedure: Left Heart Cath and Coronary Angiography;  Surgeon: Burnell Blanks, MD;  Location: [place] [facility];  Service: Cardiovascular;  Laterality: N/A;  . PYLOROPLASTY      Current Medications: Current Meds  Medication Sig  . acetaminophen (TYLENOL) 500 MG tablet Take 1,000 mg by mouth daily as needed for moderate pain or headache.  . Ascorbic Acid (VITAMIN C) 1000 MG tablet Take 1,000 mg by mouth daily.  Marland Kitchen aspirin  81 MG EC tablet Take 1 tablet (81 mg total) by mouth daily.  Marland Kitchen atorvastatin (LIPITOR) 40 MG tablet Take 1 tablet (40 mg total) by mouth daily.  . balsalazide (COLAZAL) 750 MG capsule Take 3 capsules (2,250 mg total) by mouth 3 (three) times daily.  . Cholecalciferol (VITAMIN D3) 5000 units CAPS Take 5,000 Units by mouth daily.  . cyclobenzaprine (FLEXERIL) 5 MG tablet Take by mouth.  . diphenhydrAMINE (BENADRYL) 25 MG tablet Take 50 mg by mouth daily as needed for allergies.  . DiphenhydrAMINE HCl, Sleep, (ZZZQUIL PO) Take 1 Dose by mouth at bedtime as needed (sleep).  Marland Kitchen esomeprazole (NEXIUM) 20 MG packet Take 20 mg by mouth daily.  Marland Kitchen losartan (COZAAR) 50 MG tablet Take 1 tablet (50 mg total) by mouth daily.  . mesalamine (ROWASA) 4 g enema INSERT 1 ENEMA RECTALLY AT BEDTIME FOR 14 DAYS  . metoprolol tartrate (LOPRESSOR) 25 MG tablet Take 1 tablet (25 mg total) by mouth 2 (two) times daily.  . Misc Natural Products (OSTEO BI-FLEX TRIPLE STRENGTH PO) Take 2 tablets by mouth daily.  . nitroGLYCERIN (NITROSTAT) 0.4 MG SL tablet Place 1 tablet (0.4 mg total) under the tongue every 5 (five) minutes x 3 doses as needed for chest pain.  . traZODone (DESYREL) 50 MG tablet Take 0.5-1 tablets (25-50 mg total) by mouth at bedtime as needed for sleep.  Marland Kitchen zinc gluconate 50 MG tablet Take 50 mg by mouth daily.     Allergies:   Diclofenac sodium, Skelaxin [metaxalone], and Trazodone and nefazodone   Social History   Socioeconomic History  . Marital status: Divorced    Spouse name: Not on file  . Number of children: 2  . Years of education: Not on file  . Highest education level: Not on file  Occupational History  . Occupation: Physicist, medical: [employer]  Tobacco Use  . Smoking status: Never Smoker  . Smokeless tobacco: Never Used  Vaping Use  . Vaping Use: Never used  Substance and Sexual Activity  . Alcohol use: Yes    Alcohol/week: 0.0 standard drinks    Comment:  rarely  . Drug use: No  . Sexual activity: Not Currently  Other Topics Concern  . Not on file  Social History Narrative   HSG   Divorced, not sexually active   1 son- '92, 1 daughter - '97   Work: Lobbyist- shipping/ rec'ing; quality management   Patient has never smoked   Alcohol use- rare 1-2 a month   Illicit Drug use- no    Patient gets regular exercise.   Social Determinants of Health   Financial Resource Strain: Not on file  Food Insecurity: Not on file  Transportation Needs: Not on file  Physical Activity: Not on file  Stress: Not on file  Social Connections: Not on file     Family History: The patient's family history includes Colitis in his mother; Coronary artery disease in his brother, father, and mother; Diabetes in his brother and father; Heart attack in his father and mother; Heart attack (age of onset: 5) in his brother; Hyperlipidemia in his father and mother; Hypertension in his brother, father, mother, and sister; Transient ischemic attack in his sister. There is no history of Colon cancer, Esophageal cancer, Stomach cancer, or Rectal cancer.  ROS:   Please see the history of present illness.     All other systems reviewed and are negative.  EKGs/Labs/Other Studies Reviewed:    The following studies were reviewed today:  Cath 09/07/2016  Prox RCA lesion, 20 %stenosed.  Ost Cx to Prox Cx lesion, 10 %stenosed.  A STENT RESOLUTE ONYX 4.0X8 drug eluting stent was successfully placed.  LM lesion, 80 %stenosed.  Post intervention, there is a 0% residual stenosis.   1. Unstable angina 2. Severe stenosis ostial LAD 3. Successful PTCA/DES x 1 ostial LAD  Recommendations: Will continue DAPT with ASA and Plavix for at least one year. Continue statin and beta blocker.    EKG:  EKG is ordered today.  The ekg ordered today demonstrates normal sinus rhythm, new T wave inversion in lead III however not in the adjacent lead.  Overall no significant  ischemic change.  Recent Labs: 11/19/2019: ALT 29; BUN 15; Creatinine, Ser 1.31; Hemoglobin 15.1; Platelets 213; Potassium 4.9; Sodium 139; TSH 3.560  Recent Lipid Panel    Component Value Date/Time   CHOL 135 11/19/2019 0853   TRIG 119 11/19/2019 0853   HDL 36 (L) 11/19/2019 0853   CHOLHDL 3.8 11/19/2019 0853   CHOLHDL 5 06/01/2019 0738   VLDL 40.6 (H) 06/01/2019 0738   LDLCALC 77 11/19/2019 0853   LDLDIRECT 103.0 06/01/2019 0738     Risk Assessment/Calculations:       Physical Exam:    VS:  BP 134/84   Pulse 69   Ht 5' 7"  (1.702 m)   Wt 181 lb (82.1 kg)   SpO2 95%   BMI 28.35 kg/m     Wt Readings from Last 3 Encounters:  03/15/20 181 lb (82.1 kg)  02/25/20 181 lb 12 oz (82.4 kg)  11/16/19 178 lb (80.7 kg)     GEN:  Well nourished, well developed in no acute distress HEENT: Normal NECK: No JVD; No carotid bruits LYMPHATICS: No lymphadenopathy CARDIAC: RRR, no murmurs, rubs, gallops RESPIRATORY:  Clear to auscultation without rales, wheezing or rhonchi  ABDOMEN: Soft, non-tender, non-distended MUSCULOSKELETAL:  No edema; No deformity  SKIN: Warm and dry NEUROLOGIC:  Alert and oriented x 3 PSYCHIATRIC:  Normal affect   ASSESSMENT:    1. Chest pain, unspecified type   2. Medication management   3. Coronary artery disease involving native coronary artery of native heart with angina pectoris ([place])   4. Essential hypertension   5. Hyperlipidemia with target LDL less than 70   6. Multiple sclerosis ([place])    PLAN:    In order of problems listed above:  1. Chest pain: Since episode of chest pain lasted 20 minutes occurred about a month ago.  He did not seek medical attention at the time.  Symptom resolved after 1 nitroglycerin.  EKG today shows no significant change.  Since he has not had any further chest discomfort despite physical activity in the past month,  I recommended continue observation  2. CAD: Last PCI 2018.  3. Hypertension: Blood pressure  elevated, I recommend increase losartan further.  Unfortunately he is does not know which dose of losartan he is on at home.  If he is still on losartan 50 mg daily, I would recommend increase it to 75 mg daily.  However if he has already increased the losartan to 75 mg daily prior to today's visit, then I will increase the losartan to 100 mg daily.  He will need basic metabolic panel in 1 to 2 months.  4. Hyperlipidemia: On Lipitor  5. Multiple sclerosis: Followed by Rob Hickman.      Medication Adjustments/Labs and Tests Ordered: Current medicines are reviewed at length with the patient today.  Concerns regarding medicines are outlined above.  Orders Placed This Encounter  Procedures  . Basic metabolic panel  . EKG 12-Lead   No orders of the defined types were placed in this encounter.   Patient Instructions  Medication Instructions:   Once you get home check Losartan bottle. If you are currently taking 50 mg daily INCREASE to 75 mg daily. If you are currently taking 75 mg daily INCREASE to 100 mg daily *If you need a refill on your cardiac medications before your next appointment, please call your pharmacy*  Lab Work: Your physician recommends that you return for lab work in 1-2 months:   BMET  If you have labs (blood work) drawn today and your tests are completely normal, you will receive your results only by: [employer] MyChart Message (if you have MyChart) OR . A paper copy in the mail If you have any lab test that is abnormal or we need to change your treatment, we will call you to review the results.  Testing/Procedures: NONE ordered at this time of appointment   Follow-Up: At [facility], you and your health needs are our priority.  As part of our continuing mission to provide you with exceptional heart care, we have created designated Provider Care Teams.  These Care Teams include your primary Cardiologist (physician) and Advanced Practice Providers (APPs -  Physician Assistants  and Nurse Practitioners) who all work together to provide you with the care you need, when you need it.  Your next appointment:   6 month(s)  The format for your next appointment:   In Person  Provider:   Shelva Majestic, MD  Other Instructions      Signed, John Kelly, [place]  03/16/2020 11:01 PM    [place]

## 2020-03-16 ENCOUNTER — Encounter: Payer: Self-pay | Admitting: Physician Assistant

## 2020-03-17 ENCOUNTER — Encounter: Payer: Self-pay | Admitting: Family Medicine

## 2020-03-17 ENCOUNTER — Ambulatory Visit: Payer: BC Managed Care – PPO | Admitting: Family Medicine

## 2020-03-17 ENCOUNTER — Other Ambulatory Visit: Payer: BC Managed Care – PPO

## 2020-03-17 ENCOUNTER — Other Ambulatory Visit: Payer: Self-pay

## 2020-03-17 VITALS — BP 130/82 | HR 73 | Temp 98.8°F | Ht 67.0 in | Wt 181.5 lb

## 2020-03-17 DIAGNOSIS — Z202 Contact with and (suspected) exposure to infections with a predominantly sexual mode of transmission: Secondary | ICD-10-CM | POA: Diagnosis not present

## 2020-03-17 DIAGNOSIS — R300 Dysuria: Secondary | ICD-10-CM | POA: Diagnosis not present

## 2020-03-17 LAB — URINALYSIS, ROUTINE W REFLEX MICROSCOPIC
Bilirubin Urine: NEGATIVE
Hgb urine dipstick: NEGATIVE
Ketones, ur: NEGATIVE
Leukocytes,Ua: NEGATIVE
Nitrite: NEGATIVE
RBC / HPF: NONE SEEN (ref 0–?)
Specific Gravity, Urine: >=1.03 — AB (ref 1.000–1.030)
Total Protein, Urine: NEGATIVE
Urine Glucose: NEGATIVE
Urobilinogen, UA: 0.2 (ref 0.0–1.0)
pH: 6 (ref 5.0–8.0)

## 2020-03-17 NOTE — Progress Notes (Signed)
Cerra Eisenhower T. Iness Pangilinan, MD, [place] at [facility] [place], [address]  Phone: [phone]  FAX: [phone]  John Kelly - 54 y.o. male  MRN [id number]  Date of Birth: Oct 11, 1965  Date: 03/17/2020  PCP: Jinny Sanders, MD  Referral: Jinny Sanders, MD  Chief Complaint  Patient presents with  . Urinary Frequency    X 2 weeks    This visit occurred during the SARS-CoV-2 public health emergency.  Safety protocols were in place, including screening questions prior to the visit, additional usage of staff PPE, and extensive cleaning of exam room while observing appropriate contact time as indicated for disinfecting solutions.   Subjective:   John Kelly is a 54 y.o. very pleasant male patient with Body mass index is 28.43 kg/m. who presents with the following:  Feels like he needs to pee.  No fever or chills.  4 partners in the last few months and without protection. He does not have any ulceration, painful sores or any penile discharge. He does not have any pain in the perineal region, pain with ejaculation, or pain in his testicles.  Burning with urination x 2 weeks Urine and std check  Review of Systems is noted in the HPI, as appropriate  Objective:   BP 130/82   Pulse 73   Temp 98.8 F (37.1 C) (Temporal)   Ht 5' 7"  (1.702 m)   Wt 181 lb 8 oz (82.3 kg)   SpO2 95%   BMI 28.43 kg/m   GEN: No acute distress; alert,appropriate. PULM: Breathing comfortably in no respiratory distress PSYCH: Normally interactive.  Testicular exam is within normal limits and there is no tenderness throughout.  No ulcerations or topical changes to his penis.  Laboratory and Imaging Data:  Assessment and Plan:     ICD-10-CM   1. Dysuria  R30.0 C. trachomatis/N. gonorrhoeae RNA    HIV Antibody (routine testing w rflx)    RPR    Trichomonas vaginalis, RNA    Hepatitis B core  antibody, IgM    Hepatitis B surface antibody,qualitative    Hepatitis B surface antigen    Hepatitis C antibody    Urinalysis, Routine w reflex microscopic    Urine Culture    CANCELED: Urine Culture  2. Possible exposure to STD  Z20.2 C. trachomatis/N. gonorrhoeae RNA    HIV Antibody (routine testing w rflx)    RPR    Trichomonas vaginalis, RNA    Hepatitis B core antibody, IgM    Hepatitis B surface antibody,qualitative    Hepatitis B surface antigen    Hepatitis C antibody   Large-scale STD check.  No orders of the defined types were placed in this encounter.  There are no discontinued medications. Orders Placed This Encounter  Procedures  . C. trachomatis/N. gonorrhoeae RNA  . Trichomonas vaginalis, RNA  . Urine Culture  . HIV Antibody (routine testing w rflx)  . RPR  . Hepatitis B core antibody, IgM  . Hepatitis B surface antibody,qualitative  . Hepatitis B surface antigen  . Hepatitis C antibody  . Urinalysis, Routine w reflex microscopic    Follow-up: No follow-ups on file.  Signed,  Maud Deed. Kymberlyn Eckford, MD   Outpatient Encounter Medications as of 03/17/2020  Medication Sig  . acetaminophen (TYLENOL) 500 MG tablet Take 1,000 mg by mouth daily as needed for moderate pain or headache.  . Ascorbic Acid (VITAMIN C) 1000  MG tablet Take 1,000 mg by mouth daily.  Marland Kitchen aspirin 81 MG EC tablet Take 1 tablet (81 mg total) by mouth daily.  Marland Kitchen atorvastatin (LIPITOR) 40 MG tablet Take 1 tablet (40 mg total) by mouth daily.  . balsalazide (COLAZAL) 750 MG capsule Take 3 capsules (2,250 mg total) by mouth 3 (three) times daily.  . Cholecalciferol (VITAMIN D3) 5000 units CAPS Take 5,000 Units by mouth daily.  . cyclobenzaprine (FLEXERIL) 5 MG tablet Take by mouth.  . diphenhydrAMINE (BENADRYL) 25 MG tablet Take 50 mg by mouth daily as needed for allergies.  . DiphenhydrAMINE HCl, Sleep, (ZZZQUIL PO) Take 1 Dose by mouth at bedtime as needed (sleep).  Marland Kitchen esomeprazole (NEXIUM) 20  MG packet Take 20 mg by mouth daily.  Marland Kitchen losartan (COZAAR) 50 MG tablet Take 1 tablet (50 mg total) by mouth daily.  . mesalamine (ROWASA) 4 g enema INSERT 1 ENEMA RECTALLY AT BEDTIME FOR 14 DAYS  . metoprolol tartrate (LOPRESSOR) 25 MG tablet Take 1 tablet (25 mg total) by mouth 2 (two) times daily.  . Misc Natural Products (OSTEO BI-FLEX TRIPLE STRENGTH PO) Take 2 tablets by mouth daily.  . nitroGLYCERIN (NITROSTAT) 0.4 MG SL tablet Place 1 tablet (0.4 mg total) under the tongue every 5 (five) minutes x 3 doses as needed for chest pain.  . traZODone (DESYREL) 50 MG tablet Take 0.5-1 tablets (25-50 mg total) by mouth at bedtime as needed for sleep.  Marland Kitchen zinc gluconate 50 MG tablet Take 50 mg by mouth daily.   No facility-administered encounter medications on file as of 03/17/2020.

## 2020-03-18 LAB — URINE CULTURE
MICRO NUMBER:: 11370506
Result:: NO GROWTH
SPECIMEN QUALITY:: ADEQUATE

## 2020-03-19 ENCOUNTER — Encounter: Payer: Self-pay | Admitting: Family Medicine

## 2020-03-21 ENCOUNTER — Other Ambulatory Visit (INDEPENDENT_AMBULATORY_CARE_PROVIDER_SITE_OTHER): Payer: BC Managed Care – PPO

## 2020-03-21 ENCOUNTER — Encounter: Payer: Self-pay | Admitting: Physician Assistant

## 2020-03-21 ENCOUNTER — Ambulatory Visit: Payer: BC Managed Care – PPO | Admitting: Physician Assistant

## 2020-03-21 VITALS — BP 140/90 | HR 68 | Ht 67.0 in | Wt 180.0 lb

## 2020-03-21 DIAGNOSIS — K6289 Other specified diseases of anus and rectum: Secondary | ICD-10-CM | POA: Diagnosis not present

## 2020-03-21 DIAGNOSIS — K625 Hemorrhage of anus and rectum: Secondary | ICD-10-CM | POA: Diagnosis not present

## 2020-03-21 DIAGNOSIS — K612 Anorectal abscess: Secondary | ICD-10-CM

## 2020-03-21 DIAGNOSIS — K51319 Ulcerative (chronic) rectosigmoiditis with unspecified complications: Secondary | ICD-10-CM

## 2020-03-21 LAB — CBC WITH DIFFERENTIAL/PLATELET
Basophils Absolute: 0 10*3/uL (ref 0.0–0.1)
Basophils Relative: 0.5 % (ref 0.0–3.0)
Eosinophils Absolute: 0.2 10*3/uL (ref 0.0–0.7)
Eosinophils Relative: 2.4 % (ref 0.0–5.0)
HCT: 44.1 % (ref 39.0–52.0)
Hemoglobin: 15.3 g/dL (ref 13.0–17.0)
Lymphocytes Relative: 28 % (ref 12.0–46.0)
Lymphs Abs: 2.5 10*3/uL (ref 0.7–4.0)
MCHC: 34.7 g/dL (ref 30.0–36.0)
MCV: 91 fl (ref 78.0–100.0)
Monocytes Absolute: 0.9 10*3/uL (ref 0.1–1.0)
Monocytes Relative: 9.8 % (ref 3.0–12.0)
Neutro Abs: 5.4 10*3/uL (ref 1.4–7.7)
Neutrophils Relative %: 59.3 % (ref 43.0–77.0)
Platelets: 239 10*3/uL (ref 150.0–400.0)
RBC: 4.85 Mil/uL (ref 4.22–5.81)
RDW: 12.8 % (ref 11.5–15.5)
WBC: 9 10*3/uL (ref 4.0–10.5)

## 2020-03-21 LAB — BASIC METABOLIC PANEL
BUN: 11 mg/dL (ref 6–23)
CO2: 30 mEq/L (ref 19–32)
Calcium: 9.3 mg/dL (ref 8.4–10.5)
Chloride: 102 mEq/L (ref 96–112)
Creatinine, Ser: 1.15 mg/dL (ref 0.40–1.50)
GFR: 71.72 mL/min (ref >60.00)
Glucose, Bld: 99 mg/dL (ref 70–99)
Potassium: 3.9 mEq/L (ref 3.5–5.1)
Sodium: 138 mEq/L (ref 135–145)

## 2020-03-21 LAB — C-REACTIVE PROTEIN: CRP: <1 mg/dL (ref 0.5–20.0)

## 2020-03-21 LAB — SEDIMENTATION RATE: Sed Rate: 14 mm/hr (ref 0–20)

## 2020-03-21 MED ORDER — CIPROFLOXACIN HCL 500 MG PO TABS: 500.0000 mg | ORAL_TABLET | Freq: Two times a day (BID) | ORAL | 0 refills | Status: AC

## 2020-03-21 MED ORDER — METRONIDAZOLE 500 MG PO TABS: 500.0000 mg | ORAL_TABLET | Freq: Two times a day (BID) | ORAL | 0 refills | Status: AC

## 2020-03-21 NOTE — Progress Notes (Signed)
Subjective:    Patient ID: John Kelly, male    DOB: [birth year], 55 y.o.   MRN: [id number]  HPI John Kelly is a pleasant 55 year old white male, known to Dr. Fuller Plan with history of ulcerative proctosigmoiditis.  He also has history of hypertension, coronary artery disease, GERD and insomnia. He comes in today after acute onset on 03/11/2020 with diarrhea and some abdominal cramping which occurred after he had eaten out at a [employer].  He says his daughter also had similar symptoms.  That following day he noticed blood in the commode and in the water which was a larger amount than he has seen in the past with his colitis symptoms.  Around that same time he developed internal rectal discomfort which has persisted.  He says it is to the point now that it is hurting and uncomfortable to sit.  He has not developed any fever or chills.  His bowel movements returned to normal after the 1 day of diarrhea and have remained normal.  He has increased pain with defecation.  He continues to see bright red blood with each bowel movement, he has not had any oozing of blood and has not noted any drainage.  He did try to use a mesalamine suppository. He is generally maintained on Colazal 750 mg 3 p.o. 3 times daily, and has used Rowasa enemas in the past. Last Colonoscopy was done 07/16/2019 with finding of 5 rectal polyps all 6 to 8 mm in size, there was inflammation noted from the rectum to the sigmoid with the remainder of the colon spared.  Changes were felt to be mild.  Biopsy showed chronic mildly active colitis and the tissue from the rectal polyp showed benign rectal mucosa with lymphoid aggregate.  Review of Systems Pertinent positive and negative review of systems were noted in the above HPI section.  All other review of systems was otherwise negative.  Outpatient Encounter Medications as of 03/21/2020  Medication Sig  . acetaminophen (TYLENOL) 500 MG tablet Take 1,000 mg by mouth daily as needed for  moderate pain or headache.  . Ascorbic Acid (VITAMIN C) 1000 MG tablet Take 1,000 mg by mouth daily.  Marland Kitchen aspirin 81 MG EC tablet Take 1 tablet (81 mg total) by mouth daily.  Marland Kitchen atorvastatin (LIPITOR) 40 MG tablet Take 1 tablet (40 mg total) by mouth daily.  . balsalazide (COLAZAL) 750 MG capsule Take 3 capsules (2,250 mg total) by mouth 3 (three) times daily.  . Cholecalciferol (VITAMIN D3) 5000 units CAPS Take 5,000 Units by mouth daily.  . ciprofloxacin (CIPRO) 500 MG tablet Take 1 tablet (500 mg total) by mouth 2 (two) times daily for 10 days.  . cyclobenzaprine (FLEXERIL) 5 MG tablet Take by mouth.  . diphenhydrAMINE (BENADRYL) 25 MG tablet Take 50 mg by mouth daily as needed for allergies.  . DiphenhydrAMINE HCl, Sleep, (ZZZQUIL PO) Take 1 Dose by mouth at bedtime as needed (sleep).  Marland Kitchen esomeprazole (NEXIUM) 20 MG packet Take 20 mg by mouth daily.  Marland Kitchen losartan (COZAAR) 50 MG tablet Take 1 tablet (50 mg total) by mouth daily.  . mesalamine (ROWASA) 4 g enema INSERT 1 ENEMA RECTALLY AT BEDTIME FOR 14 DAYS  . metoprolol tartrate (LOPRESSOR) 25 MG tablet Take 1 tablet (25 mg total) by mouth 2 (two) times daily.  . metroNIDAZOLE (FLAGYL) 500 MG tablet Take 1 tablet (500 mg total) by mouth 2 (two) times daily for 10 days.  . Misc Natural Products (OSTEO BI-FLEX TRIPLE STRENGTH PO)  Take 2 tablets by mouth daily.  . nitroGLYCERIN (NITROSTAT) 0.4 MG SL tablet Place 1 tablet (0.4 mg total) under the tongue every 5 (five) minutes x 3 doses as needed for chest pain.  . traZODone (DESYREL) 50 MG tablet Take 0.5-1 tablets (25-50 mg total) by mouth at bedtime as needed for sleep.  Marland Kitchen zinc gluconate 50 MG tablet Take 50 mg by mouth daily.   No facility-administered encounter medications on file as of 03/21/2020.   Allergies  Allergen Reactions  . Diclofenac Sodium Shortness Of Breath and Swelling    respiratory difficulty  . Skelaxin [Metaxalone] Nausea And Vomiting  . Trazodone And Nefazodone Other (See  Comments)     Head felt funny    Patient Active Problem List   Diagnosis Date Noted  . Cervical spinal stenosis 06/04/2019  . Unstable angina ([place]) 09/07/2016  . High cholesterol 09/06/2016  . Non-ST elevation (NSTEMI) myocardial infarction ([place]) 09/01/2016  . CAD (coronary artery disease) 09/01/2016  . Umbilical hernia 09/81/1914  . Multiple sclerosis ([place]) 12/15/2014  . DDD (degenerative disc disease), lumbar 09/22/2013  . Chronic insomnia 09/22/2013  . Family history of early CAD 09/22/2013  . Depression, major, single episode, complete remission ([place]) 01/18/2011  . Ulcerative proctosigmoiditis with complication ([place]) 78/29/5621  . Mild intermittent asthma 01/07/2009  . Essential hypertension, benign 04/14/2007  . ALLERGIC RHINITIS 04/14/2007  . GERD 04/14/2007   Social History   Socioeconomic History  . Marital status: Divorced    Spouse name: Not on file  . Number of children: 2  . Years of education: Not on file  . Highest education level: Not on file  Occupational History  . Occupation: Physicist, medical: [employer]  Tobacco Use  . Smoking status: Never Smoker  . Smokeless tobacco: Never Used  Vaping Use  . Vaping Use: Never used  Substance and Sexual Activity  . Alcohol use: Yes    Alcohol/week: 0.0 standard drinks    Comment: rarely  . Drug use: No  . Sexual activity: Not Currently  Other Topics Concern  . Not on file  Social History Narrative   HSG   Divorced, not sexually active   1 son- '92, 1 daughter - '97   Work: Lobbyist- shipping/ rec'ing; quality management   Patient has never smoked   Alcohol use- rare 1-2 a month   Illicit Drug use- no    Patient gets regular exercise.   Social Determinants of Health   Financial Resource Strain: Not on file  Food Insecurity: Not on file  Transportation Needs: Not on file  Physical Activity: Not on file  Stress: Not on file  Social Connections: Not on file  Intimate Partner  Violence: Not on file    Mr. Vanessen family history includes Colitis in his mother; Coronary artery disease in his brother, father, and mother; Diabetes in his brother and father; Heart attack in his father and mother; Heart attack (age of onset: 24) in his brother; Hyperlipidemia in his father and mother; Hypertension in his brother, father, mother, and sister; Transient ischemic attack in his sister.      Objective:    Vitals:   03/21/20 0906  BP: 140/90  Pulse: 68    Physical Exam Well-developed well-nourished Wmale in no acute distress.  Height, Weight, BMI 28.1  HEENT; nontraumatic normocephalic, EOMI, PE R LA, sclera anicteric. Oropharynx;not done Neck; supple, no JVD Cardiovascular; regular rate and rhythm with S1-S2, no murmur rub or gallop  Pulmonary; Clear bilaterally Abdomen; soft, nontender, nondistended, no palpable mass or hepatosplenomegaly, bowel sounds are active Rectal;no external lesion noted on digital soft fullness on right at tip of exam finger , on anoscopy edematous fullness with red blood and purulent material  Skin; benign exam, no jaundice rash or appreciable lesions Extremities; no clubbing cyanosis or edema skin warm and dry Neuro/Psych; alert and oriented x4, grossly nonfocal mood and affect appropriate       Assessment & Plan:   #84 55 year old white male with history of chronic ulcerative proctosigmoiditis which has been well controlled with Colazal 750 mg 3 p.o. 3 times daily.  Patient comes in today with 8 to 9-day history of internal rectal pain/discomfort associated with hematochezia with bowel movements.  No associated fever or chills, He had one episode of diarrhea prior to onset but bowel movements have been normal since. On anoscopy today there is concern for probable rectal abscess.  Bright red blood and purulent material with pressure from the anoscope.  #2 coronary artery disease 3.  GERD 4.  Hypertension  Plan CBC with differential,  sed rate, CRP, be met Start Cipro 500 mg p.o. twice daily x10 days, start Flagyl 500 mg p.o. twice daily x10 days We have contacted [facility] and patient will be seen there in their urgent clinic today.   May need pelvic imaging but will defer to [facility].  Continue Colazal 750 mg 3 p.o. 3 times daily  Rishik Tubby S Marielis Samara PA-C 03/21/2020   Cc: Jinny Sanders, MD

## 2020-03-21 NOTE — Progress Notes (Signed)
Reviewed and agree with management plan.  Chamya Hunton T. Daesia Zylka, MD FACG [facility]  

## 2020-03-21 NOTE — Patient Instructions (Addendum)
If you are age 56 or older, your body mass index should be between 23-30. Your Body mass index is 28.19 kg/m. If this is out of the aforementioned range listed, please consider follow up with your Primary Care Provider.  If you are age 44 or younger, your body mass index should be between 19-25. Your Body mass index is 28.19 kg/m. If this is out of the aformentioned range listed, please consider follow up with your Primary Care Provider.   Your provider has requested that you go to the basement level for lab work before leaving today. Press "B" on the elevator. The lab is located at the first door on the left as you exit the elevator.  We have sent the following medications to your pharmacy: Cipro 500 mg 1 tablet twice daily for 10 days Metronidazole 500 mg 1 tablet twice daily 10 days  Continue Balsalazide (Colazal)  Soak in the tub for 20 minutes 2-3 days daily  You have been referred to [facility]. You have been scheduled to be seen at their Urgent Clinic today at 3:45, please arrive at 3:15. Bring you photo ID and insurance card to your appointment. They are located at:  [address], [place], [place] [address]  Thank you for entrusting me with your care and choosing [facility].  Amy Esterwood, PA-C

## 2020-03-22 LAB — HIV ANTIBODY (ROUTINE TESTING W REFLEX): HIV 1&2 Ab, 4th Generation: NONREACTIVE

## 2020-03-22 LAB — HEPATITIS C ANTIBODY
Hepatitis C Ab: NONREACTIVE
SIGNAL TO CUT-OFF: 0.01 (ref <1.00)

## 2020-03-22 LAB — HEPATITIS B SURFACE ANTIGEN: Hepatitis B Surface Ag: NONREACTIVE

## 2020-03-22 LAB — HEPATITIS B SURFACE ANTIBODY,QUALITATIVE: Hep B S Ab: REACTIVE — AB

## 2020-03-22 LAB — RPR: RPR Ser Ql: NONREACTIVE

## 2020-03-22 LAB — TRICHOMONAS VAGINALIS RNA, QL,MALES: Trichomonas vaginalis RNA: NOT DETECTED

## 2020-03-22 LAB — C. TRACHOMATIS/N. GONORRHOEAE RNA
C. trachomatis RNA, TMA: NOT DETECTED
N. gonorrhoeae RNA, TMA: NOT DETECTED

## 2020-03-22 LAB — HEPATITIS B CORE ANTIBODY, IGM: Hep B C IgM: NONREACTIVE

## 2020-04-07 ENCOUNTER — Encounter: Payer: Self-pay | Admitting: Family Medicine

## 2020-04-07 ENCOUNTER — Telehealth (INDEPENDENT_AMBULATORY_CARE_PROVIDER_SITE_OTHER): Payer: BC Managed Care – PPO | Admitting: Family Medicine

## 2020-04-07 VITALS — BP 138/83 | Temp 98.6°F | Ht 68.0 in | Wt 180.0 lb

## 2020-04-07 DIAGNOSIS — K51319 Ulcerative (chronic) rectosigmoiditis with unspecified complications: Secondary | ICD-10-CM | POA: Diagnosis not present

## 2020-04-07 DIAGNOSIS — R197 Diarrhea, unspecified: Secondary | ICD-10-CM

## 2020-04-07 MED ORDER — ONDANSETRON HCL 4 MG PO TABS: 4.0000 mg | ORAL_TABLET | Freq: Three times a day (TID) | ORAL | 0 refills | Status: DC | PRN

## 2020-04-07 NOTE — Progress Notes (Signed)
VIRTUAL VISIT Due to national recommendations of social distancing due to [place] 19, a virtual visit is felt to be most appropriate for this patient at this time.   I connected with the patient on 04/07/20 at  3:40 PM EST by virtual telehealth platform and verified that I am speaking with the correct person using two identifiers. Interactive audio and video telecommunications were attempted between this provider and patient, however failed, due to patient having technical difficulties OR patient did not have access to video capability.  We continued and completed visit with audio only.     I discussed the limitations, risks, security and privacy concerns of performing an evaluation and management service by  virtual telehealth platform and the availability of in person appointments. I also discussed with the patient that there may be a patient responsible charge related to this service. The patient expressed understanding and agreed to proceed.  Patient location: Home Provider Location: [facility] Participants: Eliezer Lofts and [name]   Chief Complaint  Patient presents with  . Diarrhea    Fever    . Vomiting    Got first dose of Moderna on 03/29/2020-Negative Covid Test 04/05/2020    History of Present Illness: 55 year old male with history of HTN, CAD, ulcerative colitis, multiple sclerosis, on immunocompromising meds (colazal) present with fever diarrhea, nausea and vomiting in last 6 days. Started with initial upset stomach, 101F, diarrhea 10-11 times day, emesis x 1. Fever resolved but nausea and diarrhea less but  Continued. 2 loose watery stools today.  He has been trying to push fluids. Normal urine output.  No current abdominal pain.  No rectal pain, no blood in stool.  COVID 19 screen COVID testing:04/05/2020 neg rapid Covid test COVID vaccine:03/29/2020 Moderna only. Had associate muscle cramps and fatigue. COVID exposure: No recent travel or known exposure  to COVID19, no exposure  The importance of social distancing was discussed today.    Review of Systems  Constitutional: Negative for chills and fever.  HENT: Negative for congestion and ear pain.   Eyes: Negative for pain and redness.  Respiratory: Negative for cough and shortness of breath.   Cardiovascular: Negative for chest pain, palpitations and leg swelling.  Gastrointestinal: Negative for abdominal pain, blood in stool, constipation, diarrhea, nausea and vomiting.  Genitourinary: Negative for dysuria.  Musculoskeletal: Negative for falls and myalgias.  Skin: Negative for rash.  Neurological: Negative for dizziness.  Psychiatric/Behavioral: Negative for depression. The patient is not nervous/anxious.       Past Medical History:  Diagnosis Date  . Abnormal liver function test   . Allergic rhinitis   . Allergy   . Anxiety   . CAD (coronary artery disease) 09/01/2016   a.  09/01/16; NSTEMI LHC: 60-70% ostial LAD stenosis, 20% mid LAD, 30% RCA. EF 55-65%  b. 09/07/16 [place] s/p DES to oLAD  . DDD (degenerative disc disease)   . Depression   . GERD (gastroesophageal reflux disease)   . Hyperlipidemia   . Hypertension    no meds  . Insomnia   . MS (multiple sclerosis) ([place])   . Myocardial infarction ([place])    2018  . Neuromuscular disorder ([place])    MS  . Ulcerative colitis    left sided    reports that he has never smoked. He has never used smokeless tobacco. He reports current alcohol use. He reports that he does not use drugs.   Current Outpatient Medications:  .  acetaminophen (TYLENOL) 500 MG  tablet, Take 1,000 mg by mouth daily as needed for moderate pain or headache., Disp: , Rfl:  .  Ascorbic Acid (VITAMIN C) 1000 MG tablet, Take 1,000 mg by mouth daily., Disp: , Rfl:  .  aspirin 81 MG EC tablet, Take 1 tablet (81 mg total) by mouth daily., Disp: 30 tablet, Rfl: 11 .  atorvastatin (LIPITOR) 40 MG tablet, Take 1 tablet (40 mg total) by mouth daily., Disp: 90 tablet,  Rfl: 3 .  balsalazide (COLAZAL) 750 MG capsule, Take 3 capsules (2,250 mg total) by mouth 3 (three) times daily., Disp: 270 capsule, Rfl: 11 .  Cholecalciferol (VITAMIN D3) 5000 units CAPS, Take 5,000 Units by mouth daily., Disp: , Rfl:  .  cyclobenzaprine (FLEXERIL) 5 MG tablet, Take by mouth., Disp: , Rfl:  .  diphenhydrAMINE (BENADRYL) 25 MG tablet, Take 50 mg by mouth daily as needed for allergies., Disp: , Rfl:  .  DiphenhydrAMINE HCl, Sleep, (ZZZQUIL PO), Take 1 Dose by mouth at bedtime as needed (sleep)., Disp: , Rfl:  .  esomeprazole (NEXIUM) 20 MG packet, Take 20 mg by mouth daily., Disp: , Rfl:  .  Glucosamine-Chondroitin 250-200 MG TABS, Take by mouth., Disp: , Rfl:  .  losartan (COZAAR) 50 MG tablet, Take 1 tablet (50 mg total) by mouth daily., Disp: 90 tablet, Rfl: 3 .  mesalamine (ROWASA) 4 g enema, INSERT 1 ENEMA RECTALLY AT BEDTIME FOR 14 DAYS, Disp: 840 mL, Rfl: 0 .  metoprolol tartrate (LOPRESSOR) 25 MG tablet, Take 1 tablet (25 mg total) by mouth 2 (two) times daily., Disp: 180 tablet, Rfl: 3 .  Misc Natural Products (OSTEO BI-FLEX TRIPLE STRENGTH PO), Take 2 tablets by mouth daily., Disp: , Rfl:  .  nitroGLYCERIN (NITROSTAT) 0.4 MG SL tablet, Place 1 tablet (0.4 mg total) under the tongue every 5 (five) minutes x 3 doses as needed for chest pain., Disp: 25 tablet, Rfl: 6 .  traZODone (DESYREL) 50 MG tablet, Take 0.5-1 tablets (25-50 mg total) by mouth at bedtime as needed for sleep., Disp: 30 tablet, Rfl: 3 .  zinc gluconate 50 MG tablet, Take 50 mg by mouth daily., Disp: , Rfl:    Observations/Objective: Blood pressure 138/83, temperature 98.6 F (37 C), temperature source Temporal, height 5' 8"  (1.727 m), weight 180 lb (81.6 kg).  Physical Exam  Physical Exam Constitutional:      General: The patient is not in acute distress. Pulmonary:     Effort: Pulmonary effort is normal. No respiratory distress.  Neurological:     Mental Status: The patient is alert and oriented  to person, place, and time.  Psychiatric:        Mood and Affect: Mood normal.        Behavior: Behavior normal.   Assessment and Plan Problem List Items Addressed This Visit    Acute diarrhea - Primary     Not clearly due to COVID.Marland Kitchen likely other viral illness vs UC flare.  Encouraged fluids, rest, prescribe zofran to use prn.       Ulcerative proctosigmoiditis with complication ([place])    Atypical symptoms for him.. not clearly flare.   Followed by GI.           I discussed the assessment and treatment plan with the patient. The patient was provided an opportunity to ask questions and all were answered. The patient agreed with the plan and demonstrated an understanding of the instructions.   The patient was advised to call back or seek an  in-person evaluation if the symptoms worsen or if the condition fails to improve as anticipated.   Total visit time 20 minutes, > 50% spent counseling and cordinating patients care.   Eliezer Lofts, MD

## 2020-04-14 ENCOUNTER — Other Ambulatory Visit: Payer: Self-pay | Admitting: Family Medicine

## 2020-04-28 DIAGNOSIS — M79605 Pain in left leg: Secondary | ICD-10-CM | POA: Insufficient documentation

## 2020-04-28 NOTE — Assessment & Plan Note (Signed)
Possible superficial phlebitis now resolved vs MSK strain, no S/S of DVT.  Ice, gentle stretching, tylenol for pain. Call if not improving.

## 2020-05-16 DIAGNOSIS — R197 Diarrhea, unspecified: Secondary | ICD-10-CM | POA: Insufficient documentation

## 2020-05-16 NOTE — Assessment & Plan Note (Signed)
Atypical symptoms for him.. not clearly flare.   Followed by GI.

## 2020-05-16 NOTE — Assessment & Plan Note (Signed)
Not clearly due to COVID.Marland Kitchen likely other viral illness vs UC flare.  Encouraged fluids, rest, prescribe zofran to use prn.

## 2020-06-10 ENCOUNTER — Other Ambulatory Visit: Payer: Self-pay | Admitting: Family Medicine

## 2020-06-10 ENCOUNTER — Other Ambulatory Visit: Payer: Self-pay | Admitting: Gastroenterology

## 2020-06-10 NOTE — Telephone Encounter (Signed)
Please schedule CPE with fasting labs prior for [name].

## 2020-06-10 NOTE — Telephone Encounter (Signed)
Left voice message to call the office  

## 2020-06-22 ENCOUNTER — Telehealth: Payer: Self-pay | Admitting: Family Medicine

## 2020-06-22 DIAGNOSIS — E7800 Pure hypercholesterolemia, unspecified: Secondary | ICD-10-CM

## 2020-06-22 DIAGNOSIS — Z125 Encounter for screening for malignant neoplasm of prostate: Secondary | ICD-10-CM

## 2020-06-22 NOTE — Telephone Encounter (Signed)
-----   Message from Cloyd Stagers, RT sent at 06/20/2020 10:46 AM EDT ----- Regarding: Lab Orders for Friday 4.22.2022 Please place lab orders for Friday 4.22.2022, office visit for physical on Friday 4.29.2022 Thank you, Dyke Maes RT(R)

## 2020-06-24 ENCOUNTER — Other Ambulatory Visit: Payer: Self-pay | Admitting: Neurosurgery

## 2020-06-24 DIAGNOSIS — M5416 Radiculopathy, lumbar region: Secondary | ICD-10-CM

## 2020-06-30 ENCOUNTER — Ambulatory Visit
Admission: RE | Admit: 2020-06-30 | Discharge: 2020-06-30 | Disposition: A | Discharge status: Home / Self Care | Payer: BC Managed Care – PPO | Source: Ambulatory Visit | Attending: Neurosurgery | Admitting: Neurosurgery

## 2020-06-30 DIAGNOSIS — M5416 Radiculopathy, lumbar region: Secondary | ICD-10-CM

## 2020-06-30 MED ORDER — METHYLPREDNISOLONE ACETATE 40 MG/ML INJ SUSP (RADIOLOG
80.0000 mg | Freq: Once | INTRAMUSCULAR | Status: AC
  Administered 2020-06-30: 80 mg via EPIDURAL

## 2020-06-30 MED ORDER — IOPAMIDOL (ISOVUE-M 200) INJECTION 41%
1.0000 mL | Freq: Once | INTRAMUSCULAR | Status: AC
  Administered 2020-06-30: 1 mL via EPIDURAL

## 2020-06-30 NOTE — Discharge Instructions (Signed)

## 2020-07-08 ENCOUNTER — Other Ambulatory Visit: Payer: Self-pay

## 2020-07-08 ENCOUNTER — Other Ambulatory Visit (INDEPENDENT_AMBULATORY_CARE_PROVIDER_SITE_OTHER): Payer: BC Managed Care – PPO

## 2020-07-08 DIAGNOSIS — Z125 Encounter for screening for malignant neoplasm of prostate: Secondary | ICD-10-CM | POA: Diagnosis not present

## 2020-07-08 DIAGNOSIS — E7800 Pure hypercholesterolemia, unspecified: Secondary | ICD-10-CM

## 2020-07-08 LAB — COMPREHENSIVE METABOLIC PANEL
ALT: 27 U/L (ref 0–53)
AST: 24 U/L (ref 0–37)
Albumin: 4.1 g/dL (ref 3.5–5.2)
Alkaline Phosphatase: 55 U/L (ref 39–117)
BUN: 15 mg/dL (ref 6–23)
CO2: 29 mEq/L (ref 19–32)
Calcium: 8.9 mg/dL (ref 8.4–10.5)
Chloride: 105 mEq/L (ref 96–112)
Creatinine, Ser: 1.21 mg/dL (ref 0.40–1.50)
GFR: 67.33 mL/min (ref >60.00)
Glucose, Bld: 84 mg/dL (ref 70–99)
Potassium: 4.3 mEq/L (ref 3.5–5.1)
Sodium: 140 mEq/L (ref 135–145)
Total Bilirubin: 1.4 mg/dL — ABNORMAL HIGH (ref 0.2–1.2)
Total Protein: 6.2 g/dL (ref 6.0–8.3)

## 2020-07-08 LAB — LIPID PANEL
Cholesterol: 114 mg/dL (ref 0–200)
HDL: 37.7 mg/dL — ABNORMAL LOW (ref >39.00)
LDL Cholesterol: 62 mg/dL (ref 0–99)
NonHDL: 76.02
Total CHOL/HDL Ratio: 3
Triglycerides: 72 mg/dL (ref 0.0–149.0)
VLDL: 14.4 mg/dL (ref 0.0–40.0)

## 2020-07-08 LAB — PSA: PSA: 0.99 ng/mL (ref 0.10–4.00)

## 2020-07-08 NOTE — Progress Notes (Signed)
No critical labs need to be addressed urgently. We will discuss labs in detail at upcoming office visit.   

## 2020-07-15 ENCOUNTER — Encounter: Payer: BC Managed Care – PPO | Admitting: Family Medicine

## 2020-08-11 ENCOUNTER — Encounter: Payer: BC Managed Care – PPO | Admitting: Family Medicine

## 2020-08-26 ENCOUNTER — Encounter: Payer: BC Managed Care – PPO | Admitting: Family Medicine

## 2020-09-02 ENCOUNTER — Other Ambulatory Visit: Payer: Self-pay | Admitting: Gastroenterology

## 2020-09-02 ENCOUNTER — Other Ambulatory Visit: Payer: Self-pay | Admitting: Family Medicine

## 2020-09-02 NOTE — Telephone Encounter (Signed)
Left message for John Kelly to return my call.

## 2020-09-02 NOTE — Telephone Encounter (Signed)
Pharmacy sent refill back stating they have listed that patient has allergies with statins. Please advise.

## 2020-10-07 ENCOUNTER — Encounter: Payer: Self-pay | Admitting: Family Medicine

## 2020-10-07 ENCOUNTER — Other Ambulatory Visit: Payer: Self-pay

## 2020-10-07 ENCOUNTER — Ambulatory Visit (INDEPENDENT_AMBULATORY_CARE_PROVIDER_SITE_OTHER): Payer: BC Managed Care – PPO | Admitting: Family Medicine

## 2020-10-07 VITALS — BP 140/72 | HR 72 | Temp 97.7°F | Ht 66.75 in | Wt 181.2 lb

## 2020-10-07 DIAGNOSIS — F325 Major depressive disorder, single episode, in full remission: Secondary | ICD-10-CM

## 2020-10-07 DIAGNOSIS — K51319 Ulcerative (chronic) rectosigmoiditis with unspecified complications: Secondary | ICD-10-CM

## 2020-10-07 DIAGNOSIS — I10 Essential (primary) hypertension: Secondary | ICD-10-CM

## 2020-10-07 DIAGNOSIS — G35 Multiple sclerosis: Secondary | ICD-10-CM | POA: Diagnosis not present

## 2020-10-07 DIAGNOSIS — E7800 Pure hypercholesterolemia, unspecified: Secondary | ICD-10-CM | POA: Diagnosis not present

## 2020-10-07 DIAGNOSIS — F5104 Psychophysiologic insomnia: Secondary | ICD-10-CM

## 2020-10-07 DIAGNOSIS — Z0000 Encounter for general adult medical examination without abnormal findings: Secondary | ICD-10-CM

## 2020-10-07 MED ORDER — ZOLPIDEM TARTRATE 5 MG PO TABS
5.0000 mg | ORAL_TABLET | Freq: Every evening | ORAL | 1 refills | Status: DC | PRN
Start: 2020-10-07 — End: 2020-11-18

## 2020-10-07 NOTE — Assessment & Plan Note (Signed)
Stable, chronic  Followed by Neuro

## 2020-10-07 NOTE — Assessment & Plan Note (Signed)
Resolved. Still some insomnia issues.

## 2020-10-07 NOTE — Assessment & Plan Note (Addendum)
Stable chronic  Followed by GI

## 2020-10-07 NOTE — Assessment & Plan Note (Signed)
Stable, chronic.  Continue current medication.   Follow BP at home.. call if > 140/90.  Losartan 50 mg daily.

## 2020-10-07 NOTE — Patient Instructions (Addendum)
Keep up with water intake.  Continue work on exercise, weight loss, healthy eating habits. Trial of ambien for insomnia.Marland Kitchen limit use as able.

## 2020-10-07 NOTE — Assessment & Plan Note (Addendum)
Stable, chronic.  Continue current medication.  LDL goal < 70 atorvastatin 40 mg daily

## 2020-10-07 NOTE — Assessment & Plan Note (Signed)
SE to trazodone  trial of low dose Ambien 5 mg qhs

## 2020-10-07 NOTE — Progress Notes (Signed)
Patient ID: John Kelly, male    DOB: 28-May-1965, 55 y.o.   MRN: [id number]  This visit was conducted in person.  BP 140/72   Pulse 72   Temp 97.7 F (36.5 C) (Temporal)   Ht 5' 6.75" (1.695 m)   Wt 181 lb 4 oz (82.2 kg)   SpO2 96%   BMI 28.60 kg/m    CC: Chief Complaint  Patient presents with   Annual Exam    Subjective:   HPI: John Kelly is a 55 y.o. male presenting on 10/07/2020 for Annual Exam  Hypertension:   On losartan 50 mg daily.  Using medication without problems or lightheadedness:  Chest pain with exertion: Edema: Short of breath: Average home BPs: Other issues:  CAD followed by Dr, Claiborne Billings Elevated Cholesterol:  LDL at goal < 70 on atorvastatin 40 mg daily Lab Results  Component Value Date   CHOL 114 07/08/2020   HDL 37.70 (L) 07/08/2020   LDLCALC 62 07/08/2020   LDLDIRECT 103.0 06/01/2019   TRIG 72.0 07/08/2020   CHOLHDL 3 07/08/2020  Using medications without problems: Muscle aches:  Diet compliance: moderate Exercise: 4-5 days a week Other complaints:  Wt Readings from Last 3 Encounters:  10/07/20 181 lb 4 oz (82.2 kg)  04/07/20 180 lb (81.6 kg)  03/21/20 180 lb (81.6 kg)    Cervical spinal stenosis: followed by Dr. Lacinda Axon. Last OV 06/21/2020  reviewed.  MDD, chronic insomnia: Has trouble falling asleep.  Trazodone gave Side effect.  Ambien worked well in past.. would use it every 3 days or so.  MS: followed by neuro  Dr. Manuella Ghazi Mild disease course. On no chronic meds.  Just feels fatigued.     Ulcerative colitis followed by GI. On Canasa and balsalazide.  Patient Care Team: Jinny Sanders, MD as PCP - General (Family Medicine) Troy Sine, MD as PCP - Cardiology (Cardiology)  Dr. Justin Mend Dr.  Lacinda Axon Neurosurgery.       Relevant past medical, surgical, family and social history reviewed and updated as indicated. Interim medical history since our last visit reviewed. Allergies and medications reviewed and updated. Outpatient  Medications Prior to Visit  Medication Sig Dispense Refill   acetaminophen (TYLENOL) 500 MG tablet Take 1,000 mg by mouth daily as needed for moderate pain or headache.     Ascorbic Acid (VITAMIN C) 1000 MG tablet Take 1,000 mg by mouth daily.     aspirin 81 MG EC tablet Take 1 tablet (81 mg total) by mouth daily. 30 tablet 11   atorvastatin (LIPITOR) 40 MG tablet TAKE 1 TABLET BY MOUTH ONCE DAILY 90 tablet 0   balsalazide (COLAZAL) 750 MG capsule Take 3 capsules (2,250 mg total) by mouth 3 (three) times daily. 270 capsule 11   Cholecalciferol (VITAMIN D3) 5000 units CAPS Take 5,000 Units by mouth daily.     cyclobenzaprine (FLEXERIL) 5 MG tablet Take by mouth.     diphenhydrAMINE (BENADRYL) 25 MG tablet Take 50 mg by mouth daily as needed for allergies.     DiphenhydrAMINE HCl, Sleep, (ZZZQUIL PO) Take 1 Dose by mouth at bedtime as needed (sleep).     esomeprazole (NEXIUM) 20 MG packet Take 20 mg by mouth daily.     Glucosamine-Chondroitin 250-200 MG TABS Take by mouth.     losartan (COZAAR) 50 MG tablet Take 1 tablet by mouth once daily 90 tablet 0   mesalamine (CANASA) 1000 MG suppository INSERT 1 SUPPOSITORY RECTALLY ONCE DAILY 30 suppository  0   mesalamine (ROWASA) 4 g enema INSERT 1 ENEMA RECTALLY AT BEDTIME FOR 14 DAYS 840 mL 0   metoprolol tartrate (LOPRESSOR) 25 MG tablet Take 1 tablet (25 mg total) by mouth 2 (two) times daily. 180 tablet 3   Misc Natural Products (OSTEO BI-FLEX TRIPLE STRENGTH PO) Take 2 tablets by mouth daily.     nitroGLYCERIN (NITROSTAT) 0.4 MG SL tablet Place 1 tablet (0.4 mg total) under the tongue every 5 (five) minutes x 3 doses as needed for chest pain. 25 tablet 6   ondansetron (ZOFRAN) 4 MG tablet Take 1 tablet (4 mg total) by mouth every 8 (eight) hours as needed for nausea or vomiting. 20 tablet 0   zinc gluconate 50 MG tablet Take 50 mg by mouth daily.     traZODone (DESYREL) 50 MG tablet TAKE 1/2 TO 1 (ONE-HALF TO ONE) TABLET BY MOUTH AT BEDTIME AS  NEEDED FOR SLEEP 30 tablet 2   No facility-administered medications prior to visit.     Per HPI unless specifically indicated in ROS section below Review of Systems  Constitutional:  Negative for fatigue and fever.  HENT:  Negative for ear pain.   Eyes:  Negative for pain.  Respiratory:  Negative for cough and shortness of breath.   Cardiovascular:  Negative for chest pain, palpitations and leg swelling.  Gastrointestinal:  Negative for abdominal pain.  Genitourinary:  Negative for dysuria.  Musculoskeletal:  Negative for arthralgias.  Neurological:  Negative for syncope, light-headedness and headaches.  Psychiatric/Behavioral:  Negative for dysphoric mood.   Objective:  BP 140/72   Pulse 72   Temp 97.7 F (36.5 C) (Temporal)   Ht 5' 6.75" (1.695 m)   Wt 181 lb 4 oz (82.2 kg)   SpO2 96%   BMI 28.60 kg/m   Wt Readings from Last 3 Encounters:  10/07/20 181 lb 4 oz (82.2 kg)  04/07/20 180 lb (81.6 kg)  03/21/20 180 lb (81.6 kg)      Physical Exam Constitutional:      General: He is not in acute distress.    Appearance: Normal appearance. He is well-developed. He is not ill-appearing or toxic-appearing.  HENT:     Head: Normocephalic and atraumatic.     Right Ear: Hearing, tympanic membrane, ear canal and external ear normal.     Left Ear: Hearing, tympanic membrane, ear canal and external ear normal.     Nose: Nose normal.     Mouth/Throat:     Pharynx: Uvula midline.  Eyes:     General: Lids are normal. Lids are everted, no foreign bodies appreciated.     Conjunctiva/sclera: Conjunctivae normal.     Pupils: Pupils are equal, round, and reactive to light.  Neck:     Thyroid: No thyroid mass or thyromegaly.     Vascular: No carotid bruit.     Trachea: Trachea and phonation normal.  Cardiovascular:     Rate and Rhythm: Normal rate and regular rhythm.     Pulses: Normal pulses.     Heart sounds: S1 normal and S2 normal. No murmur heard.   No gallop.  Pulmonary:      Breath sounds: Normal breath sounds. No wheezing, rhonchi or rales.  Abdominal:     General: Bowel sounds are normal.     Palpations: Abdomen is soft.     Tenderness: There is no abdominal tenderness. There is no guarding or rebound.     Hernia: No hernia is present.  Musculoskeletal:  Cervical back: Normal range of motion and neck supple.  Lymphadenopathy:     Cervical: No cervical adenopathy.  Skin:    General: Skin is warm and dry.     Findings: No rash.  Neurological:     Mental Status: He is alert.     Cranial Nerves: No cranial nerve deficit.     Sensory: No sensory deficit.     Gait: Gait normal.     Deep Tendon Reflexes: Reflexes are normal and symmetric.  Psychiatric:        Speech: Speech normal.        Behavior: Behavior normal.        Judgment: Judgment normal.      Results for orders placed or performed in visit on 07/08/20  PSA  Result Value Ref Range   PSA 0.99 0.10 - 4.00 ng/mL  Comprehensive metabolic panel  Result Value Ref Range   Sodium 140 135 - 145 mEq/L   Potassium 4.3 3.5 - 5.1 mEq/L   Chloride 105 96 - 112 mEq/L   CO2 29 19 - 32 mEq/L   Glucose, Bld 84 70 - 99 mg/dL   BUN 15 6 - 23 mg/dL   Creatinine, Ser 1.21 0.40 - 1.50 mg/dL   Total Bilirubin 1.4 (H) 0.2 - 1.2 mg/dL   Alkaline Phosphatase 55 39 - 117 U/L   AST 24 0 - 37 U/L   ALT 27 0 - 53 U/L   Total Protein 6.2 6.0 - 8.3 g/dL   Albumin 4.1 3.5 - 5.2 g/dL   GFR 67.33 >60.00 mL/min   Calcium 8.9 8.4 - 10.5 mg/dL  Lipid panel  Result Value Ref Range   Cholesterol 114 0 - 200 mg/dL   Triglycerides 72.0 0.0 - 149.0 mg/dL   HDL 37.70 (L) >39.00 mg/dL   VLDL 14.4 0.0 - 40.0 mg/dL   LDL Cholesterol 62 0 - 99 mg/dL   Total CHOL/HDL Ratio 3    NonHDL 76.02     This visit occurred during the SARS-CoV-2 public health emergency.  Safety protocols were in place, including screening questions prior to the visit, additional usage of staff PPE, and extensive cleaning of exam room while  observing appropriate contact time as indicated for disinfecting solutions.   COVID 19 screen:  No recent travel or known exposure to COVID19 The patient denies respiratory symptoms of COVID 19 at this time. The importance of social distancing was discussed today.   Assessment and Plan   The patient's preventative maintenance and recommended screening tests for an annual wellness exam were reviewed in full today. Brought up to date unless services declined.  Counselled on the importance of diet, exercise, and its role in overall health and mortality. The patient's FH and SH was reviewed, including their home life, tobacco status, and drug and alcohol status.   Colonoscopy per GI q 3-5 years, last 06/2019 Vaccines:  uptodate with td, refused flu in past. COVID19 vaccine  x 1 had significant side effects,  consider shingrix  STD screen  not indicated.  ETOH/drugs: rare/none  nonsmoker Lab Results  Component Value Date   PSA 0.99 07/08/2020   PSA 0.81 04/10/2016   PSA 0.66 10/22/2013   Problem List Items Addressed This Visit     Chronic insomnia    SE to trazodone  trial of low dose Ambien 5 mg qhs       Depression, major, single episode, complete remission ([place])    Resolved. Still some insomnia issues.  Essential hypertension, benign    Stable, chronic.  Continue current medication.   Follow BP at home.. call if > 140/90.  Losartan 50 mg daily.       High cholesterol    Stable, chronic.  Continue current medication.  LDL goal < 70 atorvastatin 40 mg daily       Multiple sclerosis (HCC)    Stable, chronic  Followed by Neuro       Ulcerative proctosigmoiditis with complication (HCC)    Stable chronic  Followed by GI           Eliezer Lofts, MD

## 2020-11-16 ENCOUNTER — Ambulatory Visit: Payer: BC Managed Care – PPO | Admitting: Family Medicine

## 2020-11-16 ENCOUNTER — Encounter: Payer: Self-pay | Admitting: Family Medicine

## 2020-11-16 ENCOUNTER — Other Ambulatory Visit: Payer: Self-pay

## 2020-11-16 VITALS — BP 140/76 | HR 70 | Temp 98.8°F | Ht 66.75 in | Wt 178.0 lb

## 2020-11-16 DIAGNOSIS — M2550 Pain in unspecified joint: Secondary | ICD-10-CM

## 2020-11-16 DIAGNOSIS — G35 Multiple sclerosis: Secondary | ICD-10-CM | POA: Diagnosis not present

## 2020-11-16 DIAGNOSIS — Z202 Contact with and (suspected) exposure to infections with a predominantly sexual mode of transmission: Secondary | ICD-10-CM

## 2020-11-16 DIAGNOSIS — R309 Painful micturition, unspecified: Secondary | ICD-10-CM

## 2020-11-16 LAB — POC URINALSYSI DIPSTICK (AUTOMATED)
Bilirubin, UA: NEGATIVE
Blood, UA: NEGATIVE
Glucose, UA: NEGATIVE
Ketones, UA: NEGATIVE
Leukocytes, UA: NEGATIVE
Nitrite, UA: NEGATIVE
Protein, UA: NEGATIVE
Spec Grav, UA: 1.02 (ref 1.010–1.025)
Urobilinogen, UA: 0.2 E.U./dL
pH, UA: 6 (ref 5.0–8.0)

## 2020-11-16 NOTE — Progress Notes (Signed)
Berna Gitto T. Neleh Muldoon, MD, [facility] [place], [address]  Phone: [phone]  FAX: [phone]  John Kelly - 55 y.o. male  MRN [id number]  Date of Birth: 10-16-1965  Date: 11/16/2020  PCP: Jinny Sanders, MD  Referral: Jinny Sanders, MD  Chief Complaint  Patient presents with   Spasms   Dizziness   Pain with/without urination    This visit occurred during the SARS-CoV-2 public health emergency.  Safety protocols were in place, including screening questions prior to the visit, additional usage of staff PPE, and extensive cleaning of exam room while observing appropriate contact time as indicated for disinfecting solutions.   Subjective:   John Kelly is a 55 y.o. very pleasant male patient with Body mass index is 28.09 kg/m. who presents with the following:  Primary issue is dysuria and unprotected sexual exposure with concern and need for STD testing.  He does have MS. he takes mesalamine. Last wed. Had some muscle tension, arms shoulder, neck.  Pretty intense and then it eased.   - has MS, will get lightheaded.  Numbness and tingling in his feet.  Very recently.   He has been having dysuria intermittently. Intercourse - 2 partners, no protection.  A few months ago. He denies any penile discharge, ulceration or any topical changes at all.  Some dizziness with sacccades and pursuits.  He had rotational movements of the head.  Review of Systems is noted in the HPI, as appropriate  Objective:   BP 140/76   Pulse 70   Temp 98.8 F (37.1 C) (Temporal)   Ht 5' 6.75" (1.695 m)   Wt 178 lb (80.7 kg)   SpO2 98%   BMI 28.09 kg/m   GEN: No acute distress; alert,appropriate. PULM: Breathing comfortably in no respiratory distress PSYCH: Normally interactive.   Full musculoskeletal exam, and he has full range of motion at the shoulders, elbows, hands and wrists as well as the hips, and  knees, foot and ankle.  Strength is 5/5 throughout with neurovascular intact exam.  GU is deferred.  Laboratory and Imaging Data: Results for orders placed or performed in visit on 11/16/20  HIV Antibody (routine testing w rflx)  Result Value Ref Range   HIV 1&2 Ab, 4th Generation NON-REACTIVE NON-REACTIVE  POCT Urinalysis Dipstick (Automated)  Result Value Ref Range   Color, UA Yellow    Clarity, UA Clear    Glucose, UA Negative Negative   Bilirubin, UA Negative    Ketones, UA Negative    Spec Grav, UA 1.020 1.010 - 1.025   Blood, UA Negative    pH, UA 6.0 5.0 - 8.0   Protein, UA Negative Negative   Urobilinogen, UA 0.2 0.2 or 1.0 E.U./dL   Nitrite, UA Negative    Leukocytes, UA Negative Negative     Assessment and Plan:     ICD-10-CM   1. Pain with urination  R30.9 POCT Urinalysis Dipstick (Automated)    Urine Culture    HSV(herpes simplex vrs) 1+2 ab-IgG    HSV 1/2 Ab (IgM), IFA w/rflx Titer    2. Possible exposure to STD  Z20.2 C. trachomatis/N. gonorrhoeae RNA    Trichomonas vaginalis, RNA    HIV Antibody (routine testing w rflx)    RPR    HSV(herpes simplex vrs) 1+2 ab-IgG    HSV 1/2 Ab (IgM), IFA w/rflx Titer    CANCELED: HSV(herpes smplx)abs-1+2(IgG+IgM)-bld    3. Polyarthralgia  M25.50     4. Multiple sclerosis ([place])  G35      UA is negative, but with clinical history, obtain a urine culture.  Broad-spectrum STD work-up.  If positive, he will need treatment for certain sexually transmitted infections.  We discussed importance of protection.  In terms of his polymyalgia and tingling, I think that this is most likely from [place].  From a musculoskeletal exam standpoint, he does not have any focal joint swelling, his strength is excellent, and I am unable to provoke significant symptoms.  Additional prior labs, testing, and specialist notes are reviewed.  No orders of the defined types were placed in this encounter.  Medications Discontinued During This  Encounter  Medication Reason   mesalamine (ROWASA) 4 g enema Completed Course   ondansetron (ZOFRAN) 4 MG tablet Completed Course   Orders Placed This Encounter  Procedures   C. trachomatis/N. gonorrhoeae RNA   Trichomonas vaginalis, RNA   Urine Culture   HIV Antibody (routine testing w rflx)   RPR   HSV(herpes simplex vrs) 1+2 ab-IgG   HSV 1/2 Ab (IgM), IFA w/rflx Titer   POCT Urinalysis Dipstick (Automated)    Follow-up: No follow-ups on file.  [employer] speech-to-text software was used for transcription in this dictation.  Possible transcriptional errors can occur using Editor, commissioning.   Signed,  Maud Deed. Xayvier Vallez, MD   Outpatient Encounter Medications as of 11/16/2020  Medication Sig   acetaminophen (TYLENOL) 500 MG tablet Take 1,000 mg by mouth daily as needed for moderate pain or headache.   Ascorbic Acid (VITAMIN C) 1000 MG tablet Take 1,000 mg by mouth daily.   aspirin 81 MG EC tablet Take 1 tablet (81 mg total) by mouth daily.   atorvastatin (LIPITOR) 40 MG tablet TAKE 1 TABLET BY MOUTH ONCE DAILY   balsalazide (COLAZAL) 750 MG capsule Take 3 capsules (2,250 mg total) by mouth 3 (three) times daily.   Cholecalciferol (VITAMIN D3) 5000 units CAPS Take 5,000 Units by mouth daily.   cyclobenzaprine (FLEXERIL) 5 MG tablet Take by mouth.   diphenhydrAMINE (BENADRYL) 25 MG tablet Take 50 mg by mouth daily as needed for allergies.   DiphenhydrAMINE HCl, Sleep, (ZZZQUIL PO) Take 1 Dose by mouth at bedtime as needed (sleep).   esomeprazole (NEXIUM) 20 MG packet Take 20 mg by mouth daily.   Glucosamine-Chondroitin 250-200 MG TABS Take by mouth.   losartan (COZAAR) 50 MG tablet Take 1 tablet by mouth once daily   mesalamine (CANASA) 1000 MG suppository INSERT 1 SUPPOSITORY RECTALLY ONCE DAILY   metoprolol tartrate (LOPRESSOR) 25 MG tablet Take 1 tablet (25 mg total) by mouth 2 (two) times daily.   Misc Natural Products (OSTEO BI-FLEX TRIPLE STRENGTH PO) Take 2  tablets by mouth daily.   nitroGLYCERIN (NITROSTAT) 0.4 MG SL tablet Place 1 tablet (0.4 mg total) under the tongue every 5 (five) minutes x 3 doses as needed for chest pain.   zinc gluconate 50 MG tablet Take 50 mg by mouth daily.   zolpidem (AMBIEN) 5 MG tablet Take 1 tablet (5 mg total) by mouth at bedtime as needed for sleep.   [DISCONTINUED] mesalamine (ROWASA) 4 g enema INSERT 1 ENEMA RECTALLY AT BEDTIME FOR 14 DAYS   [DISCONTINUED] ondansetron (ZOFRAN) 4 MG tablet Take 1 tablet (4 mg total) by mouth every 8 (eight) hours as needed for nausea or vomiting.   No facility-administered encounter medications on file as of 11/16/2020.

## 2020-11-17 LAB — URINE CULTURE
MICRO NUMBER:: 12316085
Result:: NO GROWTH
SPECIMEN QUALITY:: ADEQUATE

## 2020-11-17 LAB — TRICHOMONAS VAGINALIS RNA, QL,MALES: Trichomonas vaginalis RNA: NOT DETECTED

## 2020-11-17 LAB — HIV ANTIBODY (ROUTINE TESTING W REFLEX): HIV 1&2 Ab, 4th Generation: NONREACTIVE

## 2020-11-17 LAB — C. TRACHOMATIS/N. GONORRHOEAE RNA
C. trachomatis RNA, TMA: NOT DETECTED
N. gonorrhoeae RNA, TMA: NOT DETECTED

## 2020-11-17 LAB — RPR: RPR Ser Ql: NONREACTIVE

## 2020-11-18 ENCOUNTER — Other Ambulatory Visit: Payer: Self-pay | Admitting: Family Medicine

## 2020-11-18 NOTE — Telephone Encounter (Signed)
Last office visit 11/16/2020 with Dr. Lorelei Pont for pain with urination.  Last refilled 10/07/2020 for #15 with 1 refill. CPE scheduled for 10/13/2021.

## 2020-11-23 LAB — HSV 1/2 AB (IGM), IFA W/RFLX TITER
HSV 1 IgM Screen: NEGATIVE
HSV 2 IgM Screen: NEGATIVE

## 2020-11-23 LAB — HSV(HERPES SIMPLEX VRS) I + II AB-IGG
HAV 1 IGG,TYPE SPECIFIC AB: 29.2 index — ABNORMAL HIGH
HSV 2 IGG,TYPE SPECIFIC AB: <0.9 index

## 2020-12-09 ENCOUNTER — Other Ambulatory Visit: Payer: Self-pay | Admitting: Neurosurgery

## 2020-12-09 DIAGNOSIS — M5416 Radiculopathy, lumbar region: Secondary | ICD-10-CM

## 2020-12-15 ENCOUNTER — Ambulatory Visit
Admission: RE | Admit: 2020-12-15 | Discharge: 2020-12-15 | Disposition: A | Discharge status: Home / Self Care | Payer: BC Managed Care – PPO | Source: Ambulatory Visit | Attending: Neurosurgery | Admitting: Neurosurgery

## 2020-12-15 ENCOUNTER — Other Ambulatory Visit: Payer: Self-pay

## 2020-12-15 DIAGNOSIS — M5416 Radiculopathy, lumbar region: Secondary | ICD-10-CM

## 2020-12-15 MED ORDER — IOPAMIDOL (ISOVUE-M 200) INJECTION 41%
1.0000 mL | Freq: Once | INTRAMUSCULAR | Status: AC
  Administered 2020-12-15: 1 mL via EPIDURAL

## 2020-12-15 MED ORDER — METHYLPREDNISOLONE ACETATE 40 MG/ML INJ SUSP (RADIOLOG
80.0000 mg | Freq: Once | INTRAMUSCULAR | Status: AC
  Administered 2020-12-15: 80 mg via EPIDURAL

## 2020-12-15 NOTE — Discharge Instructions (Signed)

## 2021-01-24 ENCOUNTER — Telehealth: Payer: Self-pay

## 2021-01-24 ENCOUNTER — Encounter (HOSPITAL_COMMUNITY): Payer: Self-pay | Admitting: Emergency Medicine

## 2021-01-24 ENCOUNTER — Other Ambulatory Visit: Payer: Self-pay

## 2021-01-24 ENCOUNTER — Emergency Department (HOSPITAL_COMMUNITY): Payer: BC Managed Care – PPO

## 2021-01-24 ENCOUNTER — Emergency Department (HOSPITAL_COMMUNITY)
Admission: EM | Admit: 2021-01-24 | Discharge: 2021-01-24 | Disposition: A | Discharge status: Home / Self Care | Payer: BC Managed Care – PPO | Attending: Emergency Medicine | Admitting: Emergency Medicine

## 2021-01-24 DIAGNOSIS — J4520 Mild intermittent asthma, uncomplicated: Secondary | ICD-10-CM | POA: Diagnosis not present

## 2021-01-24 DIAGNOSIS — I2510 Atherosclerotic heart disease of native coronary artery without angina pectoris: Secondary | ICD-10-CM | POA: Insufficient documentation

## 2021-01-24 DIAGNOSIS — I10 Essential (primary) hypertension: Secondary | ICD-10-CM | POA: Diagnosis not present

## 2021-01-24 DIAGNOSIS — R222 Localized swelling, mass and lump, trunk: Secondary | ICD-10-CM | POA: Insufficient documentation

## 2021-01-24 DIAGNOSIS — R079 Chest pain, unspecified: Secondary | ICD-10-CM | POA: Insufficient documentation

## 2021-01-24 DIAGNOSIS — Z7982 Long term (current) use of aspirin: Secondary | ICD-10-CM | POA: Insufficient documentation

## 2021-01-24 DIAGNOSIS — Z955 Presence of coronary angioplasty implant and graft: Secondary | ICD-10-CM | POA: Diagnosis not present

## 2021-01-24 DIAGNOSIS — Z79899 Other long term (current) drug therapy: Secondary | ICD-10-CM | POA: Insufficient documentation

## 2021-01-24 LAB — CBC WITH DIFFERENTIAL/PLATELET
Abs Immature Granulocytes: 0.01 10*3/uL (ref 0.00–0.07)
Basophils Absolute: 0 10*3/uL (ref 0.0–0.1)
Basophils Relative: 0 %
Eosinophils Absolute: 0.2 10*3/uL (ref 0.0–0.5)
Eosinophils Relative: 3 %
HCT: 45 % (ref 39.0–52.0)
Hemoglobin: 15.5 g/dL (ref 13.0–17.0)
Immature Granulocytes: 0 %
Lymphocytes Relative: 39 %
Lymphs Abs: 2.8 10*3/uL (ref 0.7–4.0)
MCH: 31.4 pg (ref 26.0–34.0)
MCHC: 34.4 g/dL (ref 30.0–36.0)
MCV: 91.1 fL (ref 80.0–100.0)
Monocytes Absolute: 0.7 10*3/uL (ref 0.1–1.0)
Monocytes Relative: 10 %
Neutro Abs: 3.4 10*3/uL (ref 1.7–7.7)
Neutrophils Relative %: 48 %
Platelets: 229 10*3/uL (ref 150–400)
RBC: 4.94 MIL/uL (ref 4.22–5.81)
RDW: 12.5 % (ref 11.5–15.5)
WBC: 7.1 10*3/uL (ref 4.0–10.5)
nRBC: 0 % (ref 0.0–0.2)

## 2021-01-24 LAB — BASIC METABOLIC PANEL
Anion gap: 9 (ref 5–15)
BUN: 9 mg/dL (ref 6–20)
CO2: 27 mmol/L (ref 22–32)
Calcium: 9.3 mg/dL (ref 8.9–10.3)
Chloride: 102 mmol/L (ref 98–111)
Creatinine, Ser: 1.18 mg/dL (ref 0.61–1.24)
GFR, Estimated: >60 mL/min (ref >60)
Glucose, Bld: 85 mg/dL (ref 70–99)
Potassium: 4.4 mmol/L (ref 3.5–5.1)
Sodium: 138 mmol/L (ref 135–145)

## 2021-01-24 LAB — TROPONIN I (HIGH SENSITIVITY): Troponin I (High Sensitivity): 5 ng/L (ref <18)

## 2021-01-24 IMAGING — DX DG CHEST 2V
2 series · 2 of 2 positions shown · non-contrast
Comparison: Chest radiograph dated [DATE].

CLINICAL DATA: Chest pain.

EXAM:
CHEST - 2 VIEW

[chest pa]
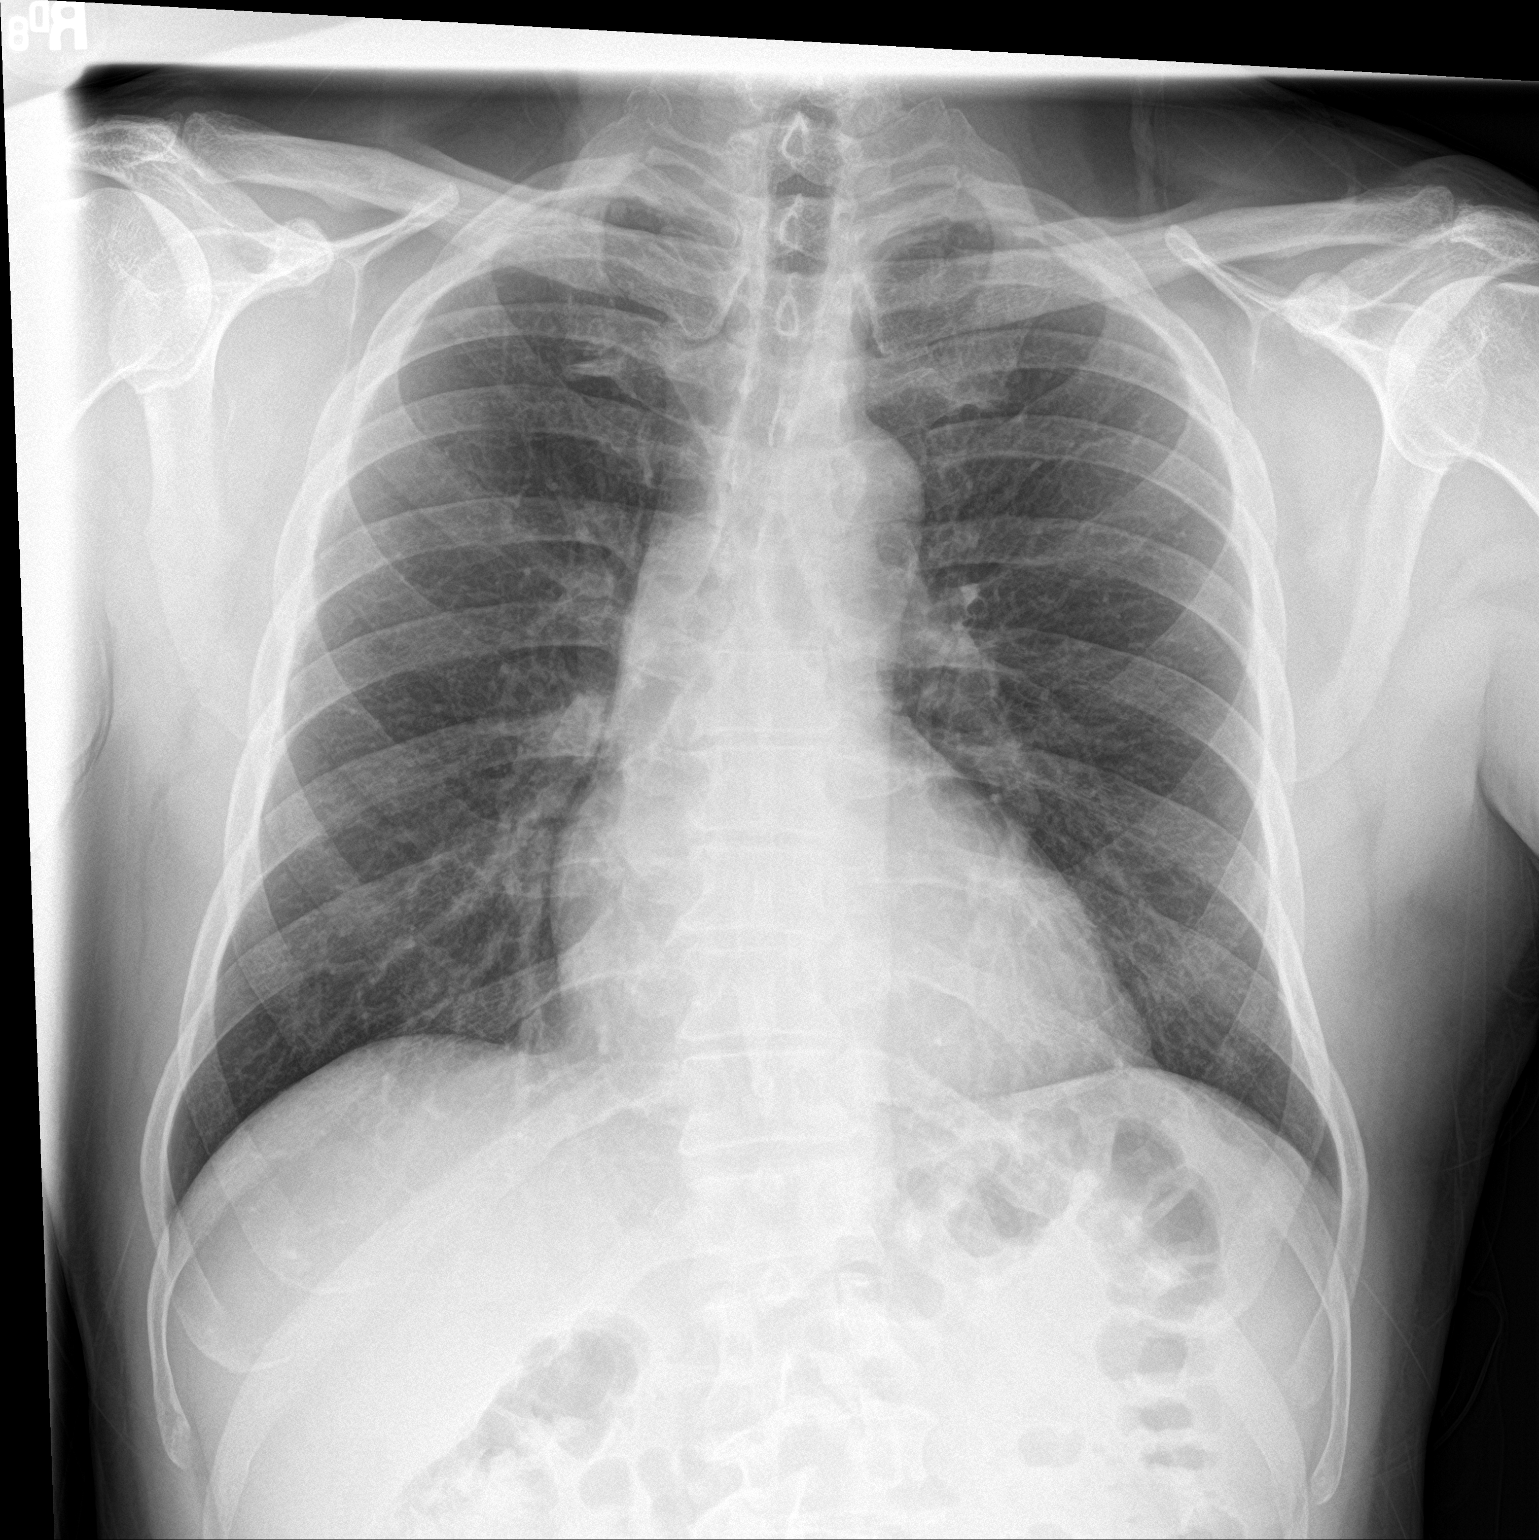

[chest lat]
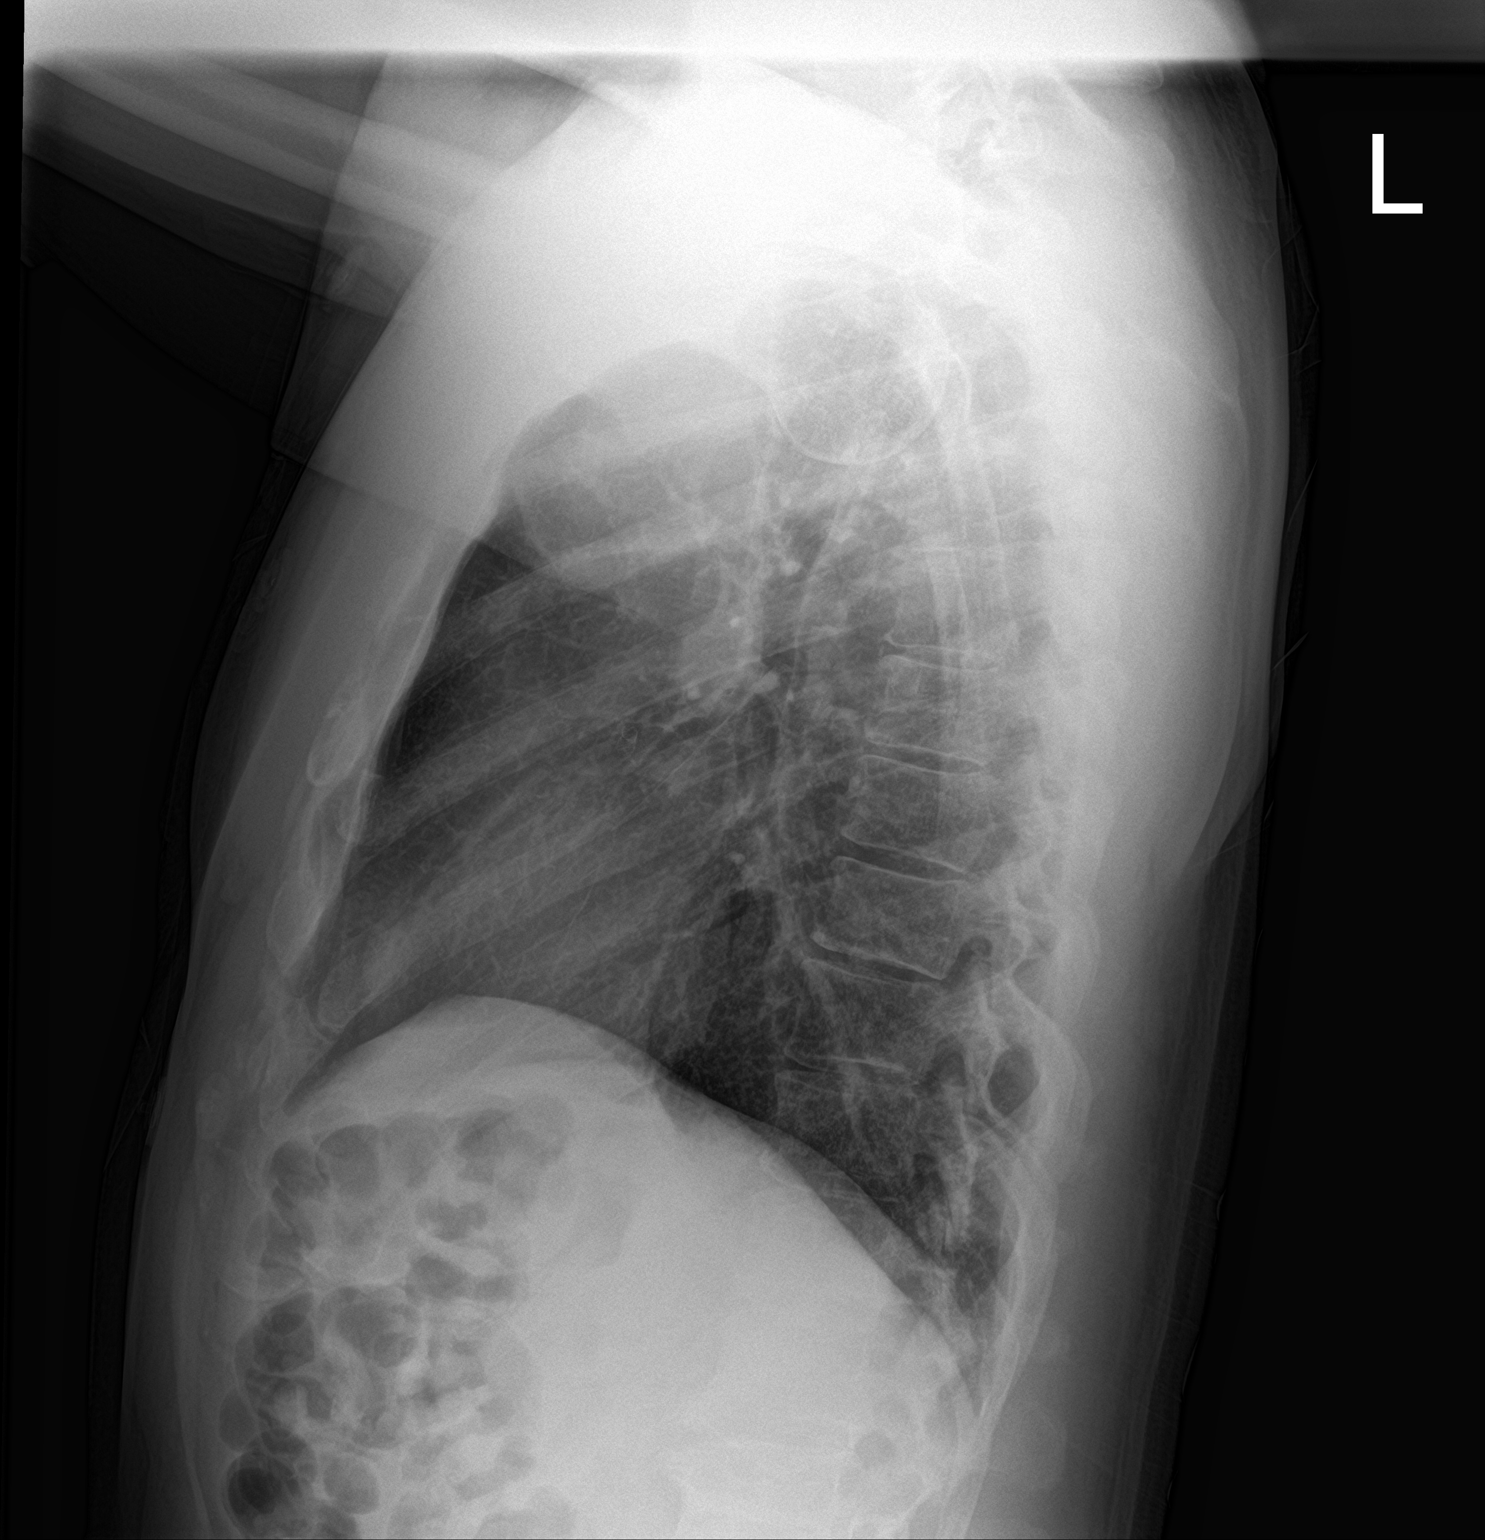

[2 of 2 positions shown; findings below may reference images not displayed]

FINDINGS: The heart size and mediastinal contours are within normal limits.
Both lungs are clear. The visualized skeletal structures are
unremarkable.
IMPRESSION: No active cardiopulmonary disease.

## 2021-01-24 MED ORDER — ACETAMINOPHEN 500 MG PO TABS
1000.0000 mg | ORAL_TABLET | Freq: Once | ORAL | Status: AC
  Administered 2021-01-24: 1000 mg via ORAL
  Filled 2021-01-24: qty 2

## 2021-01-24 NOTE — Telephone Encounter (Signed)
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 No CP at this time. BP at 7:30 AM with CP was 161/92 P?. Pt is having SOB and lightheadedness on and off but pt has MS. Pt is very fatigued and that has been building for several weeks but worse today ? Related to MS. Now BP 171/88 P 70; pt took metoprolol 25 mg this morning at 6 AM. Pt has been out of losartan 50 mg for 2 - 3 wks.pt also works out daily and noticed the bulging vein in chest to abd last night; still there no matter what position pt is in. Pt has never had bulging before.Pt has hx of heart attack. Pt said he will go to ED when he can get some things taken care of at work; pt is going to try to get someone in work early to cover for him. Pt cannot verify what time he is going to ED. ED precautions given and pt voiced understanding.sending note to Dr Diona Browner and Butch Penny CMA. Will also teams Butch Penny.

## 2021-01-24 NOTE — ED Provider Notes (Signed)
Emergency Medicine Provider Triage Evaluation Note  John Kelly , a 55 y.o. male  was evaluated in triage.  Pt complains of chest pain.  He states that last night he was laying in bed when he had onset of sharp central chest pain.  He states that he thought he was laying on a charging cord when he realized that there was not one under him he realized he was having chest pain.  He states that he had mild shortness of breath however he has multiple sclerosis and has intermittent shortness of breath.  Denies having nausea, vomiting or diaphoresis with episode.  He states that the episode abated however today had a recurrence of the sharp chest pain.  He also endorses feeling dizzy for the past 6 weeks which is increased in intensity over the past 2 days.  He states that he has been trying to get in with his neurologist but has not been able to make an appointment yet.  He does have a history of coronary stent placement.  Review of Systems  Positive: Chest pain shortness of breath Negative: Nausea, diaphoresis  Physical Exam  BP (!) 187/98 (BP Location: Right Arm)   Pulse 69   Temp 98.4 F (36.9 C) (Oral)   Resp 20   Ht 5' 7"  (1.702 m)   Wt 90 kg   SpO2 99%   BMI 31.08 kg/m  Gen:   Awake, no distress   Resp:  Normal effort, lungs clear to auscultation MSK:   Moves extremities without difficulty  Other:  Resting tremor.  S1/S2 without murmur.  No obvious wounds or trauma to the chest.  He complains of having a cordlike sensation in the left chest just below the breast.  I have tried to palpate where he is having pain in cannot appreciate any aberrancies.  Medical Decision Making  Medically screening exam initiated at 7:56 PM.  Appropriate orders placed.  Emilian Stawicki was informed that the remainder of the evaluation will be completed by another provider, this initial triage assessment does not replace that evaluation, and the importance of remaining in the ED until their evaluation is  complete.     Mickie Hillier, PA-C 01/24/21 2000    Godfrey Pick, MD 01/25/21 [phone]

## 2021-01-24 NOTE — Discharge Instructions (Signed)
Let your family doctor know about your visit here to the ED.  Please follow-up with them in the office.  Take Tylenol and ibuprofen or naproxen for your discomfort.  Please return for worsening symptoms symptoms that happen upon exercising fever.

## 2021-01-24 NOTE — ED Provider Notes (Signed)
[facility] EMERGENCY DEPARTMENT Provider Note   CSN: [id number] Arrival date & time: 01/24/21  1839     History Chief Complaint  Patient presents with   Chest Pain    John Kelly is a 55 y.o. male.  55 yo M with a chief complaints of left-sided chest pain.  This was noticed while he was lying prone on his bed yesterday evening.  He felt a palpable lump that could have went up into his chest from the upper portion of his abdomen.  He has noticed that its been there since.  Has tenderness whenever he palpates it.  Denies cough congestion or fever.  Has a history of MI but feels like that was different.  He denies history of PE or DVT.  Denies trauma.  Worse with lying on it palpating.  The history is provided by the patient.  Chest Pain Pain location:  L chest Pain quality: aching   Pain radiates to:  Does not radiate Pain severity:  Moderate Onset quality:  Gradual Duration:  2 days Timing:  Constant Progression:  Worsening Chronicity:  New Relieved by:  Nothing Worsened by:  Nothing Ineffective treatments:  None tried Associated symptoms: no abdominal pain, no fever, no headache, no palpitations, no shortness of breath and no vomiting       Past Medical History:  Diagnosis Date   Abnormal liver function test    Allergic rhinitis    Allergy    Anxiety    CAD (coronary artery disease) 09/01/2016   a.  09/01/16; NSTEMI LHC: 60-70% ostial LAD stenosis, 20% mid LAD, 30% RCA. EF 55-65%  b. 09/07/16 [place] s/p DES to oLAD   DDD (degenerative disc disease)    Depression    GERD (gastroesophageal reflux disease)    Hyperlipidemia    Hypertension    no meds   Insomnia    MS (multiple sclerosis) (HCC)    Myocardial infarction ([place])    2018   Neuromuscular disorder ([place])    MS   Ulcerative colitis    left sided    Patient Active Problem List   Diagnosis Date Noted   Acute diarrhea 05/16/2020   Left leg pain 04/28/2020   Cervical spinal stenosis  06/04/2019   High cholesterol 09/06/2016   CAD (coronary artery disease) 08/67/6195   Umbilical hernia 09/32/6712   Multiple sclerosis ([place]) 12/15/2014   DDD (degenerative disc disease), lumbar 09/22/2013   Chronic insomnia 09/22/2013   Family history of early CAD 09/22/2013   Depression, major, single episode, 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   Mild intermittent asthma 01/07/2009   Essential hypertension, benign 04/14/2007   ALLERGIC RHINITIS 04/14/2007   GERD 04/14/2007    Past Surgical History:  Procedure Laterality Date   COLONOSCOPY     CORONARY ANGIOGRAPHY N/A 09/07/2016   Procedure: Coronary Angiography;  Surgeon: Burnell Blanks, MD;  Location: [place] [facility];  Service: Cardiovascular;  Laterality: N/A;   CORONARY STENT INTERVENTION N/A 09/07/2016   Procedure: Coronary Stent Intervention;  Surgeon: Burnell Blanks, MD;  Location: [place] [facility];  Service: Cardiovascular;  Laterality: N/A;   Inguinal Herniorrhapy     Right and left   LEFT HEART CATH AND CORONARY ANGIOGRAPHY N/A 09/01/2016   Procedure: Left Heart Cath and Coronary Angiography;  Surgeon: Troy Sine, MD;  Location: [place] [facility];  Service: Cardiovascular;  Laterality: N/A;   LEFT HEART CATH AND CORONARY ANGIOGRAPHY  N/A 09/07/2016   Procedure: Left Heart Cath and Coronary Angiography;  Surgeon: Burnell Blanks, MD;  Location: [place] [facility];  Service: Cardiovascular;  Laterality: N/A;   PYLOROPLASTY         Family History  Problem Relation Age of Onset   Coronary artery disease Mother    Heart attack Mother    Hyperlipidemia Mother    Hypertension Mother    Colitis Mother    Diabetes Father    Hyperlipidemia Father    Hypertension Father    Coronary artery disease Father    Heart attack Father    Coronary artery disease Brother    Heart attack Brother 64       4 stents, smoker   Diabetes  Brother    Hypertension Brother    Hypertension Sister    Transient ischemic attack Sister    Colon cancer Neg Hx    Esophageal cancer Neg Hx    Stomach cancer Neg Hx    Rectal cancer Neg Hx     Social History   Tobacco Use   Smoking status: Never   Smokeless tobacco: Never  Vaping Use   Vaping Use: Never used  Substance Use Topics   Alcohol use: Yes    Alcohol/week: 0.0 standard drinks    Comment: rarely   Drug use: No    Home Medications Prior to Admission medications   Medication Sig Start Date End Date Taking? Authorizing Provider  zolpidem (AMBIEN) 5 MG tablet TAKE 1 TABLET BY MOUTH AT BEDTIME AS NEEDED FOR SLEEP 11/18/20   Bedsole, Amy E, MD  acetaminophen (TYLENOL) 500 MG tablet Take 1,000 mg by mouth daily as needed for moderate pain or headache.    [provider]  Ascorbic Acid (VITAMIN C) 1000 MG tablet Take 1,000 mg by mouth daily.    [provider]  aspirin 81 MG EC tablet Take 1 tablet (81 mg total) by mouth daily. 09/04/16   Daune Perch, NP  atorvastatin (LIPITOR) 40 MG tablet TAKE 1 TABLET BY MOUTH ONCE DAILY 09/02/20   Bedsole, Amy E, MD  balsalazide (COLAZAL) 750 MG capsule Take 3 capsules (2,250 mg total) by mouth 3 (three) times daily. 07/16/19   Ladene Artist, MD  Cholecalciferol (VITAMIN D3) 5000 units CAPS Take 5,000 Units by mouth daily.    [provider]  cyclobenzaprine (FLEXERIL) 5 MG tablet Take by mouth. 09/24/18   [provider]  diphenhydrAMINE (BENADRYL) 25 MG tablet Take 50 mg by mouth daily as needed for allergies.    [provider]  DiphenhydrAMINE HCl, Sleep, (ZZZQUIL PO) Take 1 Dose by mouth at bedtime as needed (sleep).    [provider]  esomeprazole (NEXIUM) 20 MG packet Take 20 mg by mouth daily.    [provider]  Glucosamine-Chondroitin 250-200 MG TABS Take by mouth.    [provider]  losartan (COZAAR) 50 MG tablet Take 1 tablet by mouth once daily 06/10/20    Diona Browner, Amy E, MD  mesalamine (CANASA) 1000 MG suppository INSERT 1 SUPPOSITORY RECTALLY ONCE DAILY 09/02/20   Ladene Artist, MD  metoprolol tartrate (LOPRESSOR) 25 MG tablet Take 1 tablet (25 mg total) by mouth 2 (two) times daily. 11/16/19   Troy Sine, MD  Misc Natural Products (OSTEO BI-FLEX TRIPLE STRENGTH PO) Take 2 tablets by mouth daily.    [provider]  nitroGLYCERIN (NITROSTAT) 0.4 MG SL tablet Place 1 tablet (0.4 mg total) under the tongue every  5 (five) minutes x 3 doses as needed for chest pain. 11/16/19   Troy Sine, MD  zinc gluconate 50 MG tablet Take 50 mg by mouth daily.    [provider]    Allergies    Diclofenac sodium, Skelaxin [metaxalone], and Trazodone and nefazodone  Review of Systems   Review of Systems  Constitutional:  Negative for chills and fever.  HENT:  Negative for congestion and facial swelling.   Eyes:  Negative for discharge and visual disturbance.  Respiratory:  Negative for shortness of breath.   Cardiovascular:  Positive for chest pain. Negative for palpitations.  Gastrointestinal:  Negative for abdominal pain, diarrhea and vomiting.  Musculoskeletal:  Negative for arthralgias and myalgias.  Skin:  Negative for color change and rash.  Neurological:  Negative for tremors, syncope and headaches.  Psychiatric/Behavioral:  Negative for confusion and dysphoric mood.    Physical Exam Updated Vital Signs BP (!) 184/98   Pulse 70   Temp 98.4 F (36.9 C) (Oral)   Resp 20   Ht 5' 7"  (1.702 m)   Wt 90 kg   SpO2 100%   BMI 31.08 kg/m   Physical Exam Vitals and nursing note reviewed.  Constitutional:      Appearance: He is well-developed.  HENT:     Head: Normocephalic and atraumatic.  Eyes:     Pupils: Pupils are equal, round, and reactive to light.  Neck:     Vascular: No JVD.  Cardiovascular:     Rate and Rhythm: Normal rate and regular rhythm.     Heart sounds: No murmur heard.   No friction rub. No  gallop.  Pulmonary:     Effort: No respiratory distress.     Breath sounds: No wheezing.  Chest:     Chest wall: Tenderness present.     Comments: Pain just underneath the rib margin and about rib 7 palpable painful nodule. Abdominal:     General: There is no distension.     Tenderness: There is no abdominal tenderness. There is no guarding or rebound.  Musculoskeletal:        General: Normal range of motion.     Cervical back: Normal range of motion and neck supple.  Skin:    Coloration: Skin is not pale.     Findings: No rash.  Neurological:     Mental Status: He is alert and oriented to person, place, and time.  Psychiatric:        Behavior: Behavior normal.    ED Results / Procedures / Treatments   Labs (all labs ordered are listed, but only abnormal results are displayed) Labs Reviewed  BASIC METABOLIC PANEL  CBC WITH DIFFERENTIAL/PLATELET  TROPONIN I (HIGH SENSITIVITY)    EKG EKG Interpretation  Date/Time:  Tuesday January 24 2021 19:37:10 EST Ventricular Rate:  71 PR Interval:  140 QRS Duration: 96 QT Interval:  380 QTC Calculation: 412 R Axis:   60 Text Interpretation: Normal sinus rhythm Normal ECG No significant change since last tracing Confirmed by Deno Etienne [phone]) on 01/24/2021 8:49:36 PM  Radiology DG Chest 2 View  Result Date: 01/24/2021 CLINICAL DATA:  Chest pain. EXAM: CHEST - 2 VIEW COMPARISON:  Chest radiograph dated 09/01/2016. FINDINGS: The heart size and mediastinal contours are within normal limits. Both lungs are clear. The visualized skeletal structures are unremarkable. IMPRESSION: No active cardiopulmonary disease. Electronically Signed   By: Anner Crete M.D.   On: 01/24/2021 20:31    Procedures Procedures  Medications Ordered in ED Medications  acetaminophen (TYLENOL) tablet 1,000 mg (has no administration in time range)    ED Course  I have reviewed the triage vital signs and the nursing notes.  Pertinent labs & imaging  results that were available during my care of the patient were reviewed by me and considered in my medical decision making (see chart for details).    MDM Rules/Calculators/A&P                           55 yo M with a chief complaints of palpable nodule that is painful to the rib margin on the left.  This was noticed yesterday when he was lying prone.  There is a small palpable tender area.  Could be a lymph node versus normal anatomy that sore from muscular strain.  Work-up ordered in triage   Troponin negative chest x-ray viewed by me without focal infiltrate no concerning finding on his cell count, no significant electrolyte abnormality.  Will discharge home.  PCP follow-up.  9:16 PM:  I have discussed the diagnosis/risks/treatment options with the patient and believe the pt to be eligible for discharge home to follow-up with PCP. We also discussed returning to the ED immediately if new or worsening sx occur. We discussed the sx which are most concerning (e.g., sudden worsening pain, fever, inability to tolerate by mouth, exertional symptoms) that necessitate immediate return. Medications administered to the patient during their visit and any new prescriptions provided to the patient are listed below.  Medications given during this visit Medications  acetaminophen (TYLENOL) tablet 1,000 mg (has no administration in time range)     The patient appears reasonably screen and/or stabilized for discharge and I doubt any other medical condition or other [facility] requiring further screening, evaluation, or treatment in the ED at this time prior to discharge.   Final Clinical Impression(s) / ED Diagnoses Final diagnoses:  Nonspecific chest pain  Nodule of left anterior chest wall    Rx / DC Orders ED Discharge Orders     None        Deno Etienne, DO 01/24/21 2116

## 2021-01-24 NOTE — Telephone Encounter (Signed)
Noted. Agree needs in person eval.. given CP, SOB progressive  and fatigue. ED appropriate.

## 2021-01-24 NOTE — ED Triage Notes (Signed)
Patient reports low sternal/epigastric pain with mild SOB onset last night , no emesis or diaphoresis , hitory of coronary stent and MS .

## 2021-01-24 NOTE — Telephone Encounter (Signed)
[place] [facility] - Client TELEPHONE ADVICE RECORD [facility] Patient Name: John Kelly DDY Gender: Male DOB: 1966/01/16 Age: 55 Y 2 M 16 D Return Phone Number: [phone] (Primary) Address: City/ State/ Zip: [place]  [address] Client [facility] - Client Client Site [place] - Day Physician Eliezer Lofts - MD Contact Type Call Who Is Calling Patient / Member / Family / Caregiver Call Type Triage / Clinical Relationship To Patient Self Return Phone Number [phone] (Primary) Chief Complaint BREATHING - shortness of breath or sounds breathless Reason for Call Symptomatic / Request for Health Information Initial Comment Call sent from office, no appts available. Caller states while laying on his stomach he felt like he was laying on something. He turned on his back and there is a bulging vein from his chest to abd. States he also has MS and has been dizzy and SOB. Translation No Nurse Assessment Nurse: Clovis Riley, RN, Georgina Peer Date/Time (Eastern Time): 01/24/2021 9:21:14 AM Confirm and document reason for call. If symptomatic, describe symptoms. ---Caller states while laying on his stomach he felt like he was laying on something. He turned on his back and there is a bulging vein from his chest to abdomen. States he also has MS and has been dizzy and SOB. States he had one sharp pain in his chest one hour ago. States the vein is 4-5 inches long and about the size of a phone charger cord. Does the patient have any new or worsening symptoms? ---Yes Will a triage be completed? ---Yes Related visit to physician within the last 2 weeks? ---No Does the PT have any chronic conditions? (i.e. diabetes, asthma, this includes High risk factors for pregnancy, etc.) ---Yes List chronic conditions. ---MS, hx of MI, bulging disc, UC Is this a behavioral health or substance abuse call? ---No PLEASE NOTE:  All timestamps contained within this report are represented as [place] Standard Time. CONFIDENTIALTY NOTICE: This fax transmission is intended only for the addressee. It contains information that is legally privileged, confidential or otherwise protected from use or disclosure. If you are not the intended recipient, you are strictly prohibited from reviewing, disclosing, copying using or disseminating any of this information or taking any action in reliance on or regarding this information. If you have received this fax in error, please notify us immediately by telephone so that we can arrange for its return to [place]. Phone: [phone], Toll-Free: [phone], Fax: [phone] Page: 2 of 2 Call Id: [id number] Guidelines Guideline Title Affirmed Question Affirmed Notes Nurse Date/Time Eilene Ghazi Time) Skin Lump or Localized Swelling [1] Swelling is painful to touch AND [2] no fever Clovis Riley, RNGeorgina Peer 01/24/2021 9:25:44 AM Disp. Time Eilene Ghazi Time) Disposition Final User 01/24/2021 9:17:38 AM Send to Urgent Queue Kathlynn Grate 01/24/2021 9:27:47 AM See PCP within 24 Hours Yes Clovis Riley, RN, Leilani Merl Disagree/Comply Comply Caller Understands Yes PreDisposition Did not know what to do Care Advice Given Per Guideline SEE PCP WITHIN 24 HOURS: * IF OFFICE WILL BE OPEN: You need to be examined within the next 24 hours. Call your doctor (or NP/PA) when the office opens and make an appointment. PAIN MEDICINES: * For pain relief, you can take either acetaminophen, ibuprofen, or naproxen. * They are over-the-counter (OTC) pain drugs. You can buy them at the drugstore. * ACETAMINOPHEN - REGULAR STRENGTH TYLENOL: Take 650 mg (two 325 mg pills) by mouth every 4 to 6 hours as needed. Each Regular Strength Tylenol pill has  325 mg of acetaminophen. The most you should take is 10 pills a day (3,250 mg total). Note: In [place], the maximum is 12 pills a day (3,900 mg total). * IBUPROFEN (E.G., MOTRIN, ADVIL): Take  400 mg (two 200 mg pills) by mouth every 6 hours. The most you should take is 6 pills a day (1,200 mg total). * Severe pain occurs * Fever occurs * You become worse CALL BACK IF: CARE ADVICE given per Skin Swelling or Lump (Adult) guideline. Referrals GO TO FACILITY UNDECIDE

## 2021-01-25 ENCOUNTER — Other Ambulatory Visit: Payer: Self-pay | Admitting: Family Medicine

## 2021-01-25 NOTE — Telephone Encounter (Signed)
Per appt notes pt has already scheduled in office appt with Dr Diona Browner on 01/27/21 at 3:20. Sending note to Dr Diona Browner and Butch Penny CMA.

## 2021-01-25 NOTE — Telephone Encounter (Signed)
[place] - Client TELEPHONE ADVICE RECORD [facility] Patient Name: John Kelly DDY Gender: Male DOB: 05-23-1965 Age: 55 Y 2 M 17 D Return Phone Number: [phone] (Primary) Address: City/ State/ Zip: [place]  [address] Client [facility] - Client Client Site [place] - Day Physician Eliezer Lofts - MD Contact Type Call Who Is Calling Patient / Member / Family / Caregiver Call Type Triage / Clinical Relationship To Patient Self Return Phone Number [phone] (Primary) Chief Complaint Unclassified Symptom Reason for Call Symptomatic / Request for [place] has painful vine. Translation No Nurse Assessment Nurse: Fredderick Phenix, RN, Lelan Pons Date/Time Eilene Ghazi Time): 01/25/2021 1:59:37 PM Confirm and document reason for call. If symptomatic, describe symptoms. ---Caller states he was seen in the ER last night for a nodule on the abdomen. Was told to follow up with the office and to take Tylenol for the pain. Does the patient have any new or worsening symptoms? ---Yes Will a triage be completed? ---Yes Related visit to physician within the last 2 weeks? ---Yes Does the PT have any chronic conditions? (i.e. diabetes, asthma, this includes High risk factors for pregnancy, etc.) ---Yes List chronic conditions. ---HTN, MS Is this a behavioral health or substance abuse call? ---No Guidelines Guideline Title Affirmed Question Affirmed Notes Nurse Date/Time (Eastern Time) Skin Lump or Localized Swelling [1] Swelling is painful to touch AND [2] no fever Stormy Card 01/25/2021 2:01:52 PM Disp. Time Eilene Ghazi Time) Disposition Final User 01/25/2021 1:05:07 PM Attempt made - message left Stormy Card 01/25/2021 2:03:02 PM See PCP within 24 Hours Yes Fredderick Phenix, RN, Karie Chimera NOTE: All timestamps contained within this report are represented as [place] Standard  Time. CONFIDENTIALTY NOTICE: This fax transmission is intended only for the addressee. It contains information that is legally privileged, confidential or otherwise protected from use or disclosure. If you are not the intended recipient, you are strictly prohibited from reviewing, disclosing, copying using or disseminating any of this information or taking any action in reliance on or regarding this information. If you have received this fax in error, please notify us immediately by telephone so that we can arrange for its return to [place]. Phone: [phone], Toll-Free: [phone], Fax: [phone] Page: 2 of 2 Call Id: [id number] [place] Disagree/Comply Comply Caller Understands Yes PreDisposition Did not know what to do Care Advice Given Per Guideline SEE PCP WITHIN 24 HOURS: * IF OFFICE WILL BE OPEN: You need to be examined within the next 24 hours. Call your doctor (or NP/PA) when the office opens and make an appointment. CALL BACK IF: CARE ADVICE given per Skin Swelling or Lump (Adult) guideline. Referrals REFERRED TO PCP OFFIC

## 2021-01-26 NOTE — Telephone Encounter (Signed)
Noted. Will see 01/27/21

## 2021-01-27 ENCOUNTER — Other Ambulatory Visit: Payer: Self-pay

## 2021-01-27 ENCOUNTER — Ambulatory Visit: Payer: BC Managed Care – PPO | Admitting: Family Medicine

## 2021-01-27 ENCOUNTER — Encounter: Payer: Self-pay | Admitting: Family Medicine

## 2021-01-27 VITALS — BP 132/62 | HR 74 | Temp 98.7°F | Ht 66.75 in | Wt 180.4 lb

## 2021-01-27 DIAGNOSIS — R0789 Other chest pain: Secondary | ICD-10-CM | POA: Insufficient documentation

## 2021-01-27 NOTE — Assessment & Plan Note (Signed)
Neg Cardiac eval in ER.   Likely muscle strain or irritation.  Treat with ice, chest wall stretches and ibuprofen as needed for pain and inflammation.  Hold upper body weight lifting until improving significantly or resolved.  Call if not improving over time, possibly 3-4 weeks.

## 2021-01-27 NOTE — Progress Notes (Signed)
Patient ID: John Kelly, male    DOB: [birth year], 55 y.o.   MRN: [id number]  This visit was conducted in person.  BP 132/62   Pulse 74   Temp 98.7 F (37.1 C) (Temporal)   Ht 5' 6.75" (1.695 m)   Wt 180 lb 6 oz (81.8 kg)   SpO2 97%   BMI 28.46 kg/m    CC: Chief Complaint  Patient presents with   Follow-up    ED Visit 01/24/2021    Subjective:   HPI: John Kelly is a 55 y.o. male presenting on 01/27/2021 for Follow-up (ED Visit 01/24/2021)   Reviewed ED visit on 11/8  for non specific chest pain and palpable lump in upper abd/ chest.  ER MD noted: "There is a small palpable tender area.  Could be a lymph node versus normal anatomy that sore from muscular strain.  Work-up ordered in triage   Troponin negative chest x-ray viewed by me without focal infiltrate no concerning finding on his cell count, no significant electrolyte abnormality."  NML EKG, labs and CXR.  Today he reports  the pain on 11/8 woke him from sleep.. sharp pain over the area of the lump.  Pain continued, improved but still sore, no change in size of lump. Notes lie on stomach and sit upright for prolonged periods.  No rash, no skin change. No fever.  No change in chest pain with food.  MS has been acting in last 6 weeks.. lightheaded, muscle spasms, neck ache.   Has been using tylenol.     Has been working out,lifting weight , doing ab exercises the day before felt the area. S/P femoral hernia surgeries.  Relevant past medical, surgical, family and social history reviewed and updated as indicated. Interim medical history since our last visit reviewed. Allergies and medications reviewed and updated. Outpatient Medications Prior to Visit  Medication Sig Dispense Refill   acetaminophen (TYLENOL) 500 MG tablet Take 1,000 mg by mouth daily as needed for moderate pain or headache.     Ascorbic Acid (VITAMIN C) 1000 MG tablet Take 1,000 mg by mouth daily.     aspirin 81 MG EC tablet Take 1 tablet (81  mg total) by mouth daily. 30 tablet 11   atorvastatin (LIPITOR) 40 MG tablet TAKE 1 TABLET BY MOUTH ONCE DAILY 90 tablet 0   balsalazide (COLAZAL) 750 MG capsule Take 3 capsules (2,250 mg total) by mouth 3 (three) times daily. 270 capsule 11   Cholecalciferol (VITAMIN D3) 5000 units CAPS Take 5,000 Units by mouth daily.     cyclobenzaprine (FLEXERIL) 5 MG tablet Take by mouth.     diphenhydrAMINE (BENADRYL) 25 MG tablet Take 50 mg by mouth daily as needed for allergies.     DiphenhydrAMINE HCl, Sleep, (ZZZQUIL PO) Take 1 Dose by mouth at bedtime as needed (sleep).     esomeprazole (NEXIUM) 20 MG packet Take 20 mg by mouth daily.     Glucosamine-Chondroitin 250-200 MG TABS Take by mouth.     losartan (COZAAR) 50 MG tablet Take 1 tablet by mouth once daily 90 tablet 1   mesalamine (CANASA) 1000 MG suppository INSERT 1 SUPPOSITORY RECTALLY ONCE DAILY 30 suppository 0   metoprolol tartrate (LOPRESSOR) 25 MG tablet Take 1 tablet (25 mg total) by mouth 2 (two) times daily. 180 tablet 3   Misc Natural Products (OSTEO BI-FLEX TRIPLE STRENGTH PO) Take 2 tablets by mouth daily.     nitroGLYCERIN (NITROSTAT) 0.4 MG SL tablet Place 1  tablet (0.4 mg total) under the tongue every 5 (five) minutes x 3 doses as needed for chest pain. 25 tablet 6   zinc gluconate 50 MG tablet Take 50 mg by mouth daily.     zolpidem (AMBIEN) 5 MG tablet TAKE 1 TABLET BY MOUTH AT BEDTIME AS NEEDED FOR SLEEP 30 tablet 1   No facility-administered medications prior to visit.     Per HPI unless specifically indicated in ROS section below Review of Systems  Constitutional:  Negative for fatigue and fever.  HENT:  Negative for ear pain.   Eyes:  Negative for pain.  Respiratory:  Negative for cough and shortness of breath.   Cardiovascular:  Positive for chest pain. Negative for palpitations and leg swelling.  Gastrointestinal:  Negative for abdominal pain.  Genitourinary:  Negative for dysuria.  Musculoskeletal:  Negative for  arthralgias.  Neurological:  Negative for syncope, light-headedness and headaches.  Psychiatric/Behavioral:  Negative for dysphoric mood.   Objective:  BP 132/62   Pulse 74   Temp 98.7 F (37.1 C) (Temporal)   Ht 5' 6.75" (1.695 m)   Wt 180 lb 6 oz (81.8 kg)   SpO2 97%   BMI 28.46 kg/m   Wt Readings from Last 3 Encounters:  01/27/21 180 lb 6 oz (81.8 kg)  01/24/21 198 lb 6.6 oz (90 kg)  11/16/20 178 lb (80.7 kg)      Physical Exam Constitutional:      Appearance: He is well-developed.  HENT:     Head: Normocephalic.     Right Ear: Hearing normal.     Left Ear: Hearing normal.     Nose: Nose normal.  Neck:     Thyroid: No thyroid mass or thyromegaly.     Vascular: No carotid bruit.     Trachea: Trachea normal.  Cardiovascular:     Rate and Rhythm: Normal rate and regular rhythm.     Pulses: Normal pulses.     Heart sounds: Heart sounds not distant. No murmur heard.   No friction rub. No gallop.     Comments: No peripheral edema Pulmonary:     Effort: Pulmonary effort is normal. No respiratory distress.     Breath sounds: Normal breath sounds.  Chest:       Comments: Slightly stringy tissue in area circle, no skin changes, slight increase in pain with arm movements and chest wall stretching. Musculoskeletal:     Right lower leg: No edema.     Left lower leg: No edema.  Skin:    General: Skin is warm and dry.     Findings: No rash.  Psychiatric:        Speech: Speech normal.        Behavior: Behavior normal.        Thought Content: Thought content normal.      Results for orders placed or performed during the hospital encounter of 16/01/09  Basic metabolic panel  Result Value Ref Range   Sodium 138 135 - 145 mmol/L   Potassium 4.4 3.5 - 5.1 mmol/L   Chloride 102 98 - 111 mmol/L   CO2 27 22 - 32 mmol/L   Glucose, Bld 85 70 - 99 mg/dL   BUN 9 6 - 20 mg/dL   Creatinine, Ser 1.18 0.61 - 1.24 mg/dL   Calcium 9.3 8.9 - 10.3 mg/dL   GFR, Estimated >60 >60  mL/min   Anion gap 9 5 - 15  CBC with Differential  Result Value Ref Range  WBC 7.1 4.0 - 10.5 K/uL   RBC 4.94 4.22 - 5.81 MIL/uL   Hemoglobin 15.5 13.0 - 17.0 g/dL   HCT 45.0 39.0 - 52.0 %   MCV 91.1 80.0 - 100.0 fL   MCH 31.4 26.0 - 34.0 pg   MCHC 34.4 30.0 - 36.0 g/dL   RDW 12.5 11.5 - 15.5 %   Platelets 229 150 - 400 K/uL   nRBC 0.0 0.0 - 0.2 %   Neutrophils Relative % 48 %   Neutro Abs 3.4 1.7 - 7.7 K/uL   Lymphocytes Relative 39 %   Lymphs Abs 2.8 0.7 - 4.0 K/uL   Monocytes Relative 10 %   Monocytes Absolute 0.7 0.1 - 1.0 K/uL   Eosinophils Relative 3 %   Eosinophils Absolute 0.2 0.0 - 0.5 K/uL   Basophils Relative 0 %   Basophils Absolute 0.0 0.0 - 0.1 K/uL   Immature Granulocytes 0 %   Abs Immature Granulocytes 0.01 0.00 - 0.07 K/uL  Troponin I (High Sensitivity)  Result Value Ref Range   Troponin I (High Sensitivity) 5 <18 ng/L    This visit occurred during the SARS-CoV-2 public health emergency.  Safety protocols were in place, including screening questions prior to the visit, additional usage of staff PPE, and extensive cleaning of exam room while observing appropriate contact time as indicated for disinfecting solutions.   COVID 19 screen:  No recent travel or known exposure to COVID19 The patient denies respiratory symptoms of COVID 19 at this time. The importance of social distancing was discussed today.   Assessment and Plan    Problem List Items Addressed This Visit     Chest wall pain - Primary    Neg Cardiac eval in ER.   Likely muscle strain or irritation.  Treat with ice, chest wall stretches and ibuprofen as needed for pain and inflammation.  Hold upper body weight lifting until improving significantly or resolved.  Call if not improving over time, possibly 3-4 weeks.        Eliezer Lofts, MD

## 2021-01-27 NOTE — Patient Instructions (Signed)
Likely muscle strain or irritation.  Treat with ice, chest wall stretches and ibuprofen as needed for pain and inflammation.  Hold upper body weight lifting until improving significantly or resolved.  Call if not improving over time, possibly 3-4 weeks.

## 2021-01-31 ENCOUNTER — Ambulatory Visit: Payer: BC Managed Care – PPO | Admitting: Family Medicine

## 2021-02-02 ENCOUNTER — Other Ambulatory Visit: Payer: Self-pay | Admitting: Family Medicine

## 2021-02-02 NOTE — Telephone Encounter (Signed)
Last office visit 01/27/21 for chest wall pain.  Last refilled 11/18/2020 for #30 with 1 refill.  CPE scheduled for 10/13/2021.

## 2021-03-16 ENCOUNTER — Other Ambulatory Visit: Payer: Self-pay | Admitting: Family Medicine

## 2021-03-16 NOTE — Telephone Encounter (Signed)
Last office visit 01/27/2021 for chest wall pain.  Last refilled 02/02/2021 for #30 with no refills.  CPE scheduled for 10/13/2021.

## 2021-04-12 ENCOUNTER — Other Ambulatory Visit: Payer: Self-pay | Admitting: Neurosurgery

## 2021-04-12 DIAGNOSIS — M5416 Radiculopathy, lumbar region: Secondary | ICD-10-CM

## 2021-04-17 ENCOUNTER — Other Ambulatory Visit: Payer: Self-pay

## 2021-04-17 ENCOUNTER — Ambulatory Visit
Admission: RE | Admit: 2021-04-17 | Discharge: 2021-04-17 | Disposition: A | Discharge status: Home / Self Care | Payer: BC Managed Care – PPO | Source: Ambulatory Visit | Attending: Neurosurgery | Admitting: Neurosurgery

## 2021-04-17 DIAGNOSIS — M5416 Radiculopathy, lumbar region: Secondary | ICD-10-CM

## 2021-04-17 MED ORDER — IOPAMIDOL (ISOVUE-M 200) INJECTION 41%
1.0000 mL | Freq: Once | INTRAMUSCULAR | Status: AC
  Administered 2021-04-17: 1 mL via EPIDURAL

## 2021-04-17 MED ORDER — METHYLPREDNISOLONE ACETATE 40 MG/ML INJ SUSP (RADIOLOG
80.0000 mg | Freq: Once | INTRAMUSCULAR | Status: AC
  Administered 2021-04-17: 80 mg via EPIDURAL

## 2021-04-17 NOTE — Discharge Instructions (Signed)

## 2021-04-27 ENCOUNTER — Other Ambulatory Visit: Payer: Self-pay | Admitting: Cardiovascular Disease

## 2021-05-10 ENCOUNTER — Other Ambulatory Visit: Payer: Self-pay | Admitting: Family Medicine

## 2021-05-10 NOTE — Telephone Encounter (Signed)
Last office visit 01/27/21 for chest wall pain.   Last refilled 03/21/2021 for #30 with no refills.  CPE scheduled 10/13/2021.

## 2021-06-06 ENCOUNTER — Encounter: Payer: Self-pay | Admitting: Family Medicine

## 2021-06-07 MED ORDER — ZOLPIDEM TARTRATE 5 MG PO TABS: 5.0000 mg | ORAL_TABLET | Freq: Every evening | ORAL | 0 refills | Status: DC | PRN

## 2021-06-26 ENCOUNTER — Telehealth: Payer: Self-pay

## 2021-06-26 ENCOUNTER — Ambulatory Visit: Payer: BC Managed Care – PPO | Admitting: Family

## 2021-06-26 NOTE — Telephone Encounter (Signed)
06/26/21 at 3:20 appt cancelled and sending note to T Dugal FNP and [facility]  CMA. ?

## 2021-06-26 NOTE — Telephone Encounter (Signed)
[place] Night - Client ?Nonclinical Telephone Record  ?AccessNurse? ?Client [place] Night - Client ?Client Site [place] ?Provider AA - PHYSICIAN, NOT LISTED- MD ?Contact Type Call ?Who Is Calling Patient / Member / Family / Caregiver ?Caller Name Euclid Cassetta ?Caller Phone Number [phone] ?Patient Name John Kelly ?Patient DOB [birth year] ?Call Type Message Only Information Provided ?Reason for Call Request to [facility] Appointment ?Initial Comment Caller wants to cancel his appt ?Patient request to speak to RN No ?Additional Comment + ?Disp. Time Disposition Final User ?06/25/2021 8:18:10 PM General Information Provided Yes Bryson Corona ?Call Closed By: Bryson Corona ?Transaction Date/Time: 06/25/2021 8:16:04 PM (ET ?

## 2021-07-28 ENCOUNTER — Other Ambulatory Visit: Payer: Self-pay | Admitting: Cardiovascular Disease

## 2021-08-28 NOTE — Progress Notes (Unsigned)
Cardiology Clinic Note   Patient Name: John Kelly Date of Encounter: 08/30/2021  Primary Care Provider:  Jinny Sanders, MD Primary Cardiologist:  Shelva Majestic, MD  Patient Profile    John Kelly 56 year old male presents to clinic today for follow-up evaluation of his essential hypertension and coronary artery disease.  Past Medical History    Past Medical History:  Diagnosis Date   Abnormal liver function test    Allergic rhinitis    Allergy    Anxiety    CAD (coronary artery disease) 09/01/2016   a.  09/01/16; NSTEMI LHC: 60-70% ostial LAD stenosis, 20% mid LAD, 30% RCA. EF 55-65%  b. 09/07/16 [place] s/p DES to oLAD   DDD (degenerative disc disease)    Depression    GERD (gastroesophageal reflux disease)    Hyperlipidemia    Hypertension    no meds   Insomnia    MS (multiple sclerosis) (HCC)    Myocardial infarction ([place])    2018   Neuromuscular disorder ([place])    MS   Ulcerative colitis    left sided   Past Surgical History:  Procedure Laterality Date   COLONOSCOPY     CORONARY ANGIOGRAPHY N/A 09/07/2016   Procedure: Coronary Angiography;  Surgeon: Burnell Blanks, MD;  Location: [facility];  Service: Cardiovascular;  Laterality: N/A;   CORONARY STENT INTERVENTION N/A 09/07/2016   Procedure: Coronary Stent Intervention;  Surgeon: Burnell Blanks, MD;  Location: [facility];  Service: Cardiovascular;  Laterality: N/A;   Inguinal Herniorrhapy     Right and left   LEFT HEART CATH AND CORONARY ANGIOGRAPHY N/A 09/01/2016   Procedure: Left Heart Cath and Coronary Angiography;  Surgeon: Troy Sine, MD;  Location: [facility];  Service: Cardiovascular;  Laterality: N/A;   LEFT HEART CATH AND CORONARY ANGIOGRAPHY N/A 09/07/2016   Procedure: Left Heart Cath and Coronary Angiography;  Surgeon: Burnell Blanks, MD;  Location: [facility];  Service: Cardiovascular;  Laterality: N/A;   PYLOROPLASTY       Allergies  Allergies  Allergen Reactions   Diclofenac Sodium Shortness Of Breath and Swelling    respiratory difficulty   Skelaxin [Metaxalone] Nausea And Vomiting   Trazodone And Nefazodone Other (See Comments)     Head felt funny     History of Present Illness    John Kelly has a PMH of essential hypertension, GERD, allergic rhinitis, mild asthma, multiple sclerosis, DDD, depression, chronic insomnia, umbilical hernia, chest wall pain, and coronary artery disease.  He had an NSTEMI in 2018.  He underwent cardiac catheterization which showed 60-70% ostial LAD, 20% mid LAD, 30% RCA lesion.  Medical management recommended.  He developed recurrent chest discomfort after discharge and returned to the Cath Lab on 09/07/2016 where he received DES to his LAD.  He was noted to have slight plaque shift into his ostium of his left circumflex.  Nuclear stress test 12/28/2016 showed an EF of 55% and normal perfusion.  He was seen by Dr. Claiborne Billings 8/21.  During that time his blood pressure was elevated and his metoprolol was increased to 25 mg twice daily.  He was seen in follow-up by Almyra Deforest, PA-C on 03/15/2020.  During that time he was noted to have blood pressure that ranged in the 517-001 systolic range.  He was not sure if his losartan has been increased.  He was asked to review his medication bottles at home.  If he was taking  50 mg it was recommended that he increase to 75 mg.  If he was taking 75 mg it was recommended that he increase to 100 mg.  Follow-up lab work was planned.  He did note that he took a single dose of nitroglycerin and noted symptom relief.  He did not seek medical attention.  He did not report any further episodes of back or chest discomfort.  His EKG showed T wave inversion in lead III with no significant ischemic changes.  He presented to the emergency department on 01/24/2021.  He was noted to have nonspecific chest discomfort.  He reported when he would lay prone he would  notice chest discomfort.  The reported a tender, palpable lump that was on the top portion of his abdomen.  He was noted to have a palpable painful nodule underneath rib 7.  EKG showed normal sinus rhythm with no significant changes and no ST elevation 71 bpm  He presents to the clinic today for follow-up evaluation states he feels well.  He continues to exercise at the gym 5 to 6 days/week.  He was diagnosed with MS around 7 years ago.  He is not currently on medications for MS but follows with [facility] [place] specialist.  He reports that after being in the emergency department for evaluation of his chest discomfort in November he had resolution of his symptoms about 1 week later.  He has not had any further episodes of chest discomfort.  He wonders if his chest discomfort was related to muscle strain.  We reviewed his lab work and diagnostics from his ED visit.  He expressed understanding.  I will refill his atorvastatin and nitroglycerin.  We will plan follow-up in 1 year.  Today he denies chest pain, shortness of breath, lower extremity edema, fatigue, palpitations, melena, hematuria, hemoptysis, diaphoresis, weakness, presyncope, syncope, orthopnea, and PND.   Home Medications    Prior to Admission medications   Medication Sig Start Date End Date Taking? Authorizing Provider  acetaminophen (TYLENOL) 500 MG tablet Take 1,000 mg by mouth daily as needed for moderate pain or headache.    [provider]  Ascorbic Acid (VITAMIN C) 1000 MG tablet Take 1,000 mg by mouth daily.    [provider]  aspirin 81 MG EC tablet Take 1 tablet (81 mg total) by mouth daily. 09/04/16   Daune Perch, NP  atorvastatin (LIPITOR) 40 MG tablet TAKE 1 TABLET BY MOUTH ONCE DAILY 09/02/20   Bedsole, Amy E, MD  balsalazide (COLAZAL) 750 MG capsule Take 3 capsules (2,250 mg total) by mouth 3 (three) times daily. 07/16/19   Ladene Artist, MD  Cholecalciferol (VITAMIN D3) 5000 units CAPS Take 5,000  Units by mouth daily.    [provider]  cyclobenzaprine (FLEXERIL) 5 MG tablet Take by mouth. 09/24/18   [provider]  diphenhydrAMINE (BENADRYL) 25 MG tablet Take 50 mg by mouth daily as needed for allergies.    [provider]  DiphenhydrAMINE HCl, Sleep, (ZZZQUIL PO) Take 1 Dose by mouth at bedtime as needed (sleep).    [provider]  esomeprazole (NEXIUM) 20 MG packet Take 20 mg by mouth daily.    [provider]  Glucosamine-Chondroitin 250-200 MG TABS Take by mouth.    [provider]  losartan (COZAAR) 50 MG tablet Take 1 tablet by mouth once daily 01/25/21   Bedsole, Amy E, MD  mesalamine (CANASA) 1000 MG suppository INSERT 1 SUPPOSITORY RECTALLY ONCE DAILY 09/02/20   Fuller Plan,  Pricilla Riffle, MD  metoprolol tartrate (LOPRESSOR) 25 MG tablet Take 1 tablet (25 mg total) by mouth 2 (two) times daily. Keep scheduled appointment for further refills 07/31/21   Troy Sine, MD  Misc Natural Products (OSTEO BI-FLEX TRIPLE STRENGTH PO) Take 2 tablets by mouth daily.    [provider]  nitroGLYCERIN (NITROSTAT) 0.4 MG SL tablet Place 1 tablet (0.4 mg total) under the tongue every 5 (five) minutes x 3 doses as needed for chest pain. 11/16/19   Troy Sine, MD  zinc gluconate 50 MG tablet Take 50 mg by mouth daily.    [provider]  zolpidem (AMBIEN) 5 MG tablet Take 1-2 tablets (5-10 mg total) by mouth at bedtime as needed. for sleep 06/07/21   Jinny Sanders, MD    Family History    Family History  Problem Relation Age of Onset   Coronary artery disease Mother    Heart attack Mother    Hyperlipidemia Mother    Hypertension Mother    Colitis Mother    Diabetes Father    Hyperlipidemia Father    Hypertension Father    Coronary artery disease Father    Heart attack Father    Coronary artery disease Brother    Heart attack Brother 71       4 stents, smoker   Diabetes Brother    Hypertension Brother    Hypertension  Sister    Transient ischemic attack Sister    Colon cancer Neg Hx    Esophageal cancer Neg Hx    Stomach cancer Neg Hx    Rectal cancer Neg Hx    He indicated that his mother is deceased. He indicated that his father is deceased. He indicated that his sister is alive. He indicated that his brother is alive. He indicated that the status of his neg hx is unknown.  Social History    Social History   Socioeconomic History   Marital status: Divorced    Spouse name: Not on file   Number of children: 2   Years of education: Not on file   Highest education level: Not on file  Occupational History   Occupation: Steel distribution    Employer: [employer]  Tobacco Use   Smoking status: Never   Smokeless tobacco: Never  Vaping Use   Vaping Use: Never used  Substance and Sexual Activity   Alcohol use: Yes    Alcohol/week: 0.0 standard drinks of alcohol    Comment: rarely   Drug use: No   Sexual activity: Not Currently  Other Topics Concern   Not on file  Social History Narrative   HSG   Divorced, not sexually active   1 son- '92, 1 daughter - '97   Work: Lobbyist- shipping/ rec'ing; quality management   Patient has never smoked   Alcohol use- rare 1-2 a month   Illicit Drug use- no    Patient gets regular exercise.   Social Determinants of Health   Financial Resource Strain: Not on file  Food Insecurity: Not on file  Transportation Needs: Not on file  Physical Activity: Not on file  Stress: Not on file  Social Connections: Not on file  Intimate Partner Violence: Not on file     Review of Systems    General:  No chills, fever, night sweats or weight changes.  Cardiovascular:  No chest pain, dyspnea on exertion, edema, orthopnea, palpitations, paroxysmal nocturnal dyspnea. Dermatological: No rash, lesions/masses Respiratory: No cough,  dyspnea Urologic: No hematuria, dysuria Abdominal:   No nausea, vomiting, diarrhea, bright red blood per rectum, melena,  or hematemesis Neurologic:  No visual changes, wkns, changes in mental status. All other systems reviewed and are otherwise negative except as noted above.  Physical Exam    VS:  BP 126/68   Pulse 67   Ht 5' 7"  (1.702 m)   Wt 175 lb 12.8 oz (79.7 kg)   SpO2 100%   BMI 27.53 kg/m  , BMI Body mass index is 27.53 kg/m. GEN: Well nourished, well developed, in no acute distress. HEENT: normal. Neck: Supple, no JVD, carotid bruits, or masses. Cardiac: RRR, no murmurs, rubs, or gallops. No clubbing, cyanosis, edema.  Radials/DP/PT 2+ and equal bilaterally.  Respiratory:  Respirations regular and unlabored, clear to auscultation bilaterally. GI: Soft, nontender, nondistended, BS + x 4. MS: no deformity or atrophy. Skin: warm and dry, no rash. Neuro:  Strength and sensation are intact. Psych: Normal affect.  Accessory Clinical Findings    Recent Labs: 01/24/2021: BUN 9; Creatinine, Ser 1.18; Hemoglobin 15.5; Platelets 229; Potassium 4.4; Sodium 138   Recent Lipid Panel    Component Value Date/Time   CHOL 114 07/08/2020 0807   CHOL 135 11/19/2019 0853   TRIG 72.0 07/08/2020 0807   HDL 37.70 (L) 07/08/2020 0807   HDL 36 (L) 11/19/2019 0853   CHOLHDL 3 07/08/2020 0807   VLDL 14.4 07/08/2020 0807   LDLCALC 62 07/08/2020 0807   LDLCALC 77 11/19/2019 0853   LDLDIRECT 103.0 06/01/2019 0738    ECG personally reviewed by me today-normal sinus rhythm possible left atrial enlargement LVH possible lateral infarct undetermined age 93 bpm- No acute changes  Echocardiogram 09/29/2013 Study Conclusions   - Left ventricle: The cavity size was normal. Systolic function was    normal. The estimated ejection fraction was in the range of 55%    to 60%. Wall motion was normal; there were no regional wall    motion abnormalities. Left ventricular diastolic function    parameters were normal.  - Aortic valve: There was trivial regurgitation.  - Mitral valve: There was mild  regurgitation.  Assessment & Plan   1.  Atypical chest pain-no chest pain today.  Reports a mobile tender nodule at rib 7.   Refer to PCP for further evaluation No plans for ischemic evaluation  Coronary artery disease-no recent episodes of exertional pain or nitroglycerin use.  Cardiac catheterization 09/07/2016 with PCI and DES to his LAD.  Stress testing 12/28/2016 showed an EF of 55% with normal perfusion. Continue aspirin, atorvastatin, losartan, metoprolol, nitroglycerin Heart healthy low-sodium diet-salty 6 given Increase physical activity as tolerated  Essential hypertension-BP today 126/68.  Well-controlled at home. Continue losartan, metoprolol Heart healthy low-sodium diet-salty 6 given Increase physical activity as tolerated  Hyperlipidemia-LDL 62 07/08/20 Continue atorvastatin, aspirin Heart healthy low-sodium high-fiber diet Increase physical activity as tolerated Follows with PCP  Disposition: Follow-up with Dr. Claiborne Billings or me in 12 months.   Jossie Ng. Mirelle Biskup NP-C    08/30/2021, 4:01 PM [facility] [address] Office [phone] Fax [phone]  Notice: This dictation was prepared with Dragon dictation along with smaller phrase technology. Any transcriptional errors that result from this process are unintentional and may not be corrected upon review.  I spent 14 minutes examining this patient, reviewing medications, and using patient centered shared decision making involving her cardiac care.  Prior to her visit I spent greater than 20 minutes reviewing her  past medical history,  medications, and prior cardiac tests.

## 2021-08-30 ENCOUNTER — Encounter: Payer: Self-pay | Admitting: General Practice

## 2021-08-30 ENCOUNTER — Ambulatory Visit (INDEPENDENT_AMBULATORY_CARE_PROVIDER_SITE_OTHER): Payer: BC Managed Care – PPO | Admitting: General Practice

## 2021-08-30 DIAGNOSIS — R079 Chest pain, unspecified: Secondary | ICD-10-CM | POA: Diagnosis not present

## 2021-08-30 DIAGNOSIS — E785 Hyperlipidemia, unspecified: Secondary | ICD-10-CM

## 2021-08-30 DIAGNOSIS — I25119 Atherosclerotic heart disease of native coronary artery with unspecified angina pectoris: Secondary | ICD-10-CM | POA: Diagnosis not present

## 2021-08-30 DIAGNOSIS — I10 Essential (primary) hypertension: Secondary | ICD-10-CM

## 2021-08-30 MED ORDER — ATORVASTATIN CALCIUM 40 MG PO TABS: 40.0000 mg | ORAL_TABLET | Freq: Every day | ORAL | 3 refills | Status: DC

## 2021-08-30 MED ORDER — NITROGLYCERIN 0.4 MG SL SUBL
0.4000 mg | SUBLINGUAL_TABLET | SUBLINGUAL | 6 refills | Status: AC | PRN
Start: 2021-08-30 — End: ?

## 2021-08-30 NOTE — Patient Instructions (Signed)
Medication Instructions:  The current medical regimen is effective;  continue present plan and medications as directed. Please refer to the Current Medication list given to you today.   *If you need a refill on your cardiac medications before your next appointment, please call your pharmacy*  Lab Work:   Testing/Procedures:  NONE    NONE If you have labs (blood work) drawn today and your tests are completely normal, you will receive your results only by: [place] (if you have MyChart) OR  A paper copy in the mail If you have any lab test that is abnormal or we need to change your treatment, we will call you to review the results.  Follow-Up: Your next appointment:  12 month(s) In Person with Shelva Majestic, MD   Please call our office 2 months in advance to schedule this appointment   At [facility], you and your health needs are our priority.  As part of our continuing mission to provide you with exceptional heart care, we have created designated Provider Care Teams.  These Care Teams include your primary Cardiologist (physician) and Advanced Practice Providers (APPs -  Physician Assistants and Nurse Practitioners) who all work together to provide you with the care you need, when you need it.   Important Information About Sugar

## 2021-09-07 ENCOUNTER — Other Ambulatory Visit: Payer: Self-pay | Admitting: Neurosurgery

## 2021-09-07 DIAGNOSIS — M5416 Radiculopathy, lumbar region: Secondary | ICD-10-CM

## 2021-09-14 ENCOUNTER — Other Ambulatory Visit: Payer: Self-pay | Admitting: Neurosurgery

## 2021-09-14 ENCOUNTER — Ambulatory Visit
Admission: RE | Admit: 2021-09-14 | Discharge: 2021-09-14 | Disposition: A | Discharge status: Home / Self Care | Payer: BC Managed Care – PPO | Source: Ambulatory Visit | Attending: Neurosurgery | Admitting: Neurosurgery

## 2021-09-14 DIAGNOSIS — M5416 Radiculopathy, lumbar region: Secondary | ICD-10-CM

## 2021-09-14 MED ORDER — IOPAMIDOL (ISOVUE-M 200) INJECTION 41%
1.0000 mL | Freq: Once | INTRAMUSCULAR | Status: AC
  Administered 2021-09-14: 1 mL via EPIDURAL

## 2021-09-14 MED ORDER — METHYLPREDNISOLONE ACETATE 40 MG/ML INJ SUSP (RADIOLOG
80.0000 mg | Freq: Once | INTRAMUSCULAR | Status: AC
  Administered 2021-09-14: 80 mg via EPIDURAL

## 2021-09-14 NOTE — Discharge Instructions (Signed)

## 2021-09-27 ENCOUNTER — Telehealth: Payer: Self-pay | Admitting: Family Medicine

## 2021-09-27 DIAGNOSIS — Z125 Encounter for screening for malignant neoplasm of prostate: Secondary | ICD-10-CM

## 2021-09-27 DIAGNOSIS — E7800 Pure hypercholesterolemia, unspecified: Secondary | ICD-10-CM

## 2021-09-27 NOTE — Telephone Encounter (Signed)
-----   Message from Ellamae Sia sent at 09/20/2021 11:52 AM EDT ----- Regarding: Lab orders for Friday, 7.21.23 Patient is scheduled for CPX labs, please order future labs, Thanks , Karna Christmas

## 2021-10-02 ENCOUNTER — Other Ambulatory Visit: Payer: Self-pay

## 2021-10-02 ENCOUNTER — Telehealth: Payer: Self-pay | Admitting: Gastroenterology

## 2021-10-02 DIAGNOSIS — B839 Helminthiasis, unspecified: Secondary | ICD-10-CM

## 2021-10-02 NOTE — Telephone Encounter (Signed)
More likely this is mucous in his stool. Send O&P x 3 to evaluate for parasites.  He is overdue for ulcerative colitis follow up, which needs to be at least annually, so please keep the first available APP appt.

## 2021-10-02 NOTE — Telephone Encounter (Signed)
Spoke with the patient. He agrees to this plan of care and will come to the lab tomorrow. Offered him an appointment first available with the last APP he has seen. He has a jury duty summons and is concerned he may not be able to keep the appointment. Scheduled in first available in September with Dr Fuller Plan.

## 2021-10-02 NOTE — Telephone Encounter (Signed)
Patient last seen by [name], PA 03/21/2020 for rectal pain. Diagnosed with rectal abscess.   He calls today with concerns about seeing worms in stool on more than 1 occasion. Today he has developed diarrhea and abdominal discomfort. He does eat pork and recently fresh fish in [place]. Afebrile. No nausea or vomiting. First APP appointment is several weeks off.  Please advise.

## 2021-10-06 ENCOUNTER — Other Ambulatory Visit (INDEPENDENT_AMBULATORY_CARE_PROVIDER_SITE_OTHER): Payer: BC Managed Care – PPO

## 2021-10-06 ENCOUNTER — Other Ambulatory Visit: Payer: BC Managed Care – PPO

## 2021-10-06 DIAGNOSIS — Z125 Encounter for screening for malignant neoplasm of prostate: Secondary | ICD-10-CM | POA: Diagnosis not present

## 2021-10-06 DIAGNOSIS — E7800 Pure hypercholesterolemia, unspecified: Secondary | ICD-10-CM

## 2021-10-06 DIAGNOSIS — B839 Helminthiasis, unspecified: Secondary | ICD-10-CM

## 2021-10-06 LAB — COMPREHENSIVE METABOLIC PANEL
ALT: 18 U/L (ref 0–53)
AST: 20 U/L (ref 0–37)
Albumin: 4.2 g/dL (ref 3.5–5.2)
Alkaline Phosphatase: 58 U/L (ref 39–117)
BUN: 12 mg/dL (ref 6–23)
CO2: 30 mEq/L (ref 19–32)
Calcium: 8.9 mg/dL (ref 8.4–10.5)
Chloride: 104 mEq/L (ref 96–112)
Creatinine, Ser: 1.24 mg/dL (ref 0.40–1.50)
GFR: 64.81 mL/min (ref >60.00)
Glucose, Bld: 96 mg/dL (ref 70–99)
Potassium: 4.7 mEq/L (ref 3.5–5.1)
Sodium: 138 mEq/L (ref 135–145)
Total Bilirubin: 0.6 mg/dL (ref 0.2–1.2)
Total Protein: 6.4 g/dL (ref 6.0–8.3)

## 2021-10-06 LAB — LIPID PANEL
Cholesterol: 157 mg/dL (ref 0–200)
HDL: 29.9 mg/dL — ABNORMAL LOW (ref >39.00)
LDL Cholesterol: 104 mg/dL — ABNORMAL HIGH (ref 0–99)
NonHDL: 126.99
Total CHOL/HDL Ratio: 5
Triglycerides: 115 mg/dL (ref 0.0–149.0)
VLDL: 23 mg/dL (ref 0.0–40.0)

## 2021-10-06 LAB — PSA: PSA: 0.72 ng/mL (ref 0.10–4.00)

## 2021-10-09 NOTE — Progress Notes (Signed)
No critical labs need to be addressed urgently. We will discuss labs in detail at upcoming office visit.   

## 2021-10-11 LAB — OVA AND PARASITE EXAMINATION

## 2021-10-13 ENCOUNTER — Encounter: Payer: Self-pay | Admitting: Family Medicine

## 2021-10-13 ENCOUNTER — Ambulatory Visit (INDEPENDENT_AMBULATORY_CARE_PROVIDER_SITE_OTHER): Payer: BC Managed Care – PPO | Admitting: Family Medicine

## 2021-10-13 VITALS — BP 130/62 | HR 69 | Temp 98.7°F | Ht 67.0 in | Wt 175.4 lb

## 2021-10-13 DIAGNOSIS — F325 Major depressive disorder, single episode, in full remission: Secondary | ICD-10-CM

## 2021-10-13 DIAGNOSIS — Z0000 Encounter for general adult medical examination without abnormal findings: Secondary | ICD-10-CM | POA: Diagnosis not present

## 2021-10-13 DIAGNOSIS — G35 Multiple sclerosis: Secondary | ICD-10-CM

## 2021-10-13 DIAGNOSIS — I10 Essential (primary) hypertension: Secondary | ICD-10-CM | POA: Diagnosis not present

## 2021-10-13 DIAGNOSIS — K51319 Ulcerative (chronic) rectosigmoiditis with unspecified complications: Secondary | ICD-10-CM | POA: Diagnosis not present

## 2021-10-13 DIAGNOSIS — E7800 Pure hypercholesterolemia, unspecified: Secondary | ICD-10-CM

## 2021-10-13 DIAGNOSIS — F5104 Psychophysiologic insomnia: Secondary | ICD-10-CM

## 2021-10-13 NOTE — Assessment & Plan Note (Signed)
Chronic, stable control on Ambien 5 to 10 mg p.o. nightly

## 2021-10-13 NOTE — Assessment & Plan Note (Addendum)
Stable, chronic.  Continue current medication.  losartan 50 mg daily

## 2021-10-13 NOTE — Assessment & Plan Note (Signed)
Chronic, in remission on no medication

## 2021-10-13 NOTE — Patient Instructions (Addendum)
Restart atorvastatin 40 mg daily as planned.  Keep up with healthy lifestyle and regular exercise.

## 2021-10-13 NOTE — Assessment & Plan Note (Signed)
Chronic, stable control followed by neurology

## 2021-10-13 NOTE — Progress Notes (Signed)
Patient ID: John Kelly, male    DOB: [birth year], 56 y.o.   MRN: [id number]  This visit was conducted in person.  BP 130/62   Pulse 69   Temp 98.7 F (37.1 C) (Oral)   Ht 5' 7"  (1.702 m)   Wt 175 lb 6 oz (79.5 kg)   SpO2 97%   BMI 27.47 kg/m    CC:  Chief Complaint  Patient presents with   Annual Exam    Subjective:   HPI: John Kelly is a 56 y.o. male presenting on 10/13/2021 for Annual Exam  Hypertension:    Good control on losartan 50 mg daily BP Readings from Last 3 Encounters:  10/13/21 130/62  09/14/21 (!) 147/71  08/30/21 126/68  Using medication without problems or lightheadedness:   occ possible more from the MS. Chest pain with exertion:none Edema:none Short of breath: none Average home BPs: Other issues:   Elevated Cholesterol: LDL  no longer at goal off atorvastatin 40 mg daily... not sure why stopped. Lab Results  Component Value Date   CHOL 157 10/06/2021   HDL 29.90 (L) 10/06/2021   LDLCALC 104 (H) 10/06/2021   LDLDIRECT 103.0 06/01/2019   TRIG 115.0 10/06/2021   CHOLHDL 5 10/06/2021  Using medications without problems: Muscle aches:  Diet compliance: Exercise: Other complaints: CAD followed by Dr, Claiborne Billings  Chronic insomnia, MDD  Multiple Sclerosis: followed by neuro  Dr. Manuella Ghazi Mild disease course. On no chronic meds.   Ulcerative colitis followed by GI. On Canasa and balsalazide.  Relevant past medical, surgical, family and social history reviewed and updated as indicated. Interim medical history since our last visit reviewed. Allergies and medications reviewed and updated. Outpatient Medications Prior to Visit  Medication Sig Dispense Refill   acetaminophen (TYLENOL) 500 MG tablet Take 1,000 mg by mouth daily as needed for moderate pain or headache.     Ascorbic Acid (VITAMIN C) 1000 MG tablet Take 1,000 mg by mouth daily.     aspirin 81 MG EC tablet Take 1 tablet (81 mg total) by mouth daily. 30 tablet 11   atorvastatin (LIPITOR)  40 MG tablet Take 1 tablet (40 mg total) by mouth daily. 90 tablet 3   balsalazide (COLAZAL) 750 MG capsule Take 3 capsules (2,250 mg total) by mouth 3 (three) times daily. 270 capsule 11   Cholecalciferol (VITAMIN D3) 5000 units CAPS Take 5,000 Units by mouth daily.     diphenhydrAMINE (BENADRYL) 25 MG tablet Take 50 mg by mouth daily as needed for allergies.     DiphenhydrAMINE HCl, Sleep, (ZZZQUIL PO) Take 1 Dose by mouth at bedtime as needed (sleep).     esomeprazole (NEXIUM) 20 MG packet Take 20 mg by mouth daily.     mesalamine (CANASA) 1000 MG suppository INSERT 1 SUPPOSITORY RECTALLY ONCE DAILY 30 suppository 0   metoprolol tartrate (LOPRESSOR) 25 MG tablet Take 1 tablet (25 mg total) by mouth 2 (two) times daily. Keep scheduled appointment for further refills 60 tablet 0   Misc Natural Products (OSTEO BI-FLEX TRIPLE STRENGTH PO) Take 2 tablets by mouth daily.     nitroGLYCERIN (NITROSTAT) 0.4 MG SL tablet Place 1 tablet (0.4 mg total) under the tongue every 5 (five) minutes x 3 doses as needed for chest pain. 25 tablet 6   Nutritional Supplements (NUTRITIONAL SUPPLEMENT PO) Take by mouth. Test Max Boost- 1 tablet by mouth three times a day     zolpidem (AMBIEN) 5 MG tablet Take 1-2 tablets (5-10 mg  total) by mouth at bedtime as needed. for sleep 60 tablet 0   No facility-administered medications prior to visit.     Per HPI unless specifically indicated in ROS section below Review of Systems  Constitutional:  Negative for fatigue and fever.  HENT:  Negative for ear pain.   Eyes:  Negative for pain.  Respiratory:  Negative for cough and shortness of breath.   Cardiovascular:  Negative for chest pain, palpitations and leg swelling.  Gastrointestinal:  Negative for abdominal pain.  Genitourinary:  Negative for dysuria.  Musculoskeletal:  Negative for arthralgias.  Neurological:  Negative for syncope, light-headedness and headaches.  Psychiatric/Behavioral:  Negative for dysphoric  mood.    Objective:  BP 130/62   Pulse 69   Temp 98.7 F (37.1 C) (Oral)   Ht 5' 7"  (1.702 m)   Wt 175 lb 6 oz (79.5 kg)   SpO2 97%   BMI 27.47 kg/m   Wt Readings from Last 3 Encounters:  10/13/21 175 lb 6 oz (79.5 kg)  08/30/21 175 lb 12.8 oz (79.7 kg)  01/27/21 180 lb 6 oz (81.8 kg)      Physical Exam Constitutional:      Appearance: He is well-developed.  HENT:     Head: Normocephalic.     Right Ear: Hearing normal.     Left Ear: Hearing normal.     Nose: Nose normal.  Neck:     Thyroid: No thyroid mass or thyromegaly.     Vascular: No carotid bruit.     Trachea: Trachea normal.  Cardiovascular:     Rate and Rhythm: Normal rate and regular rhythm.     Pulses: Normal pulses.     Heart sounds: Heart sounds not distant. No murmur heard.    No friction rub. No gallop.     Comments: No peripheral edema Pulmonary:     Effort: Pulmonary effort is normal. No respiratory distress.     Breath sounds: Normal breath sounds.  Skin:    General: Skin is warm and dry.     Findings: No rash.  Psychiatric:        Speech: Speech normal.        Behavior: Behavior normal.        Thought Content: Thought content normal.       Results for orders placed or performed in visit on 10/06/21  Ova and parasite examination   Specimen: Stool   Stool  Result Value Ref Range   OVA + PARASITE EXAM Final report    O&P result 1 Comment   Ova and parasite examination   Specimen: Stool   Stool  Result Value Ref Range   OVA + PARASITE EXAM Final report    O&P result 1 Comment   Ova and parasite examination   Specimen: Stool   Stool x3  Result Value Ref Range   OVA + PARASITE EXAM Final report    O&P result 1 Comment      COVID 19 screen:  No recent travel or known exposure to COVID19 The patient denies respiratory symptoms of COVID 19 at this time. The importance of social distancing was discussed today.   Assessment and Plan   The patient's preventative maintenance and  recommended screening tests for an annual wellness exam were reviewed in full today. Brought up to date unless services declined.  Counselled on the importance of diet, exercise, and its role in overall health and mortality. The patient's FH and SH was reviewed, including their  home life, tobacco status, and drug and alcohol status.   Colonoscopy per GI q 3-5 years, last 06/2019 Vaccines:  uptodate with td, refused flu in past. COVID19 vaccine  x 1 had significant side effects,  consider shingrix  STD screen  not indicated.  ETOH/drugs: rare/none  nonsmoker Lab Results  Component Value Date   PSA 0.72 10/06/2021   PSA 0.99 07/08/2020   PSA 0.81 04/10/2016   Problem List Items Addressed This Visit     Chronic insomnia    Chronic, stable control on Ambien 5 to 10 mg p.o. nightly      Depression, major, single episode, complete remission (HCC)    Chronic, in remission on no medication      Essential hypertension, benign    Stable, chronic.  Continue current medication.  losartan 50 mg daily        High cholesterol    Chronic, no longer at goal off of atorvastatin.  He will restart atorvastatin 40 mg daily.      Multiple sclerosis (HCC)    Chronic, stable control followed by neurology      Ulcerative proctosigmoiditis with complication (HCC)    Chronic, stable followed by GI          Eliezer Lofts, MD

## 2021-10-13 NOTE — Assessment & Plan Note (Signed)
Chronic, no longer at goal off of atorvastatin.  He will restart atorvastatin 40 mg daily.

## 2021-10-13 NOTE — Assessment & Plan Note (Signed)
Chronic, stable followed by GI

## 2021-10-25 ENCOUNTER — Other Ambulatory Visit: Payer: Self-pay | Admitting: Cardiovascular Disease

## 2021-10-26 ENCOUNTER — Telehealth: Payer: Self-pay

## 2021-10-26 MED ORDER — LOSARTAN POTASSIUM 50 MG PO TABS: 50.0000 mg | ORAL_TABLET | Freq: Every day | ORAL | 3 refills | Status: DC

## 2021-10-26 NOTE — Telephone Encounter (Signed)
Refill sent as requested. 

## 2021-10-26 NOTE — Telephone Encounter (Signed)
MEDICATION:losartan (COZAAR) 50 MG tablet [[id number]]  DISCONTINUED  PHARMACY: [facility] [address], [place] [address]  Comments:   **Let patient know to contact pharmacy at the end of the day to make sure medication is ready. **  ** Please notify patient to allow 48-72 hours to process**  **Encourage patient to contact the pharmacy for refills or they can request refills through [facility]**

## 2021-11-16 ENCOUNTER — Other Ambulatory Visit: Payer: Self-pay | Admitting: Cardiovascular Disease

## 2021-11-24 ENCOUNTER — Ambulatory Visit: Payer: BC Managed Care – PPO | Admitting: Gastroenterology

## 2021-12-19 ENCOUNTER — Encounter: Payer: Self-pay | Admitting: Family Medicine

## 2021-12-19 ENCOUNTER — Ambulatory Visit: Payer: BC Managed Care – PPO | Admitting: Family Medicine

## 2021-12-19 VITALS — BP 120/74 | HR 69 | Temp 97.9°F | Ht 67.0 in | Wt 176.1 lb

## 2021-12-19 DIAGNOSIS — R55 Syncope and collapse: Secondary | ICD-10-CM

## 2021-12-19 DIAGNOSIS — G35 Multiple sclerosis: Secondary | ICD-10-CM | POA: Diagnosis not present

## 2021-12-19 NOTE — Assessment & Plan Note (Signed)
Acute worsening of chronic issue Orthostatics in office were negative but patient does not get adequate hydration.  Recommended increasing fluids. Will evaluate for possible secondary causes of lightheadedness with CBC, thyroid, vitamin evaluation and electrolyte tests. If evaluation is negative his symptoms are most likely secondary to MS and he will follow-up with neurologist.

## 2021-12-19 NOTE — Progress Notes (Signed)
Patient ID: John Kelly, male    DOB: 1965-03-24, 56 y.o.   MRN: [id number]  This visit was conducted in person.  BP 120/74   Pulse 69   Temp 97.9 F (36.6 C) (Oral)   Ht 5' 7"  (1.702 m)   Wt 176 lb 2 oz (79.9 kg)   SpO2 98%   BMI 27.59 kg/m    CC:  Chief Complaint  Patient presents with   Dizziness    Has gotten worse over the last 3 weeks    Subjective:   HPI: John Kelly is a 56 y.o. male  with history of multiple sclerosis, ulcerative colitis, HTN and MDD presenting on 12/19/2021 for Dizziness (Has gotten worse over the last 3 weeks)   He reports  dizziness, presyncope. Has had intermittently for years.  Worse in last 3 weeks.  Has been working 11-12. Hrs a day.  Then working out.  Sleeping well off and on.   Notes when working out and going sitting to standing.   Occ SOB, more  tired than usual.  No bleeding, no blood in stool.  Slight chest pain today at rest, non exertional. No heart racing. No recent med changes.     24 oz of water a day.  BP Readings from Last 3 Encounters:  12/19/21 120/74  10/13/21 130/62  09/14/21 (!) 147/71     Relevant past medical, surgical, family and social history reviewed and updated as indicated. Interim medical history since our last visit reviewed. Allergies and medications reviewed and updated. Outpatient Medications Prior to Visit  Medication Sig Dispense Refill   acetaminophen (TYLENOL) 500 MG tablet Take 1,000 mg by mouth daily as needed for moderate pain or headache.     Ascorbic Acid (VITAMIN C) 1000 MG tablet Take 1,000 mg by mouth daily.     aspirin 81 MG EC tablet Take 1 tablet (81 mg total) by mouth daily. 30 tablet 11   balsalazide (COLAZAL) 750 MG capsule Take 3 capsules (2,250 mg total) by mouth 3 (three) times daily. 270 capsule 11   Cholecalciferol (VITAMIN D3) 5000 units CAPS Take 5,000 Units by mouth daily.     diphenhydrAMINE (BENADRYL) 25 MG tablet Take 50 mg by mouth daily as needed for  allergies.     DiphenhydrAMINE HCl, Sleep, (ZZZQUIL PO) Take 1 Dose by mouth at bedtime as needed (sleep).     esomeprazole (NEXIUM) 20 MG packet Take 20 mg by mouth daily.     losartan (COZAAR) 50 MG tablet Take 1 tablet (50 mg total) by mouth daily. 90 tablet 3   mesalamine (CANASA) 1000 MG suppository INSERT 1 SUPPOSITORY RECTALLY ONCE DAILY 30 suppository 0   metoprolol tartrate (LOPRESSOR) 25 MG tablet Take 1 tablet by mouth twice daily 90 tablet 3   Misc Natural Products (OSTEO BI-FLEX TRIPLE STRENGTH PO) Take 2 tablets by mouth daily.     nitroGLYCERIN (NITROSTAT) 0.4 MG SL tablet Place 1 tablet (0.4 mg total) under the tongue every 5 (five) minutes x 3 doses as needed for chest pain. 25 tablet 6   Nutritional Supplements (NUTRITIONAL SUPPLEMENT PO) Take by mouth. Test Max Boost- 1 tablet by mouth three times a day     zolpidem (AMBIEN) 5 MG tablet Take 1-2 tablets (5-10 mg total) by mouth at bedtime as needed. for sleep 60 tablet 0   atorvastatin (LIPITOR) 40 MG tablet Take 1 tablet (40 mg total) by mouth daily. 90 tablet 3   No facility-administered medications prior  to visit.     Per HPI unless specifically indicated in ROS section below Review of Systems  Constitutional:  Negative for fatigue and fever.  HENT:  Negative for ear pain.   Eyes:  Negative for pain.  Respiratory:  Negative for cough and shortness of breath.   Cardiovascular:  Negative for chest pain, palpitations and leg swelling.  Gastrointestinal:  Negative for abdominal pain.  Genitourinary:  Negative for dysuria.  Musculoskeletal:  Negative for arthralgias.  Neurological:  Negative for syncope, light-headedness and headaches.  Psychiatric/Behavioral:  Negative for dysphoric mood.    Objective:  BP 120/74   Pulse 69   Temp 97.9 F (36.6 C) (Oral)   Ht 5' 7"  (1.702 m)   Wt 176 lb 2 oz (79.9 kg)   SpO2 98%   BMI 27.59 kg/m   Wt Readings from Last 3 Encounters:  12/19/21 176 lb 2 oz (79.9 kg)  10/13/21  175 lb 6 oz (79.5 kg)  08/30/21 175 lb 12.8 oz (79.7 kg)      Physical Exam Constitutional:      Appearance: He is well-developed.  HENT:     Head: Normocephalic.     Right Ear: Hearing normal.     Left Ear: Hearing normal.     Nose: Nose normal.  Neck:     Thyroid: No thyroid mass or thyromegaly.     Vascular: No carotid bruit.     Trachea: Trachea normal.  Cardiovascular:     Rate and Rhythm: Normal rate and regular rhythm.     Pulses: Normal pulses.     Heart sounds: Heart sounds not distant. No murmur heard.    No friction rub. No gallop.     Comments: No peripheral edema Pulmonary:     Effort: Pulmonary effort is normal. No respiratory distress.     Breath sounds: Normal breath sounds.  Skin:    General: Skin is warm and dry.     Findings: No rash.  Psychiatric:        Speech: Speech normal.        Behavior: Behavior normal.        Thought Content: Thought content normal.       Results for orders placed or performed in visit on 10/06/21  Ova and parasite examination   Specimen: Stool   Stool  Result Value Ref Range   OVA + PARASITE EXAM Final report    O&P result 1 Comment   Ova and parasite examination   Specimen: Stool   Stool  Result Value Ref Range   OVA + PARASITE EXAM Final report    O&P result 1 Comment   Ova and parasite examination   Specimen: Stool   Stool x3  Result Value Ref Range   OVA + PARASITE EXAM Final report    O&P result 1 Comment      COVID 19 screen:  No recent travel or known exposure to COVID19 The patient denies respiratory symptoms of COVID 19 at this time. The importance of social distancing was discussed today.   Assessment and Plan Problem List Items Addressed This Visit     Multiple sclerosis ([place])   Pre-syncope - Primary    Acute worsening of chronic issue Orthostatics in office were negative but patient does not get adequate hydration.  Recommended increasing fluids. Will evaluate for possible secondary causes of  lightheadedness with CBC, thyroid, vitamin evaluation and electrolyte tests. If evaluation is negative his symptoms are most likely secondary to MS  and he will follow-up with neurologist.      Relevant Orders   CBC with Differential/Platelet   Comprehensive metabolic panel   VITAMIN D 25 Hydroxy (Vit-D Deficiency, Fractures)   TSH   Vitamin B12   Orders Placed This Encounter  Procedures   CBC with Differential/Platelet   Comprehensive metabolic panel   VITAMIN D 25 Hydroxy (Vit-D Deficiency, Fractures)   TSH   Vitamin B12       Eliezer Lofts, MD

## 2021-12-19 NOTE — Patient Instructions (Signed)
Increase water.  Avoid benadryl and sleep medication as could be sedating/causing dizziness.  Please stop at the lab to have labs drawn.

## 2021-12-20 LAB — CBC WITH DIFFERENTIAL/PLATELET
Basophils Absolute: 0 10*3/uL (ref 0.0–0.1)
Basophils Relative: 0.8 % (ref 0.0–3.0)
Eosinophils Absolute: 0 10*3/uL (ref 0.0–0.7)
Eosinophils Relative: 0.2 % (ref 0.0–5.0)
HCT: 39.1 % (ref 39.0–52.0)
Hemoglobin: 13.5 g/dL (ref 13.0–17.0)
Lymphocytes Relative: 45 % (ref 12.0–46.0)
Lymphs Abs: 2 10*3/uL (ref 0.7–4.0)
MCHC: 34.5 g/dL (ref 30.0–36.0)
MCV: 91.4 fl (ref 78.0–100.0)
Monocytes Absolute: 0.5 10*3/uL (ref 0.1–1.0)
Monocytes Relative: 10.6 % (ref 3.0–12.0)
Neutro Abs: 1.9 10*3/uL (ref 1.4–7.7)
Neutrophils Relative %: 43.4 % (ref 43.0–77.0)
Platelets: 195 10*3/uL (ref 150.0–400.0)
RBC: 4.28 Mil/uL (ref 4.22–5.81)
RDW: 13.1 % (ref 11.5–15.5)
WBC: 4.3 10*3/uL (ref 4.0–10.5)

## 2021-12-20 LAB — COMPREHENSIVE METABOLIC PANEL
ALT: 27 U/L (ref 0–53)
AST: 26 U/L (ref 0–37)
Albumin: 4.3 g/dL (ref 3.5–5.2)
Alkaline Phosphatase: 58 U/L (ref 39–117)
BUN: 14 mg/dL (ref 6–23)
CO2: 30 mEq/L (ref 19–32)
Calcium: 9 mg/dL (ref 8.4–10.5)
Chloride: 104 mEq/L (ref 96–112)
Creatinine, Ser: 1.25 mg/dL (ref 0.40–1.50)
GFR: 64.1 mL/min (ref >60.00)
Glucose, Bld: 86 mg/dL (ref 70–99)
Potassium: 4.4 mEq/L (ref 3.5–5.1)
Sodium: 138 mEq/L (ref 135–145)
Total Bilirubin: 0.5 mg/dL (ref 0.2–1.2)
Total Protein: 6.8 g/dL (ref 6.0–8.3)

## 2021-12-20 LAB — VITAMIN B12: Vitamin B-12: 503 pg/mL (ref 211–911)

## 2021-12-20 LAB — VITAMIN D 25 HYDROXY (VIT D DEFICIENCY, FRACTURES): VITD: 79.17 ng/mL (ref 30.00–100.00)

## 2021-12-20 LAB — TSH: TSH: 3.25 u[IU]/mL (ref 0.35–5.50)

## 2021-12-29 ENCOUNTER — Other Ambulatory Visit: Payer: Self-pay | Admitting: Neurology

## 2021-12-29 DIAGNOSIS — F5104 Psychophysiologic insomnia: Secondary | ICD-10-CM

## 2021-12-29 DIAGNOSIS — G35 Multiple sclerosis: Secondary | ICD-10-CM

## 2021-12-29 DIAGNOSIS — Z79899 Other long term (current) drug therapy: Secondary | ICD-10-CM

## 2022-01-02 ENCOUNTER — Telehealth: Payer: Self-pay

## 2022-01-02 NOTE — Telephone Encounter (Signed)
-----   Message from Meade Maw, MD sent at 01/01/2022  9:18 PM EDT ----- Regarding: RE: Sure. We'll work on it.   Ardyth Gal ----- Message ----- From: Deetta Perla, MD Sent: 01/01/2022   9:16 PM EDT To: Meade Maw, MD; Berdine Addison, RN  Are you all able to get this patient into the PA clinic. He is someone I saw years ago at [facility] clinic for back and leg pain. He reached back out to me but prefers not to come to [facility].

## 2022-01-02 NOTE — Telephone Encounter (Signed)
Scheduled appt for 11/9 and added to the wait list.

## 2022-01-03 ENCOUNTER — Other Ambulatory Visit: Payer: Self-pay | Admitting: Neurology

## 2022-01-03 DIAGNOSIS — Z79899 Other long term (current) drug therapy: Secondary | ICD-10-CM

## 2022-01-03 DIAGNOSIS — G35 Multiple sclerosis: Secondary | ICD-10-CM

## 2022-01-03 DIAGNOSIS — F5104 Psychophysiologic insomnia: Secondary | ICD-10-CM

## 2022-01-09 ENCOUNTER — Other Ambulatory Visit: Payer: Self-pay

## 2022-01-09 ENCOUNTER — Ambulatory Visit
Admission: RE | Admit: 2022-01-09 | Discharge: 2022-01-09 | Disposition: A | Discharge status: Home / Self Care | Payer: Self-pay | Source: Ambulatory Visit | Attending: Orthopedic Surgery | Admitting: Orthopedic Surgery

## 2022-01-09 DIAGNOSIS — Z049 Encounter for examination and observation for unspecified reason: Secondary | ICD-10-CM

## 2022-01-10 ENCOUNTER — Ambulatory Visit
Admission: RE | Admit: 2022-01-10 | Discharge: 2022-01-10 | Disposition: A | Discharge status: Home / Self Care | Payer: Self-pay | Source: Ambulatory Visit | Attending: Orthopedic Surgery | Admitting: Orthopedic Surgery

## 2022-01-10 DIAGNOSIS — Z049 Encounter for examination and observation for unspecified reason: Secondary | ICD-10-CM

## 2022-01-10 NOTE — Progress Notes (Unsigned)
Referring Physician:  Jinny Sanders, MD [address] [place],  [place] [address]  Primary Physician:  John Sanders, MD  History of Present Illness: 01/10/2022 [name] has seen Dr. Lacinda Kelly in the past. He was sent to [facility] imaging for lumbar ESI back in June. MRI from April 2021 showed spinal stenosis at L4-L5 and L5-S1. He had recent flex/ext lumbar xrays in June as well.    Reviewed with [facility], do not need to get updated lumbar MRI from their standpoint. ***    Duration: *** Location: *** Quality: *** Severity: ***  Precipitating: aggravated by *** Modifying factors: made better by *** Weakness: none Timing: *** Bowel/Bladder Dysfunction: none  Conservative measures:  Physical therapy: ***  Multimodal medical therapy including regular antiinflammatories: ***  Injections:  L4-L5 IL ESI on 09/14/21 L4-L5 IL ESI on 04/17/21 L4-L5 IL ESI on 12/15/20 L4-L5 IL ESI on 06/30/21 L4-L5 IL ESI on 01/25/20 L4-L5 IL ESI on 10/07/19 L4-L5 IL ESI on 07/21/19  Past Surgery: ***  John Kelly has ***no symptoms of cervical myelopathy.  The symptoms are causing a significant impact on the patient's life.   Review of Systems:  A 10 point review of systems is negative, except for the pertinent positives and negatives detailed in the HPI.  Past Medical History: Past Medical History:  Diagnosis Date   Abnormal liver function test    Allergic rhinitis    Allergy    Anxiety    CAD (coronary artery disease) 09/01/2016   a.  09/01/16; NSTEMI LHC: 60-70% ostial LAD stenosis, 20% mid LAD, 30% RCA. EF 55-65%  b. 09/07/16 [place] s/p DES to oLAD   DDD (degenerative disc disease)    Depression    GERD (gastroesophageal reflux disease)    Hyperlipidemia    Hypertension    no meds   Insomnia    MS (multiple sclerosis) (HCC)    Myocardial infarction ([place])    2018   Neuromuscular disorder ([place])    MS   Ulcerative colitis    left sided    Past Surgical  History: Past Surgical History:  Procedure Laterality Date   COLONOSCOPY     CORONARY ANGIOGRAPHY N/A 09/07/2016   Procedure: Coronary Angiography;  Surgeon: Burnell Blanks, MD;  Location: [facility];  Service: Cardiovascular;  Laterality: N/A;   CORONARY STENT INTERVENTION N/A 09/07/2016   Procedure: Coronary Stent Intervention;  Surgeon: Burnell Blanks, MD;  Location: [facility];  Service: Cardiovascular;  Laterality: N/A;   Inguinal Herniorrhapy     Right and left   LEFT HEART CATH AND CORONARY ANGIOGRAPHY N/A 09/01/2016   Procedure: Left Heart Cath and Coronary Angiography;  Surgeon: Troy Sine, MD;  Location: [facility];  Service: Cardiovascular;  Laterality: N/A;   LEFT HEART CATH AND CORONARY ANGIOGRAPHY N/A 09/07/2016   Procedure: Left Heart Cath and Coronary Angiography;  Surgeon: Burnell Blanks, MD;  Location: [facility];  Service: Cardiovascular;  Laterality: N/A;   PYLOROPLASTY      Allergies: Allergies as of 01/11/2022 - Review Complete 12/19/2021  Allergen Reaction Noted   Diclofenac sodium Shortness Of Breath and Swelling    Skelaxin [metaxalone] Nausea And Vomiting 01/17/2010   Trazodone and nefazodone Other (See Comments) 09/06/2016    Medications: Outpatient Encounter Medications as of 01/11/2022  Medication Sig   acetaminophen (TYLENOL) 500 MG tablet Take 1,000 mg by mouth daily as needed for moderate pain or  headache.   Ascorbic Acid (VITAMIN C) 1000 MG tablet Take 1,000 mg by mouth daily.   aspirin 81 MG EC tablet Take 1 tablet (81 mg total) by mouth daily.   balsalazide (COLAZAL) 750 MG capsule Take 3 capsules (2,250 mg total) by mouth 3 (three) times daily.   Cholecalciferol (VITAMIN D3) 5000 units CAPS Take 5,000 Units by mouth daily.   DiphenhydrAMINE HCl, Sleep, (ZZZQUIL PO) Take 1 Dose by mouth at bedtime as needed (sleep).   esomeprazole (NEXIUM) 20 MG packet Take 20 mg by mouth daily.    losartan (COZAAR) 50 MG tablet Take 1 tablet (50 mg total) by mouth daily.   mesalamine (CANASA) 1000 MG suppository INSERT 1 SUPPOSITORY RECTALLY ONCE DAILY   metoprolol tartrate (LOPRESSOR) 25 MG tablet Take 1 tablet by mouth twice daily   Misc Natural Products (OSTEO BI-FLEX TRIPLE STRENGTH PO) Take 2 tablets by mouth daily.   nitroGLYCERIN (NITROSTAT) 0.4 MG SL tablet Place 1 tablet (0.4 mg total) under the tongue every 5 (five) minutes x 3 doses as needed for chest pain.   Nutritional Supplements (NUTRITIONAL SUPPLEMENT PO) Take by mouth. Test Max Boost- 1 tablet by mouth three times a day   No facility-administered encounter medications on file as of 01/11/2022.    Social History: Social History   Tobacco Use   Smoking status: Never   Smokeless tobacco: Never  Vaping Use   Vaping Use: Never used  Substance Use Topics   Alcohol use: Yes    Alcohol/week: 0.0 standard drinks of alcohol    Comment: rarely   Drug use: No    Family Medical History: Family History  Problem Relation Age of Onset   Coronary artery disease Mother    Heart attack Mother    Hyperlipidemia Mother    Hypertension Mother    Colitis Mother    Diabetes Father    Hyperlipidemia Father    Hypertension Father    Coronary artery disease Father    Heart attack Father    Coronary artery disease Brother    Heart attack Brother 55       4 stents, smoker   Diabetes Brother    Hypertension Brother    Hypertension Sister    Transient ischemic attack Sister    Colon cancer Neg Hx    Esophageal cancer Neg Hx    Stomach cancer Neg Hx    Rectal cancer Neg Hx     Physical Examination: There were no vitals filed for this visit.  General: Patient is well developed, well nourished, calm, collected, and in no apparent distress. Attention to examination is appropriate.  Respiratory: Patient is breathing without any difficulty.   NEUROLOGICAL:     Awake, alert, oriented to person, place, and time.  Speech  is clear and fluent. Fund of knowledge is appropriate.   Cranial Nerves: Pupils equal round and reactive to light.  Facial tone is symmetric.  Facial sensation is symmetric.  ROM of spine:  *** ROM of cervical spine *** pain *** ROM of lumbar spine *** pain  No abnormal lesions on exposed skin.   Strength: Side Biceps Triceps Deltoid Interossei Grip Wrist Ext. Wrist Flex.  R 5 5 5 5 5 5 5   L 5 5 5 5 5 5 5    Side Iliopsoas Quads Hamstring PF DF EHL  R 5 5 5 5 5 5   L 5 5 5 5 5 5    Reflexes are ***2+ and symmetric at the biceps, triceps, brachioradialis,  patella and achilles.   Hoffman's is absent.  Clonus is not present.   Bilateral upper and lower extremity sensation is intact to light touch.    No evidence of dysmetria noted.  Gait is normal.   ***No difficulty with tandem gait.    Medical Decision Making  Imaging: Lumbar MRI 07/11/19:  L4-L5: severe multifactorial spinal stenosis that could cause neural compression on either or both sides. Advanced bilateral facet arthropathy with 3 mm anterolisthesis. Bulging of the disc more towards the right. Additinally, there is foraminal stenosis left worse than right that could effect in particular the left L4 nerve.   L2-L3: right lateral recess and foraminal narrowing due to endplate osteophytes and bulging of the disc could affect the right L2 and L3 nerves.   Left foraminal stenosis at L5-S1 because of encroachment by osteophyte and bulging disc material and by facet arthropathy could affect the left L5 nerve.   I have personally reviewed the images and agree with the above interpretation. MRI report scanned under media. ***  Lumbar xrays***:   Assessment and Plan: John Kelly is a pleasant 56 y.o. male with ***  Above treatment options discussed with patient and following plan made:   - Order for physical therapy for *** spine ***. - Continue on current medications including ***. Reviewed proper dosing along with risks and  benefits. Take and NSAIDs with food.      I spent a total of *** minutes in face-to-face and non-face-to-face activities related to this patient's care toda including review of outside records, review of imaging, review of symptoms, physical exam, discussion of differential diagnosis, discussion of treatment options, and documentation.   Thank you for involving me in the care of this patient.   Geronimo Boot PA-C Dept. of Neurosurgery

## 2022-01-11 ENCOUNTER — Ambulatory Visit
Admission: RE | Admit: 2022-01-11 | Discharge: 2022-01-11 | Disposition: A | Discharge status: Home / Self Care | Payer: BC Managed Care – PPO | Attending: Orthopedic Surgery | Admitting: Orthopedic Surgery

## 2022-01-11 ENCOUNTER — Ambulatory Visit: Payer: BC Managed Care – PPO | Admitting: Orthopedic Surgery

## 2022-01-11 ENCOUNTER — Encounter: Payer: Self-pay | Admitting: Orthopedic Surgery

## 2022-01-11 ENCOUNTER — Ambulatory Visit
Admission: RE | Admit: 2022-01-11 | Discharge: 2022-01-11 | Disposition: A | Discharge status: Home / Self Care | Payer: BC Managed Care – PPO | Source: Ambulatory Visit | Attending: Orthopedic Surgery | Admitting: Orthopedic Surgery

## 2022-01-11 VITALS — BP 122/78 | Ht 67.0 in | Wt 174.4 lb

## 2022-01-11 DIAGNOSIS — M48061 Spinal stenosis, lumbar region without neurogenic claudication: Secondary | ICD-10-CM

## 2022-01-11 DIAGNOSIS — M4316 Spondylolisthesis, lumbar region: Secondary | ICD-10-CM | POA: Diagnosis present

## 2022-01-15 ENCOUNTER — Other Ambulatory Visit: Payer: Self-pay | Admitting: Orthopedic Surgery

## 2022-01-15 DIAGNOSIS — M4316 Spondylolisthesis, lumbar region: Secondary | ICD-10-CM

## 2022-01-15 DIAGNOSIS — M48061 Spinal stenosis, lumbar region without neurogenic claudication: Secondary | ICD-10-CM

## 2022-01-15 DIAGNOSIS — M5416 Radiculopathy, lumbar region: Secondary | ICD-10-CM

## 2022-01-15 NOTE — Progress Notes (Signed)
Lumbar spine xrays dated 01/11/22:  FINDINGS: No fracture.  No bone lesion.   Grade 1 anterolisthesis of L4 on L5. No other spondylolisthesis. Mild levoscoliosis, apex at L2-L3.   Moderate loss of disc height at L2-L3. Mild loss of disc height at L4-L5. Small endplate osteophytes most evident at L1-L2 and L2-L3.   No subluxation with flexion or extension.   IMPRESSION: 1. No fracture or acute finding. 2. Degenerative changes as detailed including a grade 1 anterolisthesis of L4 on L5. No subluxation with flexion or extension.     Electronically Signed   By: Lajean Manes M.D.   On: 01/13/2022 16:02   I have personally reviewed the images and agree with the above interpretation. MRI report scanned under media.    Previous MRI showed severe spinal stenosis L4-L5. Will put in orders for lumbar ESI with [facility]. If no improvement or if his pain gets worse, will need to get updated lumbar MRI (last 07/11/19). Message sent to patient.   Will keep scheduled f/u phone visit on 12/7.

## 2022-01-18 ENCOUNTER — Ambulatory Visit
Admission: RE | Admit: 2022-01-18 | Discharge: 2022-01-18 | Disposition: A | Discharge status: Home / Self Care | Payer: BC Managed Care – PPO | Source: Ambulatory Visit | Attending: Neurology | Admitting: Neurology

## 2022-01-18 DIAGNOSIS — Z79899 Other long term (current) drug therapy: Secondary | ICD-10-CM

## 2022-01-18 DIAGNOSIS — G35 Multiple sclerosis: Secondary | ICD-10-CM

## 2022-01-18 DIAGNOSIS — F5104 Psychophysiologic insomnia: Secondary | ICD-10-CM

## 2022-01-18 MED ORDER — GADOBENATE DIMEGLUMINE 529 MG/ML IV SOLN
15.0000 mL | Freq: Once | INTRAVENOUS | Status: AC | PRN
  Administered 2022-01-18: 15 mL via INTRAVENOUS

## 2022-01-19 ENCOUNTER — Ambulatory Visit
Admission: RE | Admit: 2022-01-19 | Discharge: 2022-01-19 | Disposition: A | Discharge status: Home / Self Care | Payer: BC Managed Care – PPO | Source: Ambulatory Visit | Attending: Orthopedic Surgery | Admitting: Orthopedic Surgery

## 2022-01-19 DIAGNOSIS — M5416 Radiculopathy, lumbar region: Secondary | ICD-10-CM

## 2022-01-19 DIAGNOSIS — M48061 Spinal stenosis, lumbar region without neurogenic claudication: Secondary | ICD-10-CM

## 2022-01-19 DIAGNOSIS — M4316 Spondylolisthesis, lumbar region: Secondary | ICD-10-CM

## 2022-01-19 MED ORDER — METHYLPREDNISOLONE ACETATE 40 MG/ML INJ SUSP (RADIOLOG
80.0000 mg | Freq: Once | INTRAMUSCULAR | Status: AC
  Administered 2022-01-19: 80 mg via EPIDURAL

## 2022-01-19 MED ORDER — IOPAMIDOL (ISOVUE-M 200) INJECTION 41%
1.0000 mL | Freq: Once | INTRAMUSCULAR | Status: AC
  Administered 2022-01-19: 1 mL via EPIDURAL

## 2022-01-19 NOTE — Discharge Instructions (Signed)

## 2022-01-25 ENCOUNTER — Ambulatory Visit: Payer: BLUE CROSS/BLUE SHIELD | Admitting: Orthopedic Surgery

## 2022-02-21 NOTE — Progress Notes (Unsigned)
   Telephone Visit- Progress Note: Referring Physician:  Jinny Sanders, MD [address] [place],  [place] [address]  Primary Physician:  Jinny Sanders, MD  This visit was performed via telephone.  Patient location: home Provider location: office  I spent a total of 10 minutes non-face-to-face activities for this visit on the date of this encounter including review of current clinical condition and response to treatment.  Patient has given verbal consent to this telephone visits and we reviewed the limitations of a telephone visit. Patient wishes to proceed.    Chief Complaint:  f/u after lumbar ESI at [facility]  History of Present Illness: Lewellyn Fultz is a 56 y.o. male that was last seen by me on 01/11/22 for intermittent LBP with bilateral leg pain to his knees that is worse with standing and walking.     He has known slip at L4-L5 with diffuse lumbar spondylosis and DDD. Also with severe spinal stenosis at L4-L5 from MRI 07/11/19.    He had improvement with previous ESIs with [facility]. He had a left IL ESI at L4-L5 on 01/19/22.   He has a phone schedule scheduled for follow up.   He had improvement with his injection, but it did not help as much as before. Overall, he is still much better. His leg pain is gone. He has minimal intermittent LBP that was worse when he was at gym lifting weights. No numbness, tingling, or weakness in his legs.    Conservative measures:  Physical therapy: has done in past, none recent. He works out regularly.   Multimodal medical therapy including regular antiinflammatories: tylenol  Injections:  L4-L5 left IL ESI on 01/19/22 L4-L5 IL ESI on 09/14/21 L4-L5 IL ESI on 04/17/21 L4-L5 IL ESI on 12/15/20 L4-L5 IL ESI on 06/30/21 L4-L5 IL ESI on 01/25/20 L4-L5 IL ESI on 10/07/19 L4-L5 IL ESI on 07/21/19   Past Surgery: no spinal surgery    Exam: No exam done as this was a telephone encounter.     Imaging: Nothing recent  to review.    Assessment and Plan: Mr. Coble is a pleasant 56 y.o. male with improvement in symptoms after lumbar ESI. His leg pain is gone. He has minimal intermittent LBP that was worse when he was at gym lifting weights.  He has known slip at L4-L5 with diffuse lumbar spondylosis and DDD. Also with severe spinal stenosis at L4-L5 from MRI 07/11/19.    Treatment options discussed with patient and following plan made:   - Continue with current activity.  - Care when lifting weights, he is working more on technique and not heavy weights.  - If he gets worse, consider updated lumbar MRI. Last was 07/11/19.  - He wants to hold on any further treatment for now. He will f/u prn at his request.   Geronimo Boot [facility] Neurosurgery

## 2022-02-22 ENCOUNTER — Encounter: Payer: Self-pay | Admitting: Orthopedic Surgery

## 2022-02-22 ENCOUNTER — Ambulatory Visit (INDEPENDENT_AMBULATORY_CARE_PROVIDER_SITE_OTHER): Payer: BC Managed Care – PPO | Admitting: Orthopedic Surgery

## 2022-02-22 DIAGNOSIS — M4316 Spondylolisthesis, lumbar region: Secondary | ICD-10-CM

## 2022-02-22 DIAGNOSIS — M5416 Radiculopathy, lumbar region: Secondary | ICD-10-CM

## 2022-02-22 DIAGNOSIS — M48061 Spinal stenosis, lumbar region without neurogenic claudication: Secondary | ICD-10-CM | POA: Diagnosis not present

## 2022-04-03 DIAGNOSIS — M4316 Spondylolisthesis, lumbar region: Secondary | ICD-10-CM

## 2022-04-03 DIAGNOSIS — M5416 Radiculopathy, lumbar region: Secondary | ICD-10-CM

## 2022-04-03 DIAGNOSIS — M48061 Spinal stenosis, lumbar region without neurogenic claudication: Secondary | ICD-10-CM

## 2022-04-05 NOTE — Addendum Note (Signed)
Addended byGeronimo Boot on: 04/05/2022 04:21 PM   Modules accepted: Orders

## 2022-04-05 NOTE — Telephone Encounter (Signed)
He had some relief with left IL ESI at L4-L5 on 01/19/22, but pain is not back and worse.   He has known slip at L4-L5 with diffuse lumbar spondylosis and DDD. Also with severe spinal stenosis at L4-L5 from MRI 07/11/19.    Recommend updated lumbar MRI. This was ordered.   Also with some neck pain, he had recent cervical MRI.   He will see me back after lumbar MRI to review both cervical and lumbar MRIs.

## 2022-04-17 ENCOUNTER — Ambulatory Visit
Admission: RE | Admit: 2022-04-17 | Discharge: 2022-04-17 | Disposition: A | Discharge status: Home / Self Care | Payer: BC Managed Care – PPO | Source: Ambulatory Visit | Attending: Orthopedic Surgery | Admitting: Orthopedic Surgery

## 2022-04-17 DIAGNOSIS — M48061 Spinal stenosis, lumbar region without neurogenic claudication: Secondary | ICD-10-CM | POA: Diagnosis present

## 2022-04-17 DIAGNOSIS — M5416 Radiculopathy, lumbar region: Secondary | ICD-10-CM | POA: Diagnosis present

## 2022-04-17 DIAGNOSIS — M4316 Spondylolisthesis, lumbar region: Secondary | ICD-10-CM | POA: Insufficient documentation

## 2022-04-19 NOTE — Progress Notes (Addendum)
Referring Physician:  No referring provider defined for this encounter.  Primary Physician:  Jinny Sanders, MD  History of Present Illness: 04/24/2022 [name] has seen Dr. Lacinda Axon in the past. He was sent to [facility] imaging for lumbar ESI back in June. MRI from April 2021 showed severe spinal stenosis at L4-L5 with slip.   I saw him back on 01/11/22 and ordered a repeat lumbar injection. He had a left IL ESI at L4-L5 on 01/19/22. This injection did not help as much as previous ones. He has known slip at L4-L5 with diffuse lumbar spondylosis and DDD. Also with severe spinal stenosis at L4-L5 from MRI 07/11/19.   He called with increased pain and lumbar MRI was ordered. He is also here for new evaluation of neck pain.   He has worsening back pain. He notes constant LBP with bilateral leg pain posterior to his knees that is worse with standing and walking. He has some relief when he sits/stops walking.   He has MS and has chronic numbness and tingling in his feet.   He has constant neck pain with no arm pain x few months. He has intermittent numbness/tingling in his arms x years (has MS). No known weakness. Pain is worse with moving his neck. No known injury. Pain is more of a soreness.   Conservative measures:  Physical therapy: has done in past, none recent. He works out regularly.   Multimodal medical therapy including regular antiinflammatories: tylenol  Injections:  L4-L5 IL ESI on 01/19/22 L4-L5 IL ESI on 09/14/21 L4-L5 IL ESI on 04/17/21 L4-L5 IL ESI on 12/15/20 L4-L5 IL ESI on 06/30/21 L4-L5 IL ESI on 01/25/20 L4-L5 IL ESI on 10/07/19 L4-L5 IL ESI on 07/21/19  Past Surgery: no spinal surgery  Eliaz Garbarini has no symptoms of cervical myelopathy.  The symptoms are causing a significant impact on the patient's life.   Review of Systems:  A 10 point review of systems is negative, except for the pertinent positives and negatives detailed in the HPI.  Past Medical  History: Past Medical History:  Diagnosis Date   Abnormal liver function test    Allergic rhinitis    Allergy    Anxiety    CAD (coronary artery disease) 09/01/2016   a.  09/01/16; NSTEMI LHC: 60-70% ostial LAD stenosis, 20% mid LAD, 30% RCA. EF 55-65%  b. 09/07/16 [place] s/p DES to oLAD   DDD (degenerative disc disease)    Depression    GERD (gastroesophageal reflux disease)    Hyperlipidemia    Hypertension    no meds   Insomnia    MS (multiple sclerosis) (HCC)    Myocardial infarction ([place])    2018   Neuromuscular disorder ([place])    MS   Ulcerative colitis    left sided    Past Surgical History: Past Surgical History:  Procedure Laterality Date   COLONOSCOPY     CORONARY ANGIOGRAPHY N/A 09/07/2016   Procedure: Coronary Angiography;  Surgeon: Burnell Blanks, MD;  Location: [place] [facility];  Service: Cardiovascular;  Laterality: N/A;   CORONARY STENT INTERVENTION N/A 09/07/2016   Procedure: Coronary Stent Intervention;  Surgeon: Burnell Blanks, MD;  Location: [place] [facility];  Service: Cardiovascular;  Laterality: N/A;   Inguinal Herniorrhapy     Right and left   LEFT HEART CATH AND CORONARY ANGIOGRAPHY N/A 09/01/2016   Procedure: Left Heart Cath and Coronary Angiography;  Surgeon: Troy Sine, MD;  Location: [place]  [facility];  Service: Cardiovascular;  Laterality: N/A;   LEFT HEART CATH AND CORONARY ANGIOGRAPHY N/A 09/07/2016   Procedure: Left Heart Cath and Coronary Angiography;  Surgeon: Burnell Blanks, MD;  Location: [place] [facility];  Service: Cardiovascular;  Laterality: N/A;   PYLOROPLASTY      Allergies: Allergies as of 04/24/2022 - Review Complete 04/24/2022  Allergen Reaction Noted   Diclofenac sodium Shortness Of Breath and Swelling    Skelaxin [metaxalone] Nausea And Vomiting 01/17/2010   Trazodone and nefazodone Other (See Comments) 09/06/2016    Medications: Outpatient Encounter Medications as of 04/24/2022  Medication  Sig   acetaminophen (TYLENOL) 500 MG tablet Take 1,000 mg by mouth daily as needed for moderate pain or headache.   Ascorbic Acid (VITAMIN C) 1000 MG tablet Take 1,000 mg by mouth daily.   aspirin 81 MG EC tablet Take 1 tablet (81 mg total) by mouth daily.   baclofen (LIORESAL) 10 MG tablet Take 10 mg by mouth 2 (two) times daily.   balsalazide (COLAZAL) 750 MG capsule Take 3 capsules (2,250 mg total) by mouth 3 (three) times daily.   Cholecalciferol (VITAMIN D3) 5000 units CAPS Take 5,000 Units by mouth daily.   DiphenhydrAMINE HCl, Sleep, (ZZZQUIL PO) Take 1 Dose by mouth at bedtime as needed (sleep).   esomeprazole (NEXIUM) 20 MG packet Take 20 mg by mouth daily.   losartan (COZAAR) 50 MG tablet Take 1 tablet (50 mg total) by mouth daily.   mesalamine (CANASA) 1000 MG suppository INSERT 1 SUPPOSITORY RECTALLY ONCE DAILY   metoprolol tartrate (LOPRESSOR) 25 MG tablet Take 1 tablet by mouth twice daily   Misc Natural Products (OSTEO BI-FLEX TRIPLE STRENGTH PO) Take 2 tablets by mouth daily.   nitroGLYCERIN (NITROSTAT) 0.4 MG SL tablet Place 1 tablet (0.4 mg total) under the tongue every 5 (five) minutes x 3 doses as needed for chest pain.   Nutritional Supplements (NUTRITIONAL SUPPLEMENT PO) Take by mouth. Test Max Boost- 1 tablet by mouth three times a day   vitamin E 45 MG (100 UNITS) capsule Take 100 Units by mouth daily.   No facility-administered encounter medications on file as of 04/24/2022.    Social History: Social History   Tobacco Use   Smoking status: Never   Smokeless tobacco: Never  Vaping Use   Vaping Use: Never used  Substance Use Topics   Alcohol use: Yes    Alcohol/week: 0.0 standard drinks of alcohol    Comment: rarely   Drug use: No    Family Medical History: Family History  Problem Relation Age of Onset   Coronary artery disease Mother    Heart attack Mother    Hyperlipidemia Mother    Hypertension Mother    Colitis Mother    Diabetes Father     Hyperlipidemia Father    Hypertension Father    Coronary artery disease Father    Heart attack Father    Coronary artery disease Brother    Heart attack Brother 46       4 stents, smoker   Diabetes Brother    Hypertension Brother    Hypertension Sister    Transient ischemic attack Sister    Colon cancer Neg Hx    Esophageal cancer Neg Hx    Stomach cancer Neg Hx    Rectal cancer Neg Hx     Physical Examination: Vitals:   04/24/22 1508 04/24/22 1540  BP: (!) 163/86 (!) 150/88  Pulse: 72  Awake, alert, oriented to person, place, and time.  Speech is clear and fluent. Fund of knowledge is appropriate.   Cranial Nerves: Pupils equal round and reactive to light.  Facial tone is symmetric.    He has tenderness in bilateral trapezial region. Minimal lower posterior cervical tenderness.  He has mild mid lower lumbar tenderness.   Strength: Side Biceps Triceps Deltoid Interossei Grip Wrist Ext. Wrist Flex.  R 5 5 5 5 5 5 5  $ L 5 5 5 5 5 5 5   $ Side Iliopsoas Quads Hamstring PF DF EHL  R 5 5 5 5 5 5  $ L 5 5 5 5 5 5   $ Reflexes are 1+ and symmetric at the biceps, triceps, brachioradialis, patella and achilles.   Hoffman's is absent.  Clonus is not present.   Bilateral upper and lower extremity sensation is intact to light touch.     Gait is normal.     Medical Decision Making  Imaging: Lumbar MRI dated 04/17/22:  FINDINGS: Segmentation:  Standard.   Alignment:  Mild levoscoliosis and L4-5 anterolisthesis   Vertebrae:  No fracture, evidence of discitis, or bone lesion.   Conus medullaris and cauda equina: Conus extends to the L1-2 level. Conus and cauda equina appear normal.   Paraspinal and other soft tissues: Negative for perispinal mass or inflammation. Stable T2 hyperintensities at the left kidney.   Disc levels:   L1-L2: Disc narrowing and bulging. Degenerative facet spurring and ligamentum flavum thickening. Right subarticular recess stenosis that could  affect the right L2 nerve root. Moderate right foraminal narrowing   L2-L3: Disc collapse towards the right where there is disc bulging and endplate spurring. Asymmetric right facet spurring. Right subarticular recess stenosis that could affect the right L3 nerve root. Moderate right foraminal narrowing   L3-L4: Disc narrowing and bulging with right foraminal protrusion that is shallow. Mild facet spurring and ligamentum flavum thickening. Moderate spinal canal narrowing but no nerve root compression   L4-L5: Facet degeneration with spurring that is asymmetrically bulky on the left. The disc is narrowed and bulging with mild anterolisthesis. Severe spinal stenosis. Advanced left and moderate right foraminal narrowing   L5-S1:Disc narrowing and left eccentric bulging. Asymmetric left facet spurring. Left foraminal impingement.   IMPRESSION: 1. Advanced and generalized lumbar spine degeneration with scoliosis and L4-5 anterolisthesis. 2. Spinal stenosis that is severe at L4-5. 3. Right subarticular recess impingement at L1-2 and L2-3. 4. Advanced foraminal impingement on the left at L4-5 and L5-S1. Moderate right foraminal narrowing at L1-2, L2-3, and L4-5.     Electronically Signed   By: Jorje Guild M.D.   On: 04/18/2022 12:30  Cervical MRI dated 01/18/22:  FINDINGS: Alignment: Mild straightening of cervical lordosis. No spondylolisthesis.   Vertebrae: Some degenerative endplate marrow signal changes throughout the cervical spine. Background bone marrow signal is within normal limits. No marrow edema or evidence of acute osseous abnormality.   Cord: Multilevel degenerative cervical spinal cord mass effect, tail below. But no cervical cord signal abnormality. No evidence of cervical cord demyelination. No abnormal intradural enhancement or dural thickening.   Posterior Fossa, vertebral arteries, paraspinal tissues: Cervicomedullary junction is within normal limits.  Brain is reported separately today.   Preserved major vascular flow voids in the neck. Bilateral cervical lymph nodes are somewhat prominent but remain within normal limits.   And neck soft tissues are within normal limits bilaterally except for evidence of mild but confluent soft tissue inflammation with enhancement surrounding the C7  spinous process. See series 12, images 7 through 11. Some involvement of the adjacent interspinous ligaments. No fluid collection or marrow edema.   Disc levels:   C2-C3: Disc space loss with fairly symmetric circumferential disc osteophyte complex. Mild facet and ligament flavum hypertrophy. Mild spinal stenosis. Mild if any cord mass effect. No foraminal stenosis.   C3-C4: Disc space loss with circumferential disc osteophyte complex eccentric to the right (series 13, image 15). Mild facet and ligament flavum hypertrophy. Mild spinal stenosis and mild ventral cord mass effect. Mild to moderate left but moderate to severe right C4 foraminal stenosis.   C4-C5: Bulky circumferential disc osteophyte complex with a broad-based posterior component (series 13, image 20). Mild if any facet and ligament flavum hypertrophy. Mild spinal stenosis and ventral cord mass effect. Moderate to severe bilateral C5 foraminal stenosis.   C5-C6: Disc space loss with similar bulky circumferential disc osteophyte complex. Broad-based posterior component eccentric to the left (series 13, image 25). Mild facet and ligament flavum hypertrophy. Mild spinal stenosis and circumferential cord mass effect, which is most pronounced at this level. Severe left and moderate to severe right C6 foraminal stenosis.   C6-C7: Disc space loss with more anterior circumferential disc osteophyte complex. Borderline to mild spinal stenosis. Mild facet hypertrophy greater on the left. Moderate to severe left and mild-to-moderate right C7 foraminal stenosis.   C7-T1: Negative disc. Mild  facet hypertrophy. No significant stenosis.   No visible upper thoracic spinal stenosis.   IMPRESSION: 1. Acute soft tissue inflammation surrounding the C7 spinous process most compatible with a Cervical Interspinous Bursitis. No complicating features such as paraspinal fluid collection or spinous process marrow edema. 2. Superimposed widespread cervical spine degeneration with bulky disc and endplate degeneration. Subsequent mild multifactorial spinal stenosis C2-C3 through C6-C7 with mild spinal cord mass effect (most pronounced at C5-C6) but no associated cord signal abnormality. 3. No evidence of cervical spinal cord demyelination. 4. Moderate or severe degenerative neural foraminal stenosis at the right C4, bilateral C5 and C6, and left C7 nerve levels.     Electronically Signed   By: Genevie Ann M.D.   On: 01/21/2022 09:43    I have personally reviewed the images and agree with the above interpretation.   MRI of cervical spine reviewed with Dr. Izora Ribas prior to his visit.    Assessment and Plan: [name] is a pleasant 57 y.o. male with intermittent LBP with bilateral leg pain to his knees that is worse with standing and walking. This feels like his typical back symptoms. He had improvement with ESI in June, but pain is starting to return.   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 He also has multilevel foraminal stenosis.   Also with a few months history of constant neck pain/soreness with no arm pain. Pain is worse with moving his neck. He has intermittent numbness/tingling in his arms x years (has MS). No known weakness.   Cervical MRI shows cervical spondylosis with mild central stenosis C2-C7 with multilevel foraminal stenosis.   Current neck pain appears more myofascial in nature. No radicular symptoms.   Treatment options discussed with patient and following plan made:   - He is not interested in any surgery options for his lumbar  spine.  - He does not want to do any PT for his lumbar spine.  - He would like to proceed with repeat lumbar ESI with [facility]. Message sent to [facility] to see if this  can be done.   - Recommend PT for his cervical spine. He cannot swing this with his work schedule. He will let me know if this changes so I can order it.   BP was elevated, it was 163/86 initially. No symptoms of chest pain, shortness of breath, blurry vision, or headaches. Recheck at end of visit was 150/88.   He checks BP at home and it generally runs lower. Will recheck at home and call PCP if not improved. If he develops CP, SOB, blurry vision, or headaches, then he will go to ED.     I spent a total of 25 minutes in face-to-face and non-face-to-face activities related to this patient's care toda including review of outside records, review of imaging, review of symptoms, physical exam, discussion of differential diagnosis, discussion of treatment options, and documentation.   ADDENDUM 04/26/22:  Reviewed with [facility] and he can be scheduled for another injection. Will place order for L4-L5 IL ESI. Keep phone visit follow up with me in 6-8 weeks.   ADDENDUM 04/30/22:  Reviewed above plan along with above cervical MRI. He agrees with plan.   Geronimo Boot PA-C Dept. of Neurosurgery

## 2022-04-24 ENCOUNTER — Encounter: Payer: Self-pay | Admitting: Orthopedic Surgery

## 2022-04-24 ENCOUNTER — Ambulatory Visit: Payer: BC Managed Care – PPO | Admitting: Orthopedic Surgery

## 2022-04-24 VITALS — BP 150/88 | HR 72 | Ht 67.0 in | Wt 180.4 lb

## 2022-04-24 DIAGNOSIS — M48061 Spinal stenosis, lumbar region without neurogenic claudication: Secondary | ICD-10-CM

## 2022-04-24 DIAGNOSIS — M5416 Radiculopathy, lumbar region: Secondary | ICD-10-CM | POA: Diagnosis not present

## 2022-04-24 DIAGNOSIS — M4316 Spondylolisthesis, lumbar region: Secondary | ICD-10-CM | POA: Diagnosis not present

## 2022-04-24 DIAGNOSIS — M47812 Spondylosis without myelopathy or radiculopathy, cervical region: Secondary | ICD-10-CM | POA: Diagnosis not present

## 2022-04-24 DIAGNOSIS — M7918 Myalgia, other site: Secondary | ICD-10-CM

## 2022-04-24 NOTE — Patient Instructions (Signed)
It was so nice to see you today. Thank you so much for coming in.    Your lower back MRI shows the slip at L4-L5 with severe spinal stenosis.   The MRI of your neck shows wear and tear- I think most of the pain in your neck is muscular/soft tissue in nature.   I will contact [facility] about another lumbar injection and they will call you to schedule.   I recommend PT for your cervical spine. Let me know when this will fit into your work schedule and I can order.   Your blood pressure was elevated today, it was 150/88. I want you to recheck it at home and follow up with your PCP if it remains high. If you have any chest pain, shortness of breath, blurry vision, or headaches then you need to go to ED.   Please do not hesitate to call if you have any questions or concerns. You can also message me in [place].   Geronimo Boot PA-C [phone]

## 2022-04-26 NOTE — Addendum Note (Signed)
Addended byGeronimo Boot on: 04/26/2022 03:30 PM   Modules accepted: Orders

## 2022-05-09 ENCOUNTER — Encounter: Payer: Self-pay | Admitting: Family Medicine

## 2022-05-09 ENCOUNTER — Ambulatory Visit: Payer: BC Managed Care – PPO | Admitting: Family Medicine

## 2022-05-09 ENCOUNTER — Ambulatory Visit
Admission: RE | Admit: 2022-05-09 | Discharge: 2022-05-09 | Disposition: A | Discharge status: Home / Self Care | Payer: BC Managed Care – PPO | Source: Ambulatory Visit | Attending: Orthopedic Surgery | Admitting: Orthopedic Surgery

## 2022-05-09 VITALS — BP 122/82 | HR 77 | Temp 97.7°F | Ht 67.0 in | Wt 177.0 lb

## 2022-05-09 DIAGNOSIS — M4316 Spondylolisthesis, lumbar region: Secondary | ICD-10-CM

## 2022-05-09 DIAGNOSIS — R590 Localized enlarged lymph nodes: Secondary | ICD-10-CM | POA: Diagnosis not present

## 2022-05-09 DIAGNOSIS — M48061 Spinal stenosis, lumbar region without neurogenic claudication: Secondary | ICD-10-CM

## 2022-05-09 DIAGNOSIS — M5416 Radiculopathy, lumbar region: Secondary | ICD-10-CM

## 2022-05-09 MED ORDER — METHYLPREDNISOLONE ACETATE 40 MG/ML INJ SUSP (RADIOLOG
80.0000 mg | Freq: Once | INTRAMUSCULAR | Status: AC
  Administered 2022-05-09: 80 mg via EPIDURAL

## 2022-05-09 MED ORDER — IOPAMIDOL (ISOVUE-M 200) INJECTION 41%
1.0000 mL | Freq: Once | INTRAMUSCULAR | Status: AC
  Administered 2022-05-09: 1 mL via EPIDURAL

## 2022-05-09 NOTE — Progress Notes (Signed)
Patient ID: John Kelly, male    DOB: 12-Sep-1965, 57 y.o.   MRN: [id number]  This visit was conducted in person.  BP 122/82 (BP Location: Left Arm, Patient Position: Sitting, Cuff Size: Normal)   Pulse 77   Temp 97.7 F (36.5 C) (Temporal)   Ht 5' 7"$  (1.702 m)   Wt 177 lb (80.3 kg)   SpO2 99%   BMI 27.72 kg/m    CC:  Chief Complaint  Patient presents with   Mass    On right side of neck x 1 week. Patient states it can be tender to the touch    Subjective:   HPI: John Kelly is a 57 y.o. male presenting on 05/09/2022 for Mass (On right side of neck x 1 week. Patient states it can be tender to the touch)   He  reports new onset lumps x 2 in right lateral neck,,,  1 week ago. No changes in last week.   Noted after being sick for 1 week with ST, HA, fever 101F... all this has resolved.  Home COVID test negative.  No current ear pain, ST.   Did note rash on right should .Marland Kitchen Burning, irritating, not itchy... resolved now/ scabbing over       Relevant past medical, surgical, family and social history reviewed and updated as indicated. Interim medical history since our last visit reviewed. Allergies and medications reviewed and updated. Outpatient Medications Prior to Visit  Medication Sig Dispense Refill   acetaminophen (TYLENOL) 500 MG tablet Take 1,000 mg by mouth daily as needed for moderate pain or headache.     Ascorbic Acid (VITAMIN C) 1000 MG tablet Take 1,000 mg by mouth daily.     aspirin 81 MG EC tablet Take 1 tablet (81 mg total) by mouth daily. 30 tablet 11   baclofen (LIORESAL) 10 MG tablet Take 10 mg by mouth 2 (two) times daily.     balsalazide (COLAZAL) 750 MG capsule Take 3 capsules (2,250 mg total) by mouth 3 (three) times daily. 270 capsule 11   Cholecalciferol (VITAMIN D3) 5000 units CAPS Take 5,000 Units by mouth daily.     DiphenhydrAMINE HCl, Sleep, (ZZZQUIL PO) Take 1 Dose by mouth at bedtime as needed (sleep).     esomeprazole (NEXIUM) 20 MG packet  Take 20 mg by mouth daily.     losartan (COZAAR) 50 MG tablet Take 1 tablet (50 mg total) by mouth daily. 90 tablet 3   mesalamine (CANASA) 1000 MG suppository INSERT 1 SUPPOSITORY RECTALLY ONCE DAILY 30 suppository 0   metoprolol tartrate (LOPRESSOR) 25 MG tablet Take 1 tablet by mouth twice daily 90 tablet 3   Misc Natural Products (OSTEO BI-FLEX TRIPLE STRENGTH PO) Take 2 tablets by mouth daily.     nitroGLYCERIN (NITROSTAT) 0.4 MG SL tablet Place 1 tablet (0.4 mg total) under the tongue every 5 (five) minutes x 3 doses as needed for chest pain. 25 tablet 6   Nutritional Supplements (NUTRITIONAL SUPPLEMENT PO) Take by mouth. Test Max Boost- 1 tablet by mouth three times a day     vitamin E 45 MG (100 UNITS) capsule Take 100 Units by mouth daily.     No facility-administered medications prior to visit.     Per HPI unless specifically indicated in ROS section below Review of Systems  Constitutional:  Negative for fatigue and fever.  HENT:  Negative for ear pain.   Eyes:  Negative for pain.  Respiratory:  Negative for cough and  shortness of breath.   Cardiovascular:  Negative for chest pain, palpitations and leg swelling.  Gastrointestinal:  Negative for abdominal pain.  Genitourinary:  Negative for dysuria.  Musculoskeletal:  Negative for arthralgias.  Neurological:  Negative for syncope, light-headedness and headaches.  Psychiatric/Behavioral:  Negative for dysphoric mood.    Objective:  BP 122/82 (BP Location: Left Arm, Patient Position: Sitting, Cuff Size: Normal)   Pulse 77   Temp 97.7 F (36.5 C) (Temporal)   Ht 5' 7"$  (1.702 m)   Wt 177 lb (80.3 kg)   SpO2 99%   BMI 27.72 kg/m   Wt Readings from Last 3 Encounters:  05/09/22 177 lb (80.3 kg)  04/24/22 180 lb 6.4 oz (81.8 kg)  01/11/22 174 lb 6.4 oz (79.1 kg)      Physical Exam Constitutional:      Appearance: He is well-developed.  HENT:     Head: Normocephalic.     Right Ear: Hearing normal.     Left Ear: Hearing  normal.     Nose: Nose normal.  Neck:     Thyroid: No thyroid mass or thyromegaly.     Vascular: No carotid bruit.     Trachea: Trachea normal.  Cardiovascular:     Rate and Rhythm: Normal rate and regular rhythm.     Pulses: Normal pulses.     Heart sounds: Heart sounds not distant. No murmur heard.    No friction rub. No gallop.     Comments: No peripheral edema Pulmonary:     Effort: Pulmonary effort is normal. No respiratory distress.     Breath sounds: Normal breath sounds.  Lymphadenopathy:     Head:     Right side of head: No submental, submandibular, tonsillar, preauricular, posterior auricular or occipital adenopathy.     Left side of head: No submental, submandibular, tonsillar, preauricular, posterior auricular or occipital adenopathy.     Cervical: Cervical adenopathy present.     Right cervical: Posterior cervical adenopathy present. No superficial or deep cervical adenopathy.    Left cervical: No superficial, deep or posterior cervical adenopathy.     Upper Body:     Right upper body: No supraclavicular, axillary or epitrochlear adenopathy.     Left upper body: No supraclavicular, axillary or epitrochlear adenopathy.     Lower Body: No right inguinal adenopathy. No left inguinal adenopathy.  Skin:    General: Skin is warm and dry.     Findings: No rash.  Psychiatric:        Speech: Speech normal.        Behavior: Behavior normal.        Thought Content: Thought content normal.       Results for orders placed or performed in visit on 12/19/21  CBC with Differential/Platelet  Result Value Ref Range   WBC 4.3 4.0 - 10.5 K/uL   RBC 4.28 4.22 - 5.81 Mil/uL   Hemoglobin 13.5 13.0 - 17.0 g/dL   HCT 39.1 39.0 - 52.0 %   MCV 91.4 78.0 - 100.0 fl   MCHC 34.5 30.0 - 36.0 g/dL   RDW 13.1 11.5 - 15.5 %   Platelets 195.0 150.0 - 400.0 K/uL   Neutrophils Relative % 43.4 43.0 - 77.0 %   Lymphocytes Relative 45.0 12.0 - 46.0 %   Monocytes Relative 10.6 3.0 - 12.0 %    Eosinophils Relative 0.2 0.0 - 5.0 %   Basophils Relative 0.8 0.0 - 3.0 %   Neutro Abs 1.9 1.4 -  7.7 K/uL   Lymphs Abs 2.0 0.7 - 4.0 K/uL   Monocytes Absolute 0.5 0.1 - 1.0 K/uL   Eosinophils Absolute 0.0 0.0 - 0.7 K/uL   Basophils Absolute 0.0 0.0 - 0.1 K/uL  Comprehensive metabolic panel  Result Value Ref Range   Sodium 138 135 - 145 mEq/L   Potassium 4.4 3.5 - 5.1 mEq/L   Chloride 104 96 - 112 mEq/L   CO2 30 19 - 32 mEq/L   Glucose, Bld 86 70 - 99 mg/dL   BUN 14 6 - 23 mg/dL   Creatinine, Ser 1.25 0.40 - 1.50 mg/dL   Total Bilirubin 0.5 0.2 - 1.2 mg/dL   Alkaline Phosphatase 58 39 - 117 U/L   AST 26 0 - 37 U/L   ALT 27 0 - 53 U/L   Total Protein 6.8 6.0 - 8.3 g/dL   Albumin 4.3 3.5 - 5.2 g/dL   GFR 64.10 >60.00 mL/min   Calcium 9.0 8.4 - 10.5 mg/dL  VITAMIN D 25 Hydroxy (Vit-D Deficiency, Fractures)  Result Value Ref Range   VITD 79.17 30.00 - 100.00 ng/mL  TSH  Result Value Ref Range   TSH 3.25 0.35 - 5.50 uIU/mL  Vitamin B12  Result Value Ref Range   Vitamin B-12 503 211 - 911 pg/mL    Assessment and Plan  Lymphadenopathy of right cervical region Assessment & Plan: Acute, most likely reactive lymphadenopathy secondary to rash versus recent viral URI.  Rash on right shoulder appears to be most consistent with possible mild shingles outbreak now 2 weeks out.  Recommended following reactive lymph nodes for resolution over the next 3 to 4 weeks.  If not improving will treat with a course of empiric antibiotics.     Return if symptoms worsen or fail to improve.   Eliezer Lofts, MD

## 2022-05-09 NOTE — Discharge Instructions (Signed)

## 2022-05-09 NOTE — Assessment & Plan Note (Signed)
Acute, most likely reactive lymphadenopathy secondary to rash versus recent viral URI.  Rash on right shoulder appears to be most consistent with possible mild shingles outbreak now 2 weeks out.  Recommended following reactive lymph nodes for resolution over the next 3 to 4 weeks.  If not improving will treat with a course of empiric antibiotics.

## 2022-05-31 ENCOUNTER — Encounter: Payer: Self-pay | Admitting: Gastroenterology

## 2022-06-04 NOTE — Progress Notes (Unsigned)
   Telephone Visit- Progress Note: Referring Physician:  Jinny Sanders, MD [address] [place],  [place] [address]  Primary Physician:  Jinny Sanders, MD  This visit was performed via telephone.  Patient location: home Provider location: office  I spent a total of 5 minutes non-face-to-face activities for this visit on the date of this encounter including review of current clinical condition and response to treatment.    Patient has given verbal consent to this telephone visits and we reviewed the limitations of a telephone visit. Patient wishes to proceed.    Chief Complaint:  recheck after lumbar ESI  History of Present Illness: John Kelly is a 57 y.o. male has a history of MS and has chronic numbness and tingling in his feet.   Last seen by me on 04/24/22 for back with bilateral leg pain. 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 He also has multilevel foraminal stenosis.   He also had constant neck pain and soreness x few months. No arm pain. He has known cervical spondylosis with mild central stenosis C2-C7 with multilevel foraminal stenosis.   Repeat ESI ordered with [facility]. He is not interested in any surgery for his lumbar spine. He declines PT for lumbar and cervical spine.   He had left L4-L5 IL ESI at [place] on 05/09/22.   He is doing very well after his injection. He has minimal intermittent LBP and bilateral leg pain that is tolerable. Overall, he feels good regarding his back. He continues with constant neck pain that is about the same. No arm pain. Pain is more of a soreness and is tolerable. He is home sick today with respiratory illness.   He has MS and has chronic numbness and tingling in his feet.   Conservative measures:  Physical therapy: has done in past, none recent. He works out regularly.   Multimodal medical therapy including regular antiinflammatories: tylenol  Injections:  L4-L5 IL ESI  on 05/09/22 L4-L5 IL ESI on 01/19/22 L4-L5 IL ESI on 09/14/21 L4-L5 IL ESI on 04/17/21 L4-L5 IL ESI on 12/15/20 L4-L5 IL ESI on 06/30/21 L4-L5 IL ESI on 01/25/20 L4-L5 IL ESI on 10/07/19 L4-L5 IL ESI on 07/21/19   Past Surgery: no spinal surgery   John Kelly has no symptoms of cervical myelopathy.  Exam: No exam done as this was a telephone encounter.     Imaging: None   I have personally reviewed the images and agree with the above interpretation.  Assessment and Plan: He is doing very well after his injection. He has minimal intermittent LBP and bilateral leg pain that is tolerable. Overall, he feels good regarding his back.   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 He also has multilevel foraminal stenosis.   He continues with constant neck pain that is about the same. No arm pain. Pain is more of a soreness and is tolerable.   He has known cervical spondylosis with mild central stenosis C2-C7 with multilevel foraminal stenosis.   Treatment options discussed with patient and following plan made:   - He is not interested in any surgery options for his lumbar spine.  - He does not want to do any PT for his cervical or lumbar spine.  - He will f/u prn at his request.   Geronimo Boot PA-C Neurosurgery

## 2022-06-05 ENCOUNTER — Encounter: Payer: Self-pay | Admitting: Orthopedic Surgery

## 2022-06-05 ENCOUNTER — Ambulatory Visit (INDEPENDENT_AMBULATORY_CARE_PROVIDER_SITE_OTHER): Payer: BC Managed Care – PPO | Admitting: Orthopedic Surgery

## 2022-06-05 DIAGNOSIS — M5416 Radiculopathy, lumbar region: Secondary | ICD-10-CM

## 2022-06-05 DIAGNOSIS — M4316 Spondylolisthesis, lumbar region: Secondary | ICD-10-CM

## 2022-06-05 DIAGNOSIS — M47812 Spondylosis without myelopathy or radiculopathy, cervical region: Secondary | ICD-10-CM

## 2022-06-05 DIAGNOSIS — M48061 Spinal stenosis, lumbar region without neurogenic claudication: Secondary | ICD-10-CM | POA: Diagnosis not present

## 2022-07-02 ENCOUNTER — Ambulatory Visit: Payer: BC Managed Care – PPO | Admitting: Family

## 2022-07-02 ENCOUNTER — Encounter: Payer: Self-pay | Admitting: Family

## 2022-07-02 VITALS — BP 138/78 | HR 86 | Temp 98.1°F | Ht 67.0 in | Wt 175.0 lb

## 2022-07-02 DIAGNOSIS — J020 Streptococcal pharyngitis: Secondary | ICD-10-CM | POA: Insufficient documentation

## 2022-07-02 DIAGNOSIS — R059 Cough, unspecified: Secondary | ICD-10-CM | POA: Diagnosis not present

## 2022-07-02 DIAGNOSIS — M5412 Radiculopathy, cervical region: Secondary | ICD-10-CM | POA: Insufficient documentation

## 2022-07-02 DIAGNOSIS — J029 Acute pharyngitis, unspecified: Secondary | ICD-10-CM

## 2022-07-02 LAB — POCT INFLUENZA A/B
Influenza A, POC: NEGATIVE
Influenza B, POC: NEGATIVE

## 2022-07-02 LAB — POCT RAPID STREP A (OFFICE): Rapid Strep A Screen: POSITIVE — AB

## 2022-07-02 MED ORDER — PREDNISONE 10 MG (21) PO TBPK: ORAL_TABLET | ORAL | 0 refills | Status: DC

## 2022-07-02 MED ORDER — AMOXICILLIN 500 MG PO CAPS: 500.0000 mg | ORAL_CAPSULE | Freq: Two times a day (BID) | ORAL | 0 refills | Status: AC

## 2022-07-02 MED ORDER — DEXAMETHASONE SODIUM PHOSPHATE 10 MG/ML IJ SOLN
10.0000 mg | Freq: Once | INTRAMUSCULAR | Status: AC
  Administered 2022-07-02: 10 mg via INTRAMUSCULAR

## 2022-07-02 NOTE — Assessment & Plan Note (Signed)
Suspected flare up  Decadron 10 mg IM in office Prednisone pak to start tomorrow.  Heat to site  Also reach out to PA-C Ms. Doy Mince for evaluation as well if no improvement

## 2022-07-02 NOTE — Assessment & Plan Note (Signed)
Strep tested positive in office.  rx for amox 500 mg po bid x 10 days.  Ibuprofen/tyelnol prn sore throat/fever Pt told to F/u if no improvement in the next 2-3 days.  

## 2022-07-02 NOTE — Progress Notes (Signed)
Established Patient Office Visit  Subjective:   Patient ID: John Kelly, male    DOB: [birth year]  Age: 57 y.o. MRN: [id number]  CC:  Chief Complaint  Patient presents with  . Shoulder Pain    Right shoulder pain with loss of strength in the right arm. Pain started Thursday morning with no injury.  . Cough    With runny nose    HPI: John Kelly is a 58 y.o. male presenting on 07/02/2022 for Shoulder Pain (Right shoulder pain with loss of strength in the right arm. Pain started Thursday morning with no injury.) and Cough (With runny nose)   Shoulder Pain   Cough   Yesterday started with runny nose, cough that is productive clear sputum, itchy eyes, and some nasal congestion. No sore throat. No ear pain. No fever or chills.  No chest congestion. Is taking zyrtec once nightly.   Right shoulder pain, yesterday about five days ago started with right posterior shoulder pain. States occurred 'out of nowhere' no trauma or injury. Now limited strength of his hand, feels bruised on his mid right forearm. Neck pian yes but this is normal for him. He had worked out his back in the gym two days prior but didn't fele anything at that moment when it occurred. Has tried extra strength tylenol, aleve, motrin without relief. He did try flexeril without much relief.        ROS: Negative unless specifically indicated above in HPI.   Relevant past medical history reviewed and updated as indicated.   Allergies and medications reviewed and updated.   Current Outpatient Medications:  .  acetaminophen (TYLENOL) 500 MG tablet, Take 1,000 mg by mouth daily as needed for moderate pain or headache., Disp: , Rfl:  .  amoxicillin (AMOXIL) 500 MG capsule, Take 1 capsule (500 mg total) by mouth 2 (two) times daily for 10 days., Disp: 20 capsule, Rfl: 0 .  Ascorbic Acid (VITAMIN C) 1000 MG tablet, Take 1,000 mg by mouth daily., Disp: , Rfl:  .  aspirin 81 MG EC tablet, Take 1 tablet (81 mg total) by  mouth daily., Disp: 30 tablet, Rfl: 11 .  baclofen (LIORESAL) 10 MG tablet, Take 10 mg by mouth 2 (two) times daily., Disp: , Rfl:  .  balsalazide (COLAZAL) 750 MG capsule, Take 3 capsules (2,250 mg total) by mouth 3 (three) times daily., Disp: 270 capsule, Rfl: 11 .  Cholecalciferol (VITAMIN D3) 5000 units CAPS, Take 5,000 Units by mouth daily., Disp: , Rfl:  .  DiphenhydrAMINE HCl, Sleep, (ZZZQUIL PO), Take 1 Dose by mouth at bedtime as needed (sleep)., Disp: , Rfl:  .  esomeprazole (NEXIUM) 20 MG packet, Take 20 mg by mouth daily., Disp: , Rfl:  .  losartan (COZAAR) 50 MG tablet, Take 1 tablet (50 mg total) by mouth daily., Disp: 90 tablet, Rfl: 3 .  mesalamine (CANASA) 1000 MG suppository, INSERT 1 SUPPOSITORY RECTALLY ONCE DAILY, Disp: 30 suppository, Rfl: 0 .  metoprolol tartrate (LOPRESSOR) 25 MG tablet, Take 1 tablet by mouth twice daily, Disp: 90 tablet, Rfl: 3 .  Misc Natural Products (OSTEO BI-FLEX TRIPLE STRENGTH PO), Take 2 tablets by mouth daily., Disp: , Rfl:  .  nitroGLYCERIN (NITROSTAT) 0.4 MG SL tablet, Place 1 tablet (0.4 mg total) under the tongue every 5 (five) minutes x 3 doses as needed for chest pain., Disp: 25 tablet, Rfl: 6 .  Nutritional Supplements (NUTRITIONAL SUPPLEMENT PO), Take by mouth. Test Max Boost- 1 tablet by mouth  three times a day, Disp: , Rfl:  .  predniSONE (STERAPRED UNI-PAK 21 TAB) 10 MG (21) TBPK tablet, Take as directed, Disp: 1 each, Rfl: 0 .  vitamin E 45 MG (100 UNITS) capsule, Take 100 Units by mouth daily., Disp: , Rfl:   Allergies  Allergen Reactions  . Diclofenac Sodium Shortness Of Breath and Swelling    respiratory difficulty  . Skelaxin [Metaxalone] Nausea And Vomiting  . Trazodone And Nefazodone Other (See Comments)     Head felt funny     Objective:   BP 138/78   Pulse 86   Temp 98.1 F (36.7 C) (Temporal)   Ht  (1.702 m)   Wt 175 lb (79.4 kg)   SpO2 97%   BMI 27.41 kg/m    Physical Exam Vitals reviewed.   Constitutional:      General: He is not in acute distress.    Appearance: Normal appearance. He is obese. He is not ill-appearing, toxic-appearing or diaphoretic.  HENT:     Head: Normocephalic.     Right Ear: Tympanic membrane normal.     Left Ear: Tympanic membrane normal.     Nose: Nose normal.     Mouth/Throat:     Mouth: Mucous membranes are moist.     Pharynx: Posterior oropharyngeal erythema present.  Eyes:     Pupils: Pupils are equal, round, and reactive to light.  Cardiovascular:     Rate and Rhythm: Normal rate and regular rhythm.  Pulmonary:     Effort: Pulmonary effort is normal.     Breath sounds: Normal breath sounds. No wheezing.  Musculoskeletal:     Right shoulder: Tenderness (tightness with spasm right edge of scapular aspect) present. Decreased range of motion (decreased strength right grip).       Arms:     Cervical back: Normal range of motion. Muscular tenderness (right posterior neck with muscle spasm) present.     Thoracic back: Spasms and tenderness present.       Back:     Comments: No lateral epicondyle tenderness Anterior upper to mid forearm with tenderness  Right shoulder + apley impingement and drop can test  Neurological:     General: No focal deficit present.     Mental Status: He is alert and oriented to person, place, and time. Mental status is at baseline.  Psychiatric:        Mood and Affect: Mood normal.        Behavior: Behavior normal.        Thought Content: Thought content normal.        Judgment: Judgment normal.    Assessment & Plan:  Cough, unspecified type -     POCT Influenza A/B  Sore throat -     POCT rapid strep A  Cervical radiculopathy Assessment & Plan: Suspected flare up  Decadron 10 mg IM in office Prednisone pak to start tomorrow.  Heat to site  Also reach out to PA-C Ms. Doy Mince for evaluation as well if no improvement  Orders: -     dexAMETHasone Sodium Phosphate -     predniSONE; Take as directed   Dispense: 1 each; Refill: 0  Strep pharyngitis Assessment & Plan: Strep tested positive in office.  rx for amox 500 mg po bid x 10 days.  Ibuprofen/tyelnol prn sore throat/fever Pt told to F/u if no improvement in the next 2-3 days.   Orders: -     Amoxicillin; Take 1 capsule (500 mg total)  by mouth 2 (two) times daily for 10 days.  Dispense: 20 capsule; Refill: 0     Follow up plan: Return for f/u pcp if no improvement in symptoms.  Mort Sawyers, FNP

## 2022-07-10 NOTE — Telephone Encounter (Signed)
Please schedule him an appointment to see me for his neck.

## 2022-07-13 ENCOUNTER — Encounter: Payer: Self-pay | Admitting: Family

## 2022-07-13 ENCOUNTER — Other Ambulatory Visit: Payer: Self-pay | Admitting: Family

## 2022-07-13 DIAGNOSIS — M5412 Radiculopathy, cervical region: Secondary | ICD-10-CM

## 2022-07-13 MED ORDER — PREDNISONE 10 MG (21) PO TBPK: ORAL_TABLET | ORAL | 0 refills | Status: DC

## 2022-07-15 NOTE — Telephone Encounter (Signed)
Sent to me in error.  PCP has addressed.

## 2022-07-16 ENCOUNTER — Ambulatory Visit: Payer: BC Managed Care – PPO | Admitting: Orthopedic Surgery

## 2022-07-17 ENCOUNTER — Ambulatory Visit: Payer: BC Managed Care – PPO | Admitting: Orthopedic Surgery

## 2022-07-26 ENCOUNTER — Ambulatory Visit: Payer: BC Managed Care – PPO | Admitting: Orthopedic Surgery

## 2022-09-01 ENCOUNTER — Ambulatory Visit
Admission: EM | Admit: 2022-09-01 | Discharge: 2022-09-01 | Disposition: A | Discharge status: Home / Self Care | Payer: BC Managed Care – PPO | Attending: Emergency Medicine | Admitting: Emergency Medicine

## 2022-09-01 DIAGNOSIS — T7840XA Allergy, unspecified, initial encounter: Secondary | ICD-10-CM | POA: Diagnosis not present

## 2022-09-01 DIAGNOSIS — Z202 Contact with and (suspected) exposure to infections with a predominantly sexual mode of transmission: Secondary | ICD-10-CM | POA: Diagnosis not present

## 2022-09-01 DIAGNOSIS — R809 Proteinuria, unspecified: Secondary | ICD-10-CM

## 2022-09-01 LAB — URINALYSIS, W/ REFLEX TO CULTURE (INFECTION SUSPECTED)
Bilirubin Urine: NEGATIVE
Glucose, UA: NEGATIVE mg/dL
Hgb urine dipstick: NEGATIVE
Ketones, ur: NEGATIVE mg/dL
Leukocytes,Ua: NEGATIVE
Nitrite: NEGATIVE
Protein, ur: 30 mg/dL — AB
RBC / HPF: NONE SEEN RBC/hpf (ref 0–5)
Specific Gravity, Urine: 1.015 (ref 1.005–1.030)
Squamous Epithelial / HPF: NONE SEEN /HPF (ref 0–5)
pH: 5.5 (ref 5.0–8.0)

## 2022-09-01 NOTE — ED Triage Notes (Signed)
Cough for a few weeks after having strep throat. In addition states he has "irritation when urinating" x past 2 weeks. Unsure if STD exposure.

## 2022-09-01 NOTE — ED Provider Notes (Addendum)
[facility]    CSN: [id number] Arrival date & time: 09/01/22  1024      History   Chief Complaint Chief Complaint  Patient presents with   Cough    HPI John Kelly is a 57 y.o. male.   57 year old male pt, John Kelly, presents to urgent care for evaluation of clear drainage, cough, irritation with urination, possible STI exposure recently. Pt denies any dysuria, fever.  States he recently went to [place] and had new sexual partner.  The history is provided by the patient. No language interpreter was used.    Past Medical History:  Diagnosis Date   Abnormal liver function test    Allergic rhinitis    Allergy    Anxiety    CAD (coronary artery disease) 09/01/2016   a.  09/01/16; NSTEMI LHC: 60-70% ostial LAD stenosis, 20% mid LAD, 30% RCA. EF 55-65%  b. 09/07/16 [place] s/p DES to oLAD   DDD (degenerative disc disease)    Depression    GERD (gastroesophageal reflux disease)    Hyperlipidemia    Hypertension    no meds   Insomnia    MS (multiple sclerosis) (HCC)    Myocardial infarction (HCC)    2018   Neuromuscular disorder (HCC)    MS   Ulcerative colitis    left sided    Patient Active Problem List   Diagnosis Date Noted   Proteinuria 09/01/2022   Possible exposure to STI 09/01/2022   Allergies 09/01/2022   Strep pharyngitis 07/02/2022   Cervical radiculopathy 07/02/2022   Lymphadenopathy of right cervical region 05/09/2022   Pre-syncope 12/19/2021   Cervical spinal stenosis 06/04/2019   High cholesterol 09/06/2016   CAD (coronary artery disease) 09/01/2016   Umbilical hernia 04/13/2016   Multiple sclerosis (HCC) 12/15/2014   DDD (degenerative disc disease), lumbar 09/22/2013   Chronic insomnia 09/22/2013   Family history of early CAD 09/22/2013   Depression, major, single episode, 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   Mild intermittent asthma 01/07/2009   Essential  hypertension, benign 04/14/2007   ALLERGIC RHINITIS 04/14/2007   GERD 04/14/2007    Past Surgical History:  Procedure Laterality Date   COLONOSCOPY     CORONARY ANGIOGRAPHY N/A 09/07/2016   Procedure: Coronary Angiography;  Surgeon: Kathleene Hazel, MD;  Location: [facility] INVASIVE CV LAB;  Service: Cardiovascular;  Laterality: N/A;   CORONARY STENT INTERVENTION N/A 09/07/2016   Procedure: Coronary Stent Intervention;  Surgeon: Kathleene Hazel, MD;  Location: [facility] INVASIVE CV LAB;  Service: Cardiovascular;  Laterality: N/A;   Inguinal Herniorrhapy     Right and left   LEFT HEART CATH AND CORONARY ANGIOGRAPHY N/A 09/01/2016   Procedure: Left Heart Cath and Coronary Angiography;  Surgeon: Lennette Bihari, MD;  [facility]AS[facility] CV LAB;  Service: Cardiovascular;  Laterality: N/A;   LEFT HEART CATH AND CORONARY ANGIOGRAPHY N/A 09/07/2016   Procedure: Left Heart Cath and Coronary Angiography;  Surgeon: Kathleene Hazel, MD;  Location: [facility] INVASIVE CV LAB;  Service: Cardiovascular;  Laterality: N/A;   PYLOROPLASTY         Home Medications    Prior to Admission medications   Medication Sig Start Date End Date Taking? Authorizing Provider  acetaminophen (TYLENOL) 500 MG tablet Take 1,000 mg by mouth daily as needed for moderate pain or headache.    [provider]  Ascorbic Acid (VITAMIN C) 1000 MG tablet Take 1,000 mg by mouth daily.  [provider]  aspirin 81 MG EC tablet Take 1 tablet (81 mg total) by mouth daily. 09/04/16   Berton Bon, NP  baclofen (LIORESAL) 10 MG tablet Take 10 mg by mouth 2 (two) times daily.    [provider]  balsalazide (COLAZAL) 750 MG capsule Take 3 capsules (2,250 mg total) by mouth 3 (three) times daily. 07/16/19   Meryl Dare, MD  Cholecalciferol (VITAMIN D3) 5000 units CAPS Take 5,000 Units by mouth daily.    [provider]  DiphenhydrAMINE HCl, Sleep, (ZZZQUIL PO) Take 1 Dose by mouth at  bedtime as needed (sleep).    [provider]  esomeprazole (NEXIUM) 20 MG packet Take 20 mg by mouth daily.    [provider]  losartan (COZAAR) 50 MG tablet Take 1 tablet (50 mg total) by mouth daily. 10/26/21   Bedsole, Amy E, MD  mesalamine (CANASA) 1000 MG suppository INSERT 1 SUPPOSITORY RECTALLY ONCE DAILY 09/02/20   Meryl Dare, MD  metoprolol tartrate (LOPRESSOR) 25 MG tablet Take 1 tablet by mouth twice daily 11/16/21   Lennette Bihari, MD  Misc Natural Products (OSTEO BI-FLEX TRIPLE STRENGTH PO) Take 2 tablets by mouth daily.    [provider]  nitroGLYCERIN (NITROSTAT) 0.4 MG SL tablet Place 1 tablet (0.4 mg total) under the tongue every 5 (five) minutes x 3 doses as needed for chest pain. 08/30/21   Ronney Asters, NP  Nutritional Supplements (NUTRITIONAL SUPPLEMENT PO) Take by mouth. Test Max Boost- 1 tablet by mouth three times a day    [provider]  predniSONE (STERAPRED UNI-PAK 21 TAB) 10 MG (21) TBPK tablet Take as directed 07/13/22   Mort Sawyers, FNP  vitamin E 45 MG (100 UNITS) capsule Take 100 Units by mouth daily.    [provider]    Family History Family History  Problem Relation Age of Onset   Coronary artery disease Mother    Heart attack Mother    Hyperlipidemia Mother    Hypertension Mother    Colitis Mother    Diabetes Father    Hyperlipidemia Father    Hypertension Father    Coronary artery disease Father    Heart attack Father    Coronary artery disease Brother    Heart attack Brother 54       4 stents, smoker   Diabetes Brother    Hypertension Brother    Hypertension Sister    Transient ischemic attack Sister    Colon cancer Neg Hx    Esophageal cancer Neg Hx    Stomach cancer Neg Hx    Rectal cancer Neg Hx     Social History Social History   Tobacco Use   Smoking status: Never   Smokeless tobacco: Never  Vaping Use   Vaping Use: Never used  Substance Use Topics   Alcohol use: Yes     Alcohol/week: 0.0 standard drinks of alcohol    Comment: rarely   Drug use: No     Allergies   Diclofenac sodium, Skelaxin [metaxalone], and Trazodone and nefazodone   Review of Systems Review of Systems  Constitutional:  Negative for fever.  HENT:  Positive for congestion and postnasal drip.   Respiratory:  Positive for cough. Negative for shortness of breath, wheezing and stridor.   Cardiovascular:  Negative for chest pain and palpitations.  All other systems reviewed and are negative.    Physical Exam Triage Vital Signs ED Triage Vitals  Enc Vitals Group  BP 09/01/22 1127 (!) 168/87     Pulse Rate 09/01/22 1127 83     Resp 09/01/22 1127 16     Temp 09/01/22 1127 98.2 F (36.8 C)     Temp Source 09/01/22 1127 Oral     SpO2 09/01/22 1127 99 %     Weight 09/01/22 1123 175 lb (79.4 kg)     Height 09/01/22 1123 5\' 7"  (1.702 m)     Head Circumference --      Peak Flow --      Pain Score 09/01/22 1123 2     Pain Loc --      Pain Edu? --      Excl. in GC? --    No data found.  Updated Vital Signs BP (!) 168/87 (BP Location: Right Arm)   Pulse 83   Temp 98.2 F (36.8 C) (Oral)   Resp 16   Ht 5\' 7"  (1.702 m)   Wt 175 lb (79.4 kg)   SpO2 99%   BMI 27.41 kg/m   Visual Acuity Right Eye Distance:   Left Eye Distance:   Bilateral Distance:    Right Eye Near:   Left Eye Near:    Bilateral Near:     Physical Exam Vitals and nursing note reviewed.  HENT:     Head: Normocephalic.     Right Ear: Tympanic membrane is retracted.     Left Ear: Tympanic membrane is retracted.     Nose: Congestion present.     Comments: Clear PND    Mouth/Throat:     Lips: Pink.     Mouth: Mucous membranes are moist.     Pharynx: Oropharynx is clear.  Eyes:     General: Lids are everted, no foreign bodies appreciated.     Extraocular Movements: Extraocular movements intact.     Pupils: Pupils are equal, round, and reactive to light.  Neck:     Trachea: Trachea normal.   Cardiovascular:     Rate and Rhythm: Normal rate and regular rhythm.     Pulses: Normal pulses.     Heart sounds: Normal heart sounds.  Pulmonary:     Effort: Pulmonary effort is normal.     Breath sounds: Normal breath sounds and air entry.  Musculoskeletal:     Cervical back: Normal range of motion.  Neurological:     General: No focal deficit present.     Mental Status: He is alert and oriented to person, place, and time.     GCS: GCS eye subscore is 4. GCS verbal subscore is 5. GCS motor subscore is 6.  Psychiatric:        Attention and Perception: Attention normal.        Mood and Affect: Mood normal.        Speech: Speech normal.        Behavior: Behavior normal.      UC Treatments / Results  Labs (all labs ordered are listed, but only abnormal results are displayed) Labs Reviewed  URINALYSIS, W/ REFLEX TO CULTURE (INFECTION SUSPECTED) - Abnormal; Notable for the following components:      Result Value   Protein, ur 30 (*)    Bacteria, UA RARE (*)    All other components within normal limits  CYTOLOGY, (ORAL, ANAL, URETHRAL) ANCILLARY ONLY    EKG   Radiology No results found.  Procedures Procedures (including critical care time)  Medications Ordered in UC Medications - No data to display  Initial Impression /  Assessment and Plan / UC Course  I have reviewed the triage vital signs and the nursing notes.  Pertinent labs & imaging results that were available during my care of the patient were reviewed by me and considered in my medical decision making (see chart for details).    Discussed exam findings,  plan of care, patient verbalized understanding to this provider.  Ddx: Possible STI exposure, proteinuria, allergy Final Clinical Impressions(s) / UC Diagnoses   Final diagnoses:  Proteinuria, unspecified type  Possible exposure to STI  Allergy, initial encounter     Discharge Instructions      Your UA is negative for UTI , you have some protein  in your urine please follow-up with PCP regarding this Alexis Goodell your urine rechecked .Rest,push fluids, take OTC allergy med of choice, may use flonase nasal spray as directed,Take Coricidin HBP for symptom mnagement. Take home meds as prescribed. Follow up with PCP.  Check MyChart for your results, you will be notified if your test is positive and further treatment is needed.  Safe sex practices without future activity.   If you develop chest pain, shortness of breath or difficulty breathing go to the ER for further evaluation.     ED Prescriptions   None    PDMP not reviewed this encounter.   Clancy Gourd, NP 09/01/22 1358    Clancy Gourd, NP 09/01/22 1400

## 2022-09-01 NOTE — Discharge Instructions (Addendum)
Your UA is negative for UTI , you have some protein in your urine please follow-up with PCP regarding this John Kelly your urine rechecked .Rest,push fluids, take OTC allergy med of choice, may use flonase nasal spray as directed,Take Coricidin HBP for symptom mnagement. Take home meds as prescribed. Follow up with PCP.  Check MyChart for your results, you will be notified if your test is positive and further treatment is needed.  Safe sex practices without future activity.   If you develop chest pain, shortness of breath or difficulty breathing go to the ER for further evaluation.

## 2022-09-03 LAB — CYTOLOGY, (ORAL, ANAL, URETHRAL) ANCILLARY ONLY
Chlamydia: NEGATIVE
Comment: NEGATIVE
Comment: NEGATIVE
Comment: NORMAL
Neisseria Gonorrhea: NEGATIVE
Trichomonas: NEGATIVE

## 2022-09-04 DIAGNOSIS — M48061 Spinal stenosis, lumbar region without neurogenic claudication: Secondary | ICD-10-CM

## 2022-09-04 DIAGNOSIS — M4316 Spondylolisthesis, lumbar region: Secondary | ICD-10-CM

## 2022-09-04 DIAGNOSIS — M5416 Radiculopathy, lumbar region: Secondary | ICD-10-CM

## 2022-09-05 NOTE — Addendum Note (Signed)
Addended byDrake Leach on: 09/05/2022 07:52 AM   Modules accepted: Orders

## 2022-09-06 ENCOUNTER — Emergency Department (HOSPITAL_COMMUNITY)
Admission: EM | Admit: 2022-09-06 | Discharge: 2022-09-06 | Disposition: A | Discharge status: Home / Self Care | Payer: BC Managed Care – PPO | Attending: Emergency Medicine | Admitting: Emergency Medicine

## 2022-09-06 ENCOUNTER — Other Ambulatory Visit: Payer: Self-pay

## 2022-09-06 ENCOUNTER — Encounter (HOSPITAL_COMMUNITY): Payer: Self-pay

## 2022-09-06 ENCOUNTER — Emergency Department (HOSPITAL_COMMUNITY): Payer: BC Managed Care – PPO

## 2022-09-06 ENCOUNTER — Telehealth: Payer: Self-pay | Admitting: Family Medicine

## 2022-09-06 DIAGNOSIS — Z79899 Other long term (current) drug therapy: Secondary | ICD-10-CM | POA: Insufficient documentation

## 2022-09-06 DIAGNOSIS — R079 Chest pain, unspecified: Secondary | ICD-10-CM | POA: Diagnosis present

## 2022-09-06 DIAGNOSIS — Z7982 Long term (current) use of aspirin: Secondary | ICD-10-CM | POA: Diagnosis not present

## 2022-09-06 DIAGNOSIS — I2510 Atherosclerotic heart disease of native coronary artery without angina pectoris: Secondary | ICD-10-CM | POA: Diagnosis not present

## 2022-09-06 DIAGNOSIS — I10 Essential (primary) hypertension: Secondary | ICD-10-CM | POA: Diagnosis not present

## 2022-09-06 DIAGNOSIS — I1A0 Resistant hypertension: Secondary | ICD-10-CM

## 2022-09-06 LAB — BASIC METABOLIC PANEL
Anion gap: 8 (ref 5–15)
BUN: 10 mg/dL (ref 6–20)
CO2: 25 mmol/L (ref 22–32)
Calcium: 8.9 mg/dL (ref 8.9–10.3)
Chloride: 104 mmol/L (ref 98–111)
Creatinine, Ser: 1.02 mg/dL (ref 0.61–1.24)
GFR, Estimated: >60 mL/min (ref >60)
Glucose, Bld: 102 mg/dL — ABNORMAL HIGH (ref 70–99)
Potassium: 4.1 mmol/L (ref 3.5–5.1)
Sodium: 137 mmol/L (ref 135–145)

## 2022-09-06 LAB — CBC
HCT: 41.1 % (ref 39.0–52.0)
Hemoglobin: 13.7 g/dL (ref 13.0–17.0)
MCH: 31.1 pg (ref 26.0–34.0)
MCHC: 33.3 g/dL (ref 30.0–36.0)
MCV: 93.4 fL (ref 80.0–100.0)
Platelets: 173 10*3/uL (ref 150–400)
RBC: 4.4 MIL/uL (ref 4.22–5.81)
RDW: 11.3 % — ABNORMAL LOW (ref 11.5–15.5)
WBC: 5.9 10*3/uL (ref 4.0–10.5)
nRBC: 0 % (ref 0.0–0.2)

## 2022-09-06 LAB — TROPONIN I (HIGH SENSITIVITY)
Troponin I (High Sensitivity): 4 ng/L (ref <18)
Troponin I (High Sensitivity): 4 ng/L (ref <18)

## 2022-09-06 NOTE — Telephone Encounter (Signed)
FYI: This call has been transferred to Access Nurse. Once the result note has been entered staff can address the message at that time.  Patient called in with the following symptoms:  Red Word: elevated b/p,lightheadedness   Please advise at [facility] [phone]  Message is routed to Provider Pool and [facility] Triage

## 2022-09-06 NOTE — Telephone Encounter (Signed)
Sending note to Dr Bedsole and Bedsole pool. 

## 2022-09-06 NOTE — Discharge Instructions (Signed)
You are seen in the emergency department for elevated blood pressure and intermittent chest pain.  You had blood work EKG and chest x-ray that did not show any obvious explanation for your symptoms.  Your blood pressure was elevated here and this will need to be followed up with your primary care doctor.  Please continue your regular medications.  You may consider increasing your losartan to 100 mg daily if your blood pressures are persistently elevated.  Keep well-hydrated.  Return to the emergency department if any worsening or concerning symptoms.

## 2022-09-06 NOTE — ED Provider Notes (Signed)
[place] EMERGENCY DEPARTMENT AT [facility] Provider Note   CSN: [id number] Arrival date & time: 09/06/22  1630     History  Chief Complaint  Patient presents with   Chest Pain   Hypertension   Shortness of Breath   Dizziness   Headache    John Kelly is a 57 y.o. male.  He has a history of hypertension and coronary disease, had a stent in the past.  That episode with some pain in his back.  He said for the past 2 weeks his blood pressure has been elevated when he checks it, getting 170s for systolic.  He is also had some intermittent pressure in his chest and some intermittent sharp stabbing pain.  Not associated with diaphoresis.  He was having some allergy symptoms recently went to urgent care with a cough.  Has an appointment with his primary care doctor coming up on Monday but when he called him again and to try to get in sooner they recommended he come here to get his heart checked.  He is doing some over-the-counter supplements for his workouts but they are not new.   The history is provided by the patient.  Chest Pain Pain location:  L chest and substernal area Pain quality: pressure and sharp   Onset quality:  Gradual Duration:  2 weeks Timing:  Intermittent Progression:  Unchanged Chronicity:  New Context: not trauma   Relieved by:  Nothing Worsened by:  Nothing Ineffective treatments:  Nitroglycerin Associated symptoms: cough, dizziness, headache and shortness of breath   Associated symptoms: no fever, no lower extremity edema, no nausea and no vomiting   Risk factors: coronary artery disease   Hypertension Associated symptoms include chest pain, headaches and shortness of breath.  Shortness of Breath Associated symptoms: chest pain, cough and headaches   Associated symptoms: no fever and no vomiting   Dizziness Associated symptoms: chest pain, headaches and shortness of breath   Associated symptoms: no nausea and no vomiting    Headache Associated symptoms: cough and dizziness   Associated symptoms: no fever, no nausea and no vomiting        Home Medications Prior to Admission medications   Medication Sig Start Date End Date Taking? Authorizing Provider  acetaminophen (TYLENOL) 500 MG tablet Take 1,000 mg by mouth daily as needed for moderate pain or headache.    [provider]  Ascorbic Acid (VITAMIN C) 1000 MG tablet Take 1,000 mg by mouth daily.    [provider]  aspirin 81 MG EC tablet Take 1 tablet (81 mg total) by mouth daily. 09/04/16   Berton Bon, NP  baclofen (LIORESAL) 10 MG tablet Take 10 mg by mouth 2 (two) times daily.    [provider]  balsalazide (COLAZAL) 750 MG capsule Take 3 capsules (2,250 mg total) by mouth 3 (three) times daily. 07/16/19   Meryl Dare, MD  Cholecalciferol (VITAMIN D3) 5000 units CAPS Take 5,000 Units by mouth daily.    [provider]  DiphenhydrAMINE HCl, Sleep, (ZZZQUIL PO) Take 1 Dose by mouth at bedtime as needed (sleep).    [provider]  esomeprazole (NEXIUM) 20 MG packet Take 20 mg by mouth daily.    [provider]  losartan (COZAAR) 50 MG tablet Take 1 tablet (50 mg total) by mouth daily. 10/26/21   Bedsole, Amy E, MD  mesalamine (CANASA) 1000 MG suppository INSERT 1 SUPPOSITORY RECTALLY ONCE DAILY 09/02/20   Meryl Dare, MD  metoprolol  tartrate (LOPRESSOR) 25 MG tablet Take 1 tablet by mouth twice daily 11/16/21   Lennette Bihari, MD  Misc Natural Products (OSTEO BI-FLEX TRIPLE STRENGTH PO) Take 2 tablets by mouth daily.    [provider]  nitroGLYCERIN (NITROSTAT) 0.4 MG SL tablet Place 1 tablet (0.4 mg total) under the tongue every 5 (five) minutes x 3 doses as needed for chest pain. 08/30/21   Ronney Asters, NP  Nutritional Supplements (NUTRITIONAL SUPPLEMENT PO) Take by mouth. Test Max Boost- 1 tablet by mouth three times a day    [provider]  predniSONE (STERAPRED  UNI-PAK 21 TAB) 10 MG (21) TBPK tablet Take as directed 07/13/22   Mort Sawyers, FNP  vitamin E 45 MG (100 UNITS) capsule Take 100 Units by mouth daily.    [provider]      Allergies    Diclofenac sodium, Skelaxin [metaxalone], and Trazodone and nefazodone    Review of Systems   Review of Systems  Constitutional:  Negative for fever.  Respiratory:  Positive for cough and shortness of breath.   Cardiovascular:  Positive for chest pain.  Gastrointestinal:  Negative for nausea and vomiting.  Neurological:  Positive for dizziness and headaches.    Physical Exam Updated Vital Signs BP (!) 170/85 (BP Location: Right Arm)   Pulse 79   Temp 99.7 F (37.6 C)   Resp 15   SpO2 98%  Physical Exam Vitals and nursing note reviewed.  Constitutional:      General: He is not in acute distress.    Appearance: Normal appearance. He is well-developed.  HENT:     Head: Normocephalic and atraumatic.  Eyes:     Conjunctiva/sclera: Conjunctivae normal.  Cardiovascular:     Rate and Rhythm: Normal rate and regular rhythm.     Heart sounds: Normal heart sounds. No murmur heard. Pulmonary:     Effort: Pulmonary effort is normal. No respiratory distress.     Breath sounds: Normal breath sounds.  Abdominal:     Palpations: Abdomen is soft.     Tenderness: There is no abdominal tenderness.  Musculoskeletal:        General: No swelling. Normal range of motion.     Cervical back: Neck supple.     Right lower leg: No tenderness. No edema.     Left lower leg: No tenderness. No edema.  Skin:    General: Skin is warm and dry.     Capillary Refill: Capillary refill takes less than 2 seconds.  Neurological:     General: No focal deficit present.     Mental Status: He is alert.     Gait: Gait normal.     ED Results / Procedures / Treatments   Labs (all labs ordered are listed, but only abnormal results are displayed) Labs Reviewed  BASIC METABOLIC PANEL - Abnormal; Notable for the  following components:      Result Value   Glucose, Bld 102 (*)    All other components within normal limits  CBC - Abnormal; Notable for the following components:   RDW 11.3 (*)    All other components within normal limits  TROPONIN I (HIGH SENSITIVITY)  TROPONIN I (HIGH SENSITIVITY)    EKG EKG Interpretation  Date/Time:  Thursday September 06 2022 17:05:41 EDT Ventricular Rate:  76 PR Interval:  140 QRS Duration: 96 QT Interval:  378 QTC Calculation: 425 R Axis:   58 Text Interpretation: Normal sinus rhythm Normal ECG When compared with  ECG of 24-Jan-2021 19:37, No significant change since last tracing Confirmed by Meridee Score [phone]) on 09/06/2022 9:09:57 PM  Radiology DG Chest 2 View  Result Date: 09/06/2022 CLINICAL DATA:  CP EXAM: CHEST - 2 VIEW COMPARISON:  Chest x-ray January 24, 2021. FINDINGS: The heart size and mediastinal contours are within normal limits. Both lungs are clear. No visible pleural effusions or pneumothorax. No acute osseous abnormality. IMPRESSION: No active cardiopulmonary disease. Electronically Signed   By: Feliberto Harts M.D.   On: 09/06/2022 17:30    Procedures Procedures    Medications Ordered in ED Medications - No data to display  ED Course/ Medical Decision Making/ A&P                             Medical Decision Making Amount and/or Complexity of Data Reviewed Labs: ordered. Radiology: ordered.   This patient complains of elevated blood pressure chest pain; this involves an extensive number of treatment Options and is a complaint that carries with it a high risk of complications and morbidity. The differential includes hypertensive urgency, hypertensive emergency, ACS, vascular, PE  I ordered, reviewed and interpreted labs, which included CBC normal chemistries normal troponins flat I ordered imaging studies which included chest x-ray and I independently    visualized and interpreted imaging which showed no acute  findings Previous records obtained and reviewed in epic including prior cardiology notes Cardiac monitoring reviewed, normal sinus rhythm Social determinants considered, no significant barriers Critical Interventions: None  After the interventions stated above, I reevaluated the patient and found patient to be pain-free and blood pressure trending down Admission and further testing considered, no indication for admission or further workup at this time.  Will have patient continue his regular medications and follow-up with his PCP for further evaluation of elevated blood pressure and chest pain.  Return instructions discussed.         Final Clinical Impression(s) / ED Diagnoses Final diagnoses:  Resistant hypertension  Nonspecific chest pain    Rx / DC Orders ED Discharge Orders     None         Terrilee Files, MD 09/07/22 [phone]

## 2022-09-06 NOTE — ED Triage Notes (Signed)
Pt came in via POV d/t HTN that started the last 2 weeks & he reports it was 179/91 & he was having sharp-Lt sided CP rated 4/10 (does have Hx of stent) today so he came in for eval. Denies any pain radiation, did have some SOB, HA & dizziness.

## 2022-09-07 ENCOUNTER — Telehealth: Payer: Self-pay

## 2022-09-07 NOTE — Telephone Encounter (Signed)
Noted  

## 2022-09-07 NOTE — Transitions of Care (Post Inpatient/ED Visit) (Signed)
09/07/2022  Name: John Kelly MRN: [id number] DOB: 09/15/1965  Today's TOC FU Call Status: Today's TOC FU Call Status:: Successful TOC FU Call Competed TOC FU Call Complete Date: 09/07/22  Transition Care Management Follow-up Telephone Call Date of Discharge: 09/06/22 Discharge Facility: [facility]) Type of Discharge: Emergency Department Reason for ED Visit: Other: (HTN) How have you been since you were released from the hospital?: Better Any questions or concerns?: No  Items Reviewed: Did you receive and understand the discharge instructions provided?: Yes Medications obtained,verified, and reconciled?: No Medications Not Reviewed Reasons:: Other: (just woke up) Any new allergies since your discharge?: No Dietary orders reviewed?: NA Do you have support at home?: Yes  Medications Reviewed Today: Medications Reviewed Today     Reviewed by Ferdie Ping, RN (Registered Nurse) on 09/01/22 at 1125  Med List Status: <None>   Medication Order Taking? Sig Documenting Provider Last Dose Status Informant  acetaminophen (TYLENOL) 500 MG tablet [id number]  Take 1,000 mg by mouth daily as needed for moderate pain or headache. [provider]  Active Self  Ascorbic Acid (VITAMIN C) 1000 MG tablet [id number]  Take 1,000 mg by mouth daily. [provider]  Active   aspirin 81 MG EC tablet [id number]  Take 1 tablet (81 mg total) by mouth daily. Berton Bon, NP  Consider Medication Status and Discontinue (No longer needed (for PRN medications)) Self  baclofen (LIORESAL) 10 MG tablet [id number]  Take 10 mg by mouth 2 (two) times daily. [provider]  Active   balsalazide (COLAZAL) 750 MG capsule [id number]  Take 3 capsules (2,250 mg total) by mouth 3 (three) times daily. Meryl Dare, MD  Active   Cholecalciferol (VITAMIN D3) 5000 units CAPS [id number]  Take 5,000 Units by mouth daily. [provider]  Active Self  DiphenhydrAMINE HCl, Sleep,  (ZZZQUIL PO) [id number]  Take 1 Dose by mouth at bedtime as needed (sleep). [provider]  Active Self  esomeprazole (NEXIUM) 20 MG packet [id number]  Take 20 mg by mouth daily. [provider]  Active   losartan (COZAAR) 50 MG tablet [id number]  Take 1 tablet (50 mg total) by mouth daily. Excell Seltzer, MD  Active   mesalamine (CANASA) 1000 MG suppository [id number]  INSERT 1 SUPPOSITORY RECTALLY ONCE DAILY Meryl Dare, MD  Active   metoprolol tartrate (LOPRESSOR) 25 MG tablet [id number]  Take 1 tablet by mouth twice daily Lennette Bihari, MD  Active   Misc Natural Products (OSTEO BI-FLEX TRIPLE STRENGTH PO) [id number]  Take 2 tablets by mouth daily. [provider]  Active   nitroGLYCERIN (NITROSTAT) 0.4 MG SL tablet [id number]  Place 1 tablet (0.4 mg total) under the tongue every 5 (five) minutes x 3 doses as needed for chest pain. Ronney Asters, NP  Active   Nutritional Supplements (NUTRITIONAL SUPPLEMENT PO) [id number]  Take by mouth. Test Max Boost- 1 tablet by mouth three times a day [provider]  Active   predniSONE (STERAPRED UNI-PAK 21 TAB) 10 MG (21) TBPK tablet [id number]  Take as directed Mort Sawyers, FNP  Consider Medication Status and Discontinue (No longer needed (for PRN medications))   vitamin E 45 MG (100 UNITS) capsule [id number]  Take 100 Units by mouth daily. [provider]  Active             Home Care and Equipment/Supplies: Were Home Health Services Ordered?: NA Any new equipment or medical supplies ordered?: NA  Functional Questionnaire:  Do you need assistance with bathing/showering or dressing?: No Do you need assistance with meal preparation?: No Do you need assistance with eating?: No Do you have difficulty maintaining continence: No Do you need assistance with getting out of bed/getting out of a chair/moving?: No Do you have difficulty managing or taking your medications?: No  Follow up appointments  reviewed: PCP Follow-up appointment confirmed?: Yes Date of PCP follow-up appointment?: 09/11/22 Follow-up Provider: Dr. Ermalene Searing Specialist [facility] Follow-up appointment confirmed?: NA Do you need transportation to your follow-up appointment?: No Do you understand care options if your condition(s) worsen?: Yes-patient verbalized understanding    SIGNATURE Elisha Ponder LPN [facility] AW[facility] Team Direct dial:  [phone]

## 2022-09-11 ENCOUNTER — Ambulatory Visit: Payer: BC Managed Care – PPO | Admitting: Family Medicine

## 2022-09-11 VITALS — BP 140/72 | HR 62 | Temp 98.2°F | Ht 67.0 in | Wt 173.0 lb

## 2022-09-11 DIAGNOSIS — R809 Proteinuria, unspecified: Secondary | ICD-10-CM

## 2022-09-11 DIAGNOSIS — I10 Essential (primary) hypertension: Secondary | ICD-10-CM | POA: Diagnosis not present

## 2022-09-11 DIAGNOSIS — R42 Dizziness and giddiness: Secondary | ICD-10-CM | POA: Insufficient documentation

## 2022-09-11 LAB — POC URINALSYSI DIPSTICK (AUTOMATED)
Bilirubin, UA: NEGATIVE
Blood, UA: NEGATIVE
Glucose, UA: NEGATIVE
Ketones, UA: NEGATIVE
Leukocytes, UA: NEGATIVE
Nitrite, UA: NEGATIVE
Protein, UA: NEGATIVE
Spec Grav, UA: 1.01 (ref 1.010–1.025)
Urobilinogen, UA: 0.2 E.U./dL
pH, UA: 8 (ref 5.0–8.0)

## 2022-09-11 MED ORDER — LOSARTAN POTASSIUM 100 MG PO TABS: 100.0000 mg | ORAL_TABLET | Freq: Every day | ORAL | 3 refills | Status: DC

## 2022-09-11 NOTE — Assessment & Plan Note (Signed)
Chronic, intermittent.  Possibly related to MS.  Possible eustachian tube dysfunction with recent upper respiratory tract infection.  Can try Flonase 2 sprays per nostril daily to help fluid drained from middle ear.

## 2022-09-11 NOTE — Patient Instructions (Signed)
Increase losartan to 100 mg daily.  Avoid decongestants.  Can use flonase 2 sprays per nostril daily for any fluid behind ear drums.  Follow BP at home goal < 140/90.

## 2022-09-11 NOTE — Assessment & Plan Note (Signed)
Urinalysis in office today clear showing no sign of infection and no protein in urine.  Likely previous test was contaminated

## 2022-09-11 NOTE — Assessment & Plan Note (Signed)
Chronic, unclear cause of progressive elevation of blood pressure.  Possibly related to recent decongestant use although he has not used in a while.  He has been sick with upper respiratory infection recently. Workup in the ER unremarkable.  Only additional workup needed would be thyroid testing.  Blood pressure is improved with losartan 75 mg daily but not at goal.  Will increase losartan to 100 mg p.o. daily and recheck basic metabolic panel in 7 to 10 days.

## 2022-09-11 NOTE — Progress Notes (Signed)
Patient ID: John Kelly, male    DOB: 24-Jul-1965, 57 y.o.   MRN: [id number]  This visit was conducted in person.  BP (!) 140/72 (BP Location: Right Arm, Patient Position: Sitting, Cuff Size: Normal)   Pulse 62   Temp 98.2 F (36.8 C) (Temporal)   Ht 5\' 7"  (1.702 m)   Wt 173 lb (78.5 kg)   SpO2 95%   BMI 27.10 kg/m    CC:  Chief Complaint  Patient presents with   Proteinuria    Seen at [facility] and was told he had protein in his urine.   Advised to follow up with PCP.    Hypertension    Seen in ED on 09/06/22    Subjective:   HPI: John Kelly is a 57 y.o. male presenting on 09/11/2022 for Proteinuria (Seen at [facility] and was told he had protein in his urine.   Advised to follow up with PCP. ) and Hypertension (Seen in ED on 09/06/22)  Reviewed ED visit for resistant hypertension, shortness of breath, chest pain, dizziness and headache on September 06, 2022 CBC, troponins, complete metabolic panel unremarkable.  Chest x-ray negative  Hypertension:  Inadequate control  but better since increasing losartan 75 mg daily, metoprolol 25 mg p.o. twice daily  BP Readings from Last 3 Encounters:  09/11/22 (!) 140/72  09/06/22 (!) 152/87  09/01/22 (!) 168/87  Using medication without problems or lightheadedness:  yes, fatigue none further Chest pain with exertion: non further Edema: none Short of breath: none further Average home BPs: 145/85 last night Other issues:   Proteinuria, noted : UA in office today negative.     Relevant past medical, surgical, family and social history reviewed and updated as indicated. Interim medical history since our last visit reviewed. Allergies and medications reviewed and updated. Outpatient Medications Prior to Visit  Medication Sig Dispense Refill   acetaminophen (TYLENOL) 500 MG tablet Take 1,000 mg by mouth daily as needed for moderate pain or headache.     Ascorbic Acid (VITAMIN C) 1000 MG tablet Take 1,000 mg by mouth daily.     baclofen (LIORESAL) 10  MG tablet Take 10 mg by mouth 2 (two) times daily.     balsalazide (COLAZAL) 750 MG capsule Take 3 capsules (2,250 mg total) by mouth 3 (three) times daily. 270 capsule 11   Cholecalciferol (VITAMIN D3) 5000 units CAPS Take 5,000 Units by mouth daily.     DiphenhydrAMINE HCl, Sleep, (ZZZQUIL PO) Take 1 Dose by mouth at bedtime as needed (sleep).     esomeprazole (NEXIUM) 20 MG packet Take 20 mg by mouth daily.     mesalamine (CANASA) 1000 MG suppository INSERT 1 SUPPOSITORY RECTALLY ONCE DAILY 30 suppository 0   metoprolol tartrate (LOPRESSOR) 25 MG tablet Take 1 tablet by mouth twice daily 90 tablet 3   Misc Natural Products (OSTEO BI-FLEX TRIPLE STRENGTH PO) Take 2 tablets by mouth daily.     nitroGLYCERIN (NITROSTAT) 0.4 MG SL tablet Place 1 tablet (0.4 mg total) under the tongue every 5 (five) minutes x 3 doses as needed for chest pain. 25 tablet 6   Nutritional Supplements (NUTRITIONAL SUPPLEMENT PO) Take by mouth. Test Max Boost- 1 tablet by mouth three times a day     vitamin E 45 MG (100 UNITS) capsule Take 100 Units by mouth daily.     losartan (COZAAR) 50 MG tablet Take 1 tablet (50 mg total) by mouth daily. 90 tablet 3   aspirin 81 MG  EC tablet Take 1 tablet (81 mg total) by mouth daily. 30 tablet 11   predniSONE (STERAPRED UNI-PAK 21 TAB) 10 MG (21) TBPK tablet Take as directed 1 each 0   No facility-administered medications prior to visit.     Per HPI unless specifically indicated in ROS section below Review of Systems  Constitutional:  Negative for fatigue and fever.  HENT:  Positive for ear pain.   Eyes:  Negative for pain.  Respiratory:  Negative for cough and shortness of breath.   Cardiovascular:  Negative for chest pain, palpitations and leg swelling.  Gastrointestinal:  Negative for abdominal pain.  Genitourinary:  Negative for dysuria.  Musculoskeletal:  Negative for arthralgias.  Neurological:  Negative for syncope, light-headedness and headaches.   Psychiatric/Behavioral:  Negative for dysphoric mood.    Objective:  BP (!) 140/72 (BP Location: Right Arm, Patient Position: Sitting, Cuff Size: Normal)   Pulse 62   Temp 98.2 F (36.8 C) (Temporal)   Ht 5\' 7"  (1.702 m)   Wt 173 lb (78.5 kg)   SpO2 95%   BMI 27.10 kg/m   Wt Readings from Last 3 Encounters:  09/11/22 173 lb (78.5 kg)  09/06/22 175 lb 0.7 oz (79.4 kg)  09/01/22 175 lb (79.4 kg)      Physical Exam Constitutional:      Appearance: He is well-developed.  HENT:     Head: Normocephalic.     Right Ear: Hearing normal.     Left Ear: Hearing normal.     Nose: Nose normal.  Neck:     Thyroid: No thyroid mass or thyromegaly.     Vascular: No carotid bruit.     Trachea: Trachea normal.  Cardiovascular:     Rate and Rhythm: Normal rate and regular rhythm.     Pulses: Normal pulses.     Heart sounds: Heart sounds not distant. No murmur heard.    No friction rub. No gallop.     Comments: No peripheral edema Pulmonary:     Effort: Pulmonary effort is normal. No respiratory distress.     Breath sounds: Normal breath sounds.  Skin:    General: Skin is warm and dry.     Findings: No rash.  Psychiatric:        Speech: Speech normal.        Behavior: Behavior normal.        Thought Content: Thought content normal.       Results for orders placed or performed in visit on 09/11/22  POCT Urinalysis Dipstick (Automated)  Result Value Ref Range   Color, UA Yellow    Clarity, UA Clear    Glucose, UA Negative Negative   Bilirubin, UA Negative    Ketones, UA Negative    Spec Grav, UA 1.010 1.010 - 1.025   Blood, UA Negative    pH, UA 8.0 5.0 - 8.0   Protein, UA Negative Negative   Urobilinogen, UA 0.2 0.2 or 1.0 E.U./dL   Nitrite, UA Negative    Leukocytes, UA Negative Negative    Assessment and Plan  Proteinuria, unspecified type Assessment & Plan: Urinalysis in office today clear showing no sign of infection and no protein in urine.  Likely previous test  was contaminated  Orders: -     POCT Urinalysis Dipstick (Automated)  Hypertension, unspecified type -     Basic metabolic panel; Future -     TSH; Future  Dizziness Assessment & Plan: Chronic, intermittent.  Possibly related to MS.  Possible eustachian tube dysfunction with recent upper respiratory tract infection.  Can try Flonase 2 sprays per nostril daily to help fluid drained from middle ear.   Essential hypertension, benign Assessment & Plan: Chronic, unclear cause of progressive elevation of blood pressure.  Possibly related to recent decongestant use although he has not used in a while.  He has been sick with upper respiratory infection recently. Workup in the ER unremarkable.  Only additional workup needed would be thyroid testing.  Blood pressure is improved with losartan 75 mg daily but not at goal.  Will increase losartan to 100 mg p.o. daily and recheck basic metabolic panel in 7 to 10 days.   Other orders -     Losartan Potassium; Take 1 tablet (100 mg total) by mouth daily.  Dispense: 90 tablet; Refill: 3    Return in about 2 years (around 09/10/2024) for lab only appt for BMET.   John Nora, MD

## 2022-09-13 ENCOUNTER — Ambulatory Visit: Payer: BC Managed Care – PPO | Admitting: Family Medicine

## 2022-09-18 ENCOUNTER — Ambulatory Visit
Admission: RE | Admit: 2022-09-18 | Discharge: 2022-09-18 | Disposition: A | Discharge status: Home / Self Care | Payer: BC Managed Care – PPO | Source: Ambulatory Visit | Attending: Orthopedic Surgery | Admitting: Orthopedic Surgery

## 2022-09-18 DIAGNOSIS — M4316 Spondylolisthesis, lumbar region: Secondary | ICD-10-CM

## 2022-09-18 DIAGNOSIS — M5416 Radiculopathy, lumbar region: Secondary | ICD-10-CM

## 2022-09-18 DIAGNOSIS — M48061 Spinal stenosis, lumbar region without neurogenic claudication: Secondary | ICD-10-CM

## 2022-09-18 MED ORDER — IOPAMIDOL (ISOVUE-M 200) INJECTION 41%
1.0000 mL | Freq: Once | INTRAMUSCULAR | Status: AC
  Administered 2022-09-18: 1 mL via EPIDURAL

## 2022-09-18 MED ORDER — METHYLPREDNISOLONE ACETATE 40 MG/ML INJ SUSP (RADIOLOG
80.0000 mg | Freq: Once | INTRAMUSCULAR | Status: AC
  Administered 2022-09-18: 80 mg via EPIDURAL

## 2022-09-18 NOTE — Discharge Instructions (Signed)
Post Procedure Spinal Discharge Instruction Sheet  You may resume a regular diet and any medications that you routinely take (including pain medications).  No driving day of procedure.  Light activity throughout the rest of the day.  Do not do any strenuous work, exercise, bending or lifting.  The day following the procedure, you can resume normal physical activity but you should refrain from exercising or physical therapy for at least three days thereafter.   Common Side Effects:  Headaches- take your usual medications as directed by your physician.  Increase your fluid intake.  Caffeinated beverages may be helpful.  Lie flat in bed until your headache resolves.  Restlessness or inability to sleep- you may have trouble sleeping for the next few days.  Ask your referring physician if you need any medication for sleep.  Facial flushing or redness- should subside within a few days.  Increased pain- a temporary increase in pain a day or two following your procedure is not unusual.  Take your pain medication as prescribed by your referring physician.  Leg cramps  Please contact our office at [phone] for the following symptoms: Fever greater than 100 degrees. Headaches unresolved with medication after 2-3 days. Increased swelling, pain, or redness at injection site.

## 2022-09-21 ENCOUNTER — Other Ambulatory Visit (INDEPENDENT_AMBULATORY_CARE_PROVIDER_SITE_OTHER): Payer: BC Managed Care – PPO

## 2022-09-21 DIAGNOSIS — I10 Essential (primary) hypertension: Secondary | ICD-10-CM

## 2022-09-21 NOTE — Addendum Note (Signed)
Addended by: Alvina Chou on: 09/21/2022 02:06 PM   Modules accepted: Orders

## 2022-09-22 LAB — BASIC METABOLIC PANEL
BUN: 9 mg/dL (ref 7–25)
CO2: 22 mmol/L (ref 20–32)
Calcium: 8.9 mg/dL (ref 8.6–10.3)
Chloride: 104 mmol/L (ref 98–110)
Creat: 1.02 mg/dL (ref 0.70–1.30)
Glucose, Bld: 100 mg/dL — ABNORMAL HIGH (ref 65–99)
Potassium: 4 mmol/L (ref 3.5–5.3)
Sodium: 140 mmol/L (ref 135–146)

## 2022-09-22 LAB — TSH: TSH: 1.14 mIU/L (ref 0.40–4.50)

## 2022-11-05 ENCOUNTER — Ambulatory Visit: Payer: BC Managed Care – PPO | Admitting: Orthopedic Surgery

## 2022-11-06 ENCOUNTER — Ambulatory Visit: Payer: BC Managed Care – PPO | Admitting: Orthopedic Surgery

## 2022-11-12 ENCOUNTER — Encounter: Payer: Self-pay | Admitting: Family Medicine

## 2022-11-14 ENCOUNTER — Other Ambulatory Visit: Payer: Self-pay | Admitting: Family Medicine

## 2022-11-14 MED ORDER — TRAMADOL HCL 50 MG PO TABS: 50.0000 mg | ORAL_TABLET | Freq: Three times a day (TID) | ORAL | 0 refills | Status: DC | PRN

## 2022-11-14 MED ORDER — ZOLPIDEM TARTRATE 5 MG PO TABS: 5.0000 mg | ORAL_TABLET | Freq: Every evening | ORAL | 0 refills | Status: DC | PRN

## 2022-11-15 MED ORDER — TRAMADOL HCL 50 MG PO TABS: 50.0000 mg | ORAL_TABLET | Freq: Three times a day (TID) | ORAL | 0 refills | Status: DC | PRN

## 2022-11-15 NOTE — Addendum Note (Signed)
Addended byKerby Nora E on: 11/15/2022 02:06 PM   Modules accepted: Orders

## 2022-12-04 ENCOUNTER — Other Ambulatory Visit: Payer: Self-pay | Admitting: Family Medicine

## 2022-12-05 NOTE — Telephone Encounter (Signed)
Last office visit 09/11/2022 for Proteinuria and HTN.  Last refilled:  Tramadol not on currently medication list.  No future appointments.

## 2022-12-07 ENCOUNTER — Telehealth: Payer: Self-pay

## 2022-12-07 ENCOUNTER — Other Ambulatory Visit (HOSPITAL_COMMUNITY): Payer: Self-pay

## 2022-12-07 NOTE — Telephone Encounter (Signed)
Pharmacy Patient Advocate Encounter   Received notification from CoverMyMeds that prior authorization for traMADol HCl 50MG  tablets is required/requested.   Insurance verification completed.   The patient is insured through [employer] [facility] .   Per test claim: PA required; PA submitted to [employer] [facility] [place] via CoverMyMeds Key/confirmation #/EOC Key: [id number]  Status is pending

## 2022-12-10 NOTE — Telephone Encounter (Signed)
Pharmacy Patient Advocate Encounter  Received notification from [employer] [facility] that Prior Authorization for TRAMADOL 50MG  has been DENIED.  Full denial letter will be uploaded to the media tab. See denial reason below.   PA #/Case ID/Reference #: Key: [id number]

## 2022-12-10 NOTE — Telephone Encounter (Signed)
Noted  

## 2023-02-06 NOTE — Progress Notes (Unsigned)
Referring Physician:  No referring provider defined for this encounter.  Primary Physician:  Excell Seltzer, MD  History of Present Illness: [name] has a history of MS, HTN, CAD, asthma, GERD, UC.   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 He also has multilevel foraminal stenosis.   Also with known cervical spondylosis with mild central stenosis C2-C7 with multilevel foraminal stenosis.   He's had last lumbar ESI with [facility] on 09/18/22.   He was not interested in lumbar surgery or PT for cervical/lumbar spine at last visit.   He is here for follow up.   He had great relief with his last ESI in July. He has intermittent LBP with intermittent bilateral leg pain posterior to his knees that is worse with standing and walking. He has some relief when he sits/stops walking.   Pain has just started returning and is not as severe as it's been in the past.   He has MS and has chronic intermittent numbness and tingling in his feet. He had shingles in his left arm a few weeks ago- blisters are gone. Still having some nerve pain.   No bowel or bladder issues.   Conservative measures:  Physical therapy: has done in past, none recent. He works out regularly.   Multimodal medical therapy including regular antiinflammatories: tylenol  Injections:  L4-L5 IL ESI on 09/18/22 L4-L5 IL ESI on 05/09/22 L4-L5 IL ESI on 01/19/22 L4-L5 IL ESI on 09/14/21 L4-L5 IL ESI on 04/17/21 L4-L5 IL ESI on 12/15/20 L4-L5 IL ESI on 06/30/21 L4-L5 IL ESI on 01/25/20 L4-L5 IL ESI on 10/07/19 L4-L5 IL ESI on 07/21/19  Past Surgery: no spinal surgery  John Kelly has no symptoms of cervical myelopathy.  The symptoms are causing a significant impact on the patient's life.   Review of Systems:  A 10 point review of systems is negative, except for the pertinent positives and negatives detailed in the HPI.  Past Medical History: Past Medical History:  Diagnosis  Date   Abnormal liver function test    Allergic rhinitis    Allergy    Anxiety    CAD (coronary artery disease) 09/01/2016   a.  09/01/16; NSTEMI LHC: 60-70% ostial LAD stenosis, 20% mid LAD, 30% RCA. EF 55-65%  b. 09/07/16 [place] s/p DES to oLAD   DDD (degenerative disc disease)    Depression    GERD (gastroesophageal reflux disease)    Hyperlipidemia    Hypertension    no meds   Insomnia    MS (multiple sclerosis) (HCC)    Myocardial infarction (HCC)    2018   Neuromuscular disorder (HCC)    MS   Ulcerative colitis    left sided    Past Surgical History: Past Surgical History:  Procedure Laterality Date   COLONOSCOPY     CORONARY ANGIOGRAPHY N/A 09/07/2016   Procedure: Coronary Angiography;  Surgeon: Kathleene Hazel, MD;  Location: [facility] INVASIVE CV LAB;  Service: Cardiovascular;  Laterality: N/A;   CORONARY STENT INTERVENTION N/A 09/07/2016   Procedure: Coronary Stent Intervention;  Surgeon: Kathleene Hazel, MD;  Location: [facility] INVASIVE CV LAB;  Service: Cardiovascular;  Laterality: N/A;   Inguinal Herniorrhapy     Right and left   LEFT HEART CATH AND CORONARY ANGIOGRAPHY N/A 09/01/2016   Procedure: Left Heart Cath and Coronary Angiography;  Surgeon: Lennette Bihari, MD;  Location: [facility] INVASIVE CV LAB;  Service: Cardiovascular;  Laterality: N/A;  LEFT HEART CATH AND CORONARY ANGIOGRAPHY N/A 09/07/2016   Procedure: Left Heart Cath and Coronary Angiography;  Surgeon: Kathleene Hazel, MD;  Location: [facility] INVASIVE CV LAB;  Service: Cardiovascular;  Laterality: N/A;   PYLOROPLASTY      Allergies: Allergies as of 02/07/2023 - Review Complete 09/11/2022  Allergen Reaction Noted   Diclofenac sodium Shortness Of Breath and Swelling    Skelaxin [metaxalone] Nausea And Vomiting 01/17/2010   Trazodone and nefazodone Other (See Comments) 09/06/2016    Medications: Outpatient Encounter Medications as of 02/07/2023  Medication Sig   acetaminophen (TYLENOL) 500 MG tablet  Take 1,000 mg by mouth daily as needed for moderate pain or headache.   Ascorbic Acid (VITAMIN C) 1000 MG tablet Take 1,000 mg by mouth daily.   baclofen (LIORESAL) 10 MG tablet Take 10 mg by mouth 2 (two) times daily.   balsalazide (COLAZAL) 750 MG capsule Take 3 capsules (2,250 mg total) by mouth 3 (three) times daily.   Cholecalciferol (VITAMIN D3) 5000 units CAPS Take 5,000 Units by mouth daily.   DiphenhydrAMINE HCl, Sleep, (ZZZQUIL PO) Take 1 Dose by mouth at bedtime as needed (sleep).   esomeprazole (NEXIUM) 20 MG packet Take 20 mg by mouth daily.   losartan (COZAAR) 100 MG tablet Take 1 tablet (100 mg total) by mouth daily.   mesalamine (CANASA) 1000 MG suppository INSERT 1 SUPPOSITORY RECTALLY ONCE DAILY   metoprolol tartrate (LOPRESSOR) 25 MG tablet Take 1 tablet by mouth twice daily   Misc Natural Products (OSTEO BI-FLEX TRIPLE STRENGTH PO) Take 2 tablets by mouth daily.   nitroGLYCERIN (NITROSTAT) 0.4 MG SL tablet Place 1 tablet (0.4 mg total) under the tongue every 5 (five) minutes x 3 doses as needed for chest pain.   Nutritional Supplements (NUTRITIONAL SUPPLEMENT PO) Take by mouth. Test Max Boost- 1 tablet by mouth three times a day   vitamin E 45 MG (100 UNITS) capsule Take 100 Units by mouth daily.   zolpidem (AMBIEN) 5 MG tablet Take 1 tablet (5 mg total) by mouth at bedtime as needed for sleep.   No facility-administered encounter medications on file as of 02/07/2023.    Social History: Social History   Tobacco Use   Smoking status: Never   Smokeless tobacco: Never  Vaping Use   Vaping status: Never Used  Substance Use Topics   Alcohol use: Yes    Alcohol/week: 0.0 standard drinks of alcohol    Comment: rarely   Drug use: No    Family Medical History: Family History  Problem Relation Age of Onset   Coronary artery disease Mother    Heart attack Mother    Hyperlipidemia Mother    Hypertension Mother    Colitis Mother    Diabetes Father    Hyperlipidemia  Father    Hypertension Father    Coronary artery disease Father    Heart attack Father    Coronary artery disease Brother    Heart attack Brother 79       4 stents, smoker   Diabetes Brother    Hypertension Brother    Hypertension Sister    Transient ischemic attack Sister    Colon cancer Neg Hx    Esophageal cancer Neg Hx    Stomach cancer Neg Hx    Rectal cancer Neg Hx     Physical Examination: Vitals:   02/07/23 1502  BP: 128/78    Awake, alert, oriented to person, place, and time.  Speech is clear and fluent. Fund  of knowledge is appropriate.   Cranial Nerves: Pupils equal round and reactive to light.  Facial tone is symmetric.    He has no lower lumbar tenderness.   Strength:  Side Iliopsoas Quads Hamstring PF DF EHL  R 5 5 5 5 5 5   L 5 5 5 5 5 5    Reflexes are 2+ and symmetric at the patella and achilles.    Clonus is not present.   Bilateral lower extremity sensation is intact to light touch.     Gait is normal.    He has triggering of left ring finger with tenderness over A1 pulley.    Medical Decision Making  Imaging: None  Assessment and Plan: Mr. Sabharwal had great relief with his last ESI in July. He has intermittent LBP with intermittent bilateral leg pain posterior to his knees that is worse with standing and walking.   Pain has just started returning and is not as severe as it's been in the past.   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 He also has multilevel foraminal stenosis.   He has left ring trigger finger.   Treatment options discussed with patient and following plan made:   - He is not interested in any surgery options for his lumbar spine.  - He does not want to do any PT for his lumbar spine.  - He would like to proceed with repeat lumbar ESI with [facility] in [place].  - Refer to ortho at [facility] for left ring trigger finger.  - Will message him after the first of the year to check on him.    I spent a total of 15 minutes in face-to-face and non-face-to-face activities related to this patient's care toda including review of outside records, review of imaging, review of symptoms, physical exam, discussion of differential diagnosis, discussion of treatment options, and documentation.   Drake Leach PA-C Dept. of Neurosurgery

## 2023-02-07 ENCOUNTER — Ambulatory Visit: Payer: BC Managed Care – PPO | Admitting: Orthopedic Surgery

## 2023-02-07 ENCOUNTER — Encounter: Payer: Self-pay | Admitting: Orthopedic Surgery

## 2023-02-07 VITALS — BP 128/78 | Wt 176.0 lb

## 2023-02-07 DIAGNOSIS — M5416 Radiculopathy, lumbar region: Secondary | ICD-10-CM

## 2023-02-07 DIAGNOSIS — M4316 Spondylolisthesis, lumbar region: Secondary | ICD-10-CM | POA: Diagnosis not present

## 2023-02-07 DIAGNOSIS — M48061 Spinal stenosis, lumbar region without neurogenic claudication: Secondary | ICD-10-CM | POA: Diagnosis not present

## 2023-02-07 DIAGNOSIS — M65342 Trigger finger, left ring finger: Secondary | ICD-10-CM

## 2023-02-25 ENCOUNTER — Other Ambulatory Visit: Payer: BC Managed Care – PPO

## 2023-02-25 NOTE — Discharge Instructions (Signed)

## 2023-02-26 ENCOUNTER — Ambulatory Visit
Admission: RE | Admit: 2023-02-26 | Discharge: 2023-02-26 | Disposition: A | Discharge status: Home / Self Care | Payer: BC Managed Care – PPO | Source: Ambulatory Visit | Attending: Orthopedic Surgery | Admitting: Orthopedic Surgery

## 2023-02-26 DIAGNOSIS — M48061 Spinal stenosis, lumbar region without neurogenic claudication: Secondary | ICD-10-CM

## 2023-02-26 DIAGNOSIS — M5416 Radiculopathy, lumbar region: Secondary | ICD-10-CM

## 2023-02-26 DIAGNOSIS — M4316 Spondylolisthesis, lumbar region: Secondary | ICD-10-CM

## 2023-02-26 MED ORDER — IOPAMIDOL (ISOVUE-M 200) INJECTION 41%
1.0000 mL | Freq: Once | INTRAMUSCULAR | Status: AC
  Administered 2023-02-26: 1 mL via EPIDURAL

## 2023-02-26 MED ORDER — METHYLPREDNISOLONE ACETATE 40 MG/ML INJ SUSP (RADIOLOG
80.0000 mg | Freq: Once | INTRAMUSCULAR | Status: AC
  Administered 2023-02-26: 80 mg via EPIDURAL

## 2023-02-26 NOTE — Procedures (Signed)
Interventional Radiology Procedure Note  Procedure:   Image guided L4-L5 left lumbar epidural injection.   Complications: None  Recommendations:  - Ok to shower tomorrow - Do not submerge for 7 days - Routine care   Signed,  Yvone Neu. Loreta Ave, DO

## 2023-03-08 ENCOUNTER — Other Ambulatory Visit: Payer: Self-pay | Admitting: Cardiovascular Disease

## 2023-04-01 ENCOUNTER — Other Ambulatory Visit: Payer: Self-pay | Admitting: Cardiovascular Disease

## 2023-04-23 ENCOUNTER — Encounter: Payer: Self-pay | Admitting: Family Medicine

## 2023-04-23 ENCOUNTER — Telehealth: Payer: BC Managed Care – PPO | Admitting: Family Medicine

## 2023-04-23 VITALS — BP 130/60 | HR 94 | Temp 100.7°F | Ht 67.0 in | Wt 174.1 lb

## 2023-04-23 DIAGNOSIS — R509 Fever, unspecified: Secondary | ICD-10-CM | POA: Insufficient documentation

## 2023-04-23 LAB — POC INFLUENZA A&B (BINAX/QUICKVUE)
Influenza A, POC: NEGATIVE
Influenza B, POC: NEGATIVE

## 2023-04-23 LAB — POC COVID19 BINAXNOW: SARS Coronavirus 2 Ag: NEGATIVE

## 2023-04-23 MED ORDER — DOXYCYCLINE HYCLATE 100 MG PO TABS: 100.0000 mg | ORAL_TABLET | Freq: Two times a day (BID) | ORAL | 0 refills | Status: DC

## 2023-04-23 MED ORDER — GUAIFENESIN-CODEINE 100-10 MG/5ML PO SOLN: 5.0000 mL | Freq: Four times a day (QID) | ORAL | 0 refills | Status: DC | PRN

## 2023-04-23 NOTE — Assessment & Plan Note (Signed)
 Acute, possible unresolved illness ongoing for 3 to 4 weeks now with new fever versus new infection.  Exposure to flu but COVID and flu test negative in office today.  Encouraged patient to repeat COVID flu test in 2 to 3 days for consideration of antivirals. Will currently treat for bacterial superinfection given fever possibly late in illness... Treat with doxycycline  100 mg twice daily x 10 days.  Recommended rest, fluids and can use prescription cough suppressant.  Return and ER precautions provided.

## 2023-04-23 NOTE — Progress Notes (Signed)
 VIRTUAL VISIT A virtual visit is felt to be most appropriate for this patient at this time.   I connected with the patient on 04/23/23 at  3:40 PM EST by virtual telehealth platform and verified that I am speaking with the correct person using two identifiers.   I discussed the limitations, risks, security and privacy concerns of performing an evaluation and management service by  virtual telehealth platform and the availability of in person appointments. I also discussed with the patient that there may be a patient responsible charge related to this service. The patient expressed understanding and agreed to proceed.  Patient location:  [facility] Provider Location: Home Participants: Greig Ring and Tanda Shouts   Chief Complaint  Patient presents with   Fever   Cough    Had cold 4 weeks ago but never seemed to go away   Nasal Congestion   Chills       Blood pressure 130/60, pulse 94, temperature (!) 100.7 F (38.2 C), temperature source Oral, height 5' 7 (1.702 m), weight 174 lb 2 oz (79 kg), SpO2 97%.   History of Present Illness:  58 y.o. male patient of Travers Goodley E, MD presents with  cough   Date of onset:  Had cold 1 months ago,felt better but after 3 weeks of issues and now new fever in lat 24 hours Initial symptoms included  fever, chills, nasal congestion. ST Symptoms progressed to body aches, productive cough, greenish   No ear pain, no face pain  SOB with coughing fits.     Sick contacts:  people at work with the flu COVID testing:   none     He has tried to treat with  theraflu    No history of chronic lung disease such as asthma or COPD. Non-smoker.   Not currently immunocompromised.  COVID 19 screen No recent travel or known exposure to COVID19 The patient denies respiratory symptoms of COVID 19 at this time.  The importance of social distancing was discussed today.   Review of Systems  Constitutional:  Positive for fever and  malaise/fatigue. Negative for chills.  HENT:  Positive for congestion. Negative for ear pain.   Eyes:  Negative for pain and redness.  Respiratory:  Positive for cough, sputum production and shortness of breath. Negative for hemoptysis and wheezing.   Cardiovascular:  Negative for chest pain, palpitations and leg swelling.  Gastrointestinal:  Negative for abdominal pain, blood in stool, constipation, diarrhea, nausea and vomiting.  Genitourinary:  Negative for dysuria.  Musculoskeletal:  Positive for myalgias. Negative for falls.  Skin:  Negative for rash.  Neurological:  Negative for dizziness.  Psychiatric/Behavioral:  Negative for depression. The patient is not nervous/anxious.       Past Medical History:  Diagnosis Date   Abnormal liver function test    Allergic rhinitis    Allergy    Anxiety    CAD (coronary artery disease) 09/01/2016   a.  09/01/16; NSTEMI LHC: 60-70% ostial LAD stenosis, 20% mid LAD, 30% RCA. EF 55-65%  b. 09/07/16 [place]  s/p DES to oLAD   DDD (degenerative disc disease)    Depression    GERD (gastroesophageal reflux disease)    Hyperlipidemia    Hypertension    no meds   Insomnia    MS (multiple sclerosis) (HCC)    Myocardial infarction (HCC)    2018   Neuromuscular disorder (HCC)    MS   Ulcerative colitis    left sided  reports that he has never smoked. He has never used smokeless tobacco. He reports current alcohol use. He reports that he does not use drugs.   Current Outpatient Medications:    acetaminophen  (TYLENOL ) 500 MG tablet, Take 1,000 mg by mouth daily as needed for moderate pain or headache., Disp: , Rfl:    Ascorbic Acid (VITAMIN C) 1000 MG tablet, Take 1,000 mg by mouth daily., Disp: , Rfl:    baclofen  (LIORESAL ) 10 MG tablet, Take 10 mg by mouth 2 (two) times daily., Disp: , Rfl:    balsalazide (COLAZAL ) 750 MG capsule, Take 3 capsules (2,250 mg total) by mouth 3 (three) times daily., Disp: 270 capsule, Rfl: 11   Cholecalciferol   (VITAMIN D3) 5000 units CAPS, Take 5,000 Units by mouth daily., Disp: , Rfl:    DiphenhydrAMINE HCl, Sleep, (ZZZQUIL PO), Take 1 Dose by mouth at bedtime as needed (sleep)., Disp: , Rfl:    doxycycline  (VIBRA -TABS) 100 MG tablet, Take 1 tablet (100 mg total) by mouth 2 (two) times daily., Disp: 20 tablet, Rfl: 0   esomeprazole (NEXIUM) 20 MG packet, Take 20 mg by mouth daily., Disp: , Rfl:    guaiFENesin -codeine  100-10 MG/5ML syrup, Take 5 mLs by mouth every 6 (six) hours as needed for cough., Disp: 100 mL, Rfl: 0   losartan  (COZAAR ) 100 MG tablet, Take 1 tablet (100 mg total) by mouth daily., Disp: 90 tablet, Rfl: 3   mesalamine  (CANASA ) 1000 MG suppository, INSERT 1 SUPPOSITORY RECTALLY ONCE DAILY, Disp: 30 suppository, Rfl: 0   metoprolol  tartrate (LOPRESSOR ) 25 MG tablet, Take 1 tablet by mouth twice daily, Disp: 30 tablet, Rfl: 1   Misc Natural Products (OSTEO BI-FLEX TRIPLE STRENGTH PO), Take 2 tablets by mouth daily., Disp: , Rfl:    nitroGLYCERIN  (NITROSTAT ) 0.4 MG SL tablet, Place 1 tablet (0.4 mg total) under the tongue every 5 (five) minutes x 3 doses as needed for chest pain., Disp: 25 tablet, Rfl: 6   Nutritional Supplements (NUTRITIONAL SUPPLEMENT PO), Take by mouth. Test Max Boost- 1 tablet by mouth three times a day, Disp: , Rfl:    vitamin E 45 MG (100 UNITS) capsule, Take 100 Units by mouth daily., Disp: , Rfl:    zolpidem  (AMBIEN ) 5 MG tablet, Take 1 tablet (5 mg total) by mouth at bedtime as needed for sleep., Disp: 15 tablet, Rfl: 0   Observations/Objective: Blood pressure 130/60, pulse 94, temperature (!) 100.7 F (38.2 C), temperature source Oral, height 5' 7 (1.702 m), weight 174 lb 2 oz (79 kg), SpO2 97%.  Physical Exam Constitutional:      General: The patient is not in acute distress. Pulmonary:     Effort: Pulmonary effort is normal. No respiratory distress.  Neurological:     Mental Status: The patient is alert and oriented to person, place, and time.   Psychiatric:        Mood and Affect: Mood normal.        Behavior: Behavior normal.    Assessment and Plan Fever and chills Assessment & Plan: Acute, possible unresolved illness ongoing for 3 to 4 weeks now with new fever versus new infection.  Exposure to flu but COVID and flu test negative in office today.  Encouraged patient to repeat COVID flu test in 2 to 3 days for consideration of antivirals. Will currently treat for bacterial superinfection given fever possibly late in illness... Treat with doxycycline  100 mg twice daily x 10 days.  Recommended rest, fluids and can use prescription  cough suppressant.  Return and ER precautions provided.  Orders: -     POC Influenza A&B(BINAX/QUICKVUE) -     POC COVID-19 BinaxNow  Other orders -     Doxycycline  Hyclate; Take 1 tablet (100 mg total) by mouth 2 (two) times daily.  Dispense: 20 tablet; Refill: 0 -     guaiFENesin -Codeine ; Take 5 mLs by mouth every 6 (six) hours as needed for cough.  Dispense: 100 mL; Refill: 0      I discussed the assessment and treatment plan with the patient. The patient was provided an opportunity to ask questions and all were answered. The patient agreed with the plan and demonstrated an understanding of the instructions.   The patient was advised to call back or seek an in-person evaluation if the symptoms worsen or if the condition fails to improve as anticipated.     Greig Ring, MD

## 2023-08-27 DIAGNOSIS — M48061 Spinal stenosis, lumbar region without neurogenic claudication: Secondary | ICD-10-CM

## 2023-08-27 DIAGNOSIS — M5416 Radiculopathy, lumbar region: Secondary | ICD-10-CM

## 2023-08-27 DIAGNOSIS — M4316 Spondylolisthesis, lumbar region: Secondary | ICD-10-CM

## 2023-08-27 NOTE — Telephone Encounter (Signed)
 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 He also has multilevel foraminal stenosis.   Had left L4-L5 IL with IR on 02/26/23 with good relief.    Repeat left L4-L5 IL ESI ordered.

## 2023-09-16 ENCOUNTER — Encounter: Payer: Self-pay | Admitting: Orthopedic Surgery

## 2023-09-19 ENCOUNTER — Ambulatory Visit
Admission: RE | Admit: 2023-09-19 | Discharge: 2023-09-19 | Disposition: A | Discharge status: Home / Self Care | Source: Ambulatory Visit | Attending: Orthopedic Surgery | Admitting: Orthopedic Surgery

## 2023-09-19 DIAGNOSIS — M48061 Spinal stenosis, lumbar region without neurogenic claudication: Secondary | ICD-10-CM

## 2023-09-19 DIAGNOSIS — M5416 Radiculopathy, lumbar region: Secondary | ICD-10-CM

## 2023-09-19 DIAGNOSIS — M4316 Spondylolisthesis, lumbar region: Secondary | ICD-10-CM

## 2023-09-19 MED ORDER — IOPAMIDOL (ISOVUE-M 200) INJECTION 41%
1.0000 mL | Freq: Once | INTRAMUSCULAR | Status: AC
  Administered 2023-09-19: 1 mL via EPIDURAL

## 2023-09-19 MED ORDER — METHYLPREDNISOLONE ACETATE 40 MG/ML INJ SUSP (RADIOLOG
80.0000 mg | Freq: Once | INTRAMUSCULAR | Status: AC
  Administered 2023-09-19: 80 mg via EPIDURAL

## 2023-09-19 NOTE — Discharge Instructions (Signed)

## 2023-11-09 ENCOUNTER — Other Ambulatory Visit: Payer: Self-pay | Admitting: Family Medicine

## 2023-11-11 NOTE — Telephone Encounter (Signed)
Please schedule CPE with fasting labs prior with Dr. Ermalene Searing.  Please send back to me once scheduled to refill medication.

## 2023-11-11 NOTE — Telephone Encounter (Signed)
 Spoke to patient to schedule he stated he was not near a calendar and would have to call back to schedule.

## 2023-11-13 ENCOUNTER — Telehealth: Payer: Self-pay | Admitting: *Deleted

## 2023-11-13 DIAGNOSIS — Z125 Encounter for screening for malignant neoplasm of prostate: Secondary | ICD-10-CM

## 2023-11-13 DIAGNOSIS — E7800 Pure hypercholesterolemia, unspecified: Secondary | ICD-10-CM

## 2023-11-13 NOTE — Telephone Encounter (Signed)
-----   Message from Veva JINNY Ferrari sent at 11/13/2023  8:23 AM EDT ----- Regarding: Lab orders for [facility] 11/14/23 Patient is scheduled for CPX labs, please order future labs, Thanks , Veva

## 2023-11-14 ENCOUNTER — Ambulatory Visit: Payer: Self-pay | Admitting: Family Medicine

## 2023-11-14 ENCOUNTER — Other Ambulatory Visit (INDEPENDENT_AMBULATORY_CARE_PROVIDER_SITE_OTHER)

## 2023-11-14 DIAGNOSIS — E7800 Pure hypercholesterolemia, unspecified: Secondary | ICD-10-CM

## 2023-11-14 DIAGNOSIS — Z125 Encounter for screening for malignant neoplasm of prostate: Secondary | ICD-10-CM

## 2023-11-14 LAB — LIPID PANEL
Cholesterol: 148 mg/dL (ref 0–200)
HDL: 33 mg/dL — ABNORMAL LOW (ref >39.00)
LDL Cholesterol: 90 mg/dL (ref 0–99)
NonHDL: 115.4
Total CHOL/HDL Ratio: 4
Triglycerides: 129 mg/dL (ref 0.0–149.0)
VLDL: 25.8 mg/dL (ref 0.0–40.0)

## 2023-11-14 LAB — COMPREHENSIVE METABOLIC PANEL WITH GFR
ALT: 17 U/L (ref 0–53)
AST: 19 U/L (ref 0–37)
Albumin: 3.9 g/dL (ref 3.5–5.2)
Alkaline Phosphatase: 60 U/L (ref 39–117)
BUN: 12 mg/dL (ref 6–23)
CO2: 27 meq/L (ref 19–32)
Calcium: 8.7 mg/dL (ref 8.4–10.5)
Chloride: 105 meq/L (ref 96–112)
Creatinine, Ser: 1.19 mg/dL (ref 0.40–1.50)
GFR: 67.09 mL/min (ref >60.00)
Glucose, Bld: 96 mg/dL (ref 70–99)
Potassium: 4.2 meq/L (ref 3.5–5.1)
Sodium: 141 meq/L (ref 135–145)
Total Bilirubin: 0.7 mg/dL (ref 0.2–1.2)
Total Protein: 6.5 g/dL (ref 6.0–8.3)

## 2023-11-14 LAB — PSA: PSA: 1.3 ng/mL (ref 0.10–4.00)

## 2023-11-14 NOTE — Progress Notes (Signed)
 No critical labs need to be addressed urgently. We will discuss labs in detail at upcoming office visit.

## 2023-11-28 ENCOUNTER — Encounter: Payer: Self-pay | Admitting: Family Medicine

## 2023-11-28 ENCOUNTER — Ambulatory Visit (INDEPENDENT_AMBULATORY_CARE_PROVIDER_SITE_OTHER): Admitting: Family Medicine

## 2023-11-28 VITALS — BP 138/72 | HR 79 | Temp 98.6°F | Resp 20 | Ht 66.5 in | Wt 179.4 lb

## 2023-11-28 DIAGNOSIS — Z0000 Encounter for general adult medical examination without abnormal findings: Secondary | ICD-10-CM | POA: Diagnosis not present

## 2023-11-28 DIAGNOSIS — F325 Major depressive disorder, single episode, in full remission: Secondary | ICD-10-CM

## 2023-11-28 DIAGNOSIS — Z113 Encounter for screening for infections with a predominantly sexual mode of transmission: Secondary | ICD-10-CM | POA: Insufficient documentation

## 2023-11-28 DIAGNOSIS — K51319 Ulcerative (chronic) rectosigmoiditis with unspecified complications: Secondary | ICD-10-CM | POA: Diagnosis not present

## 2023-11-28 DIAGNOSIS — E7800 Pure hypercholesterolemia, unspecified: Secondary | ICD-10-CM

## 2023-11-28 DIAGNOSIS — I2510 Atherosclerotic heart disease of native coronary artery without angina pectoris: Secondary | ICD-10-CM

## 2023-11-28 DIAGNOSIS — I10 Essential (primary) hypertension: Secondary | ICD-10-CM

## 2023-11-28 DIAGNOSIS — G35 Multiple sclerosis: Secondary | ICD-10-CM

## 2023-11-28 MED ORDER — BACLOFEN 10 MG PO TABS: 10.0000 mg | ORAL_TABLET | Freq: Two times a day (BID) | ORAL | 0 refills | Status: AC | PRN

## 2023-11-28 MED ORDER — ATORVASTATIN CALCIUM 40 MG PO TABS: 40.0000 mg | ORAL_TABLET | Freq: Every day | ORAL | 3 refills | Status: AC

## 2023-11-28 NOTE — Assessment & Plan Note (Signed)
Chronic, stable control followed by neurology

## 2023-11-28 NOTE — Progress Notes (Signed)
 Patient ID: John Kelly, male    DOB: Feb 18, 1966, 58 y.o.   MRN: [id number]  This visit was conducted in person.  BP 138/72   Pulse 79   Temp 98.6 F (37 C)   Resp 20   Ht 5' 6.5 (1.689 m)   Wt 179 lb 6 oz (81.4 kg)   SpO2 95%   BMI 28.52 kg/m    CC:  Chief Complaint  Patient presents with   Annual Exam    Subjective:   HPI: John Kelly is a 58 y.o. male presenting on 11/28/2023 for Annual Exam   Currently treating  left bacterial conjunctivitis per [facility] with  antibiotic eye drops.   No fever.  Hypertension:    Good control on losartan  100 mg daily BP Readings from Last 3 Encounters:  11/28/23 138/72  09/19/23 (!) 150/69  04/23/23 130/60  Using medication without problems or lightheadedness:   occ possible more from the MS. Chest pain with exertion:none Edema:none Short of breath: none Average home BPs: Other issues:  Wt Readings from Last 3 Encounters:  11/28/23 179 lb 6 oz (81.4 kg)  04/23/23 174 lb 2 oz (79 kg)  02/07/23 176 lb (79.8 kg)     Elevated Cholesterol: LDL  no longer at goal off atorvastatin  40 mg daily... not sure why stopped.  Lab Results  Component Value Date   CHOL 148 11/14/2023   HDL 33.00 (L) 11/14/2023   LDLCALC 90 11/14/2023   LDLDIRECT 103.0 06/01/2019   TRIG 129.0 11/14/2023   CHOLHDL 4 11/14/2023  Using medications without problems: Muscle aches:  Diet compliance: moderate Exercise: reguar Other complaints: CAD followed by Dr, Burnard   Chronic insomnia, MDD: Excellent control, in remission on no medication. No longer using Ambien  at bedtime. Flowsheet Row Office Visit from 09/11/2022 in [facility]  PHQ-2 Total Score 2    Multiple Sclerosis: followed by neuro  Dr. Maree Mild disease course. On no chronic meds.   Ulcerative colitis followed by GI. Previously on Canasa  and balsalazide.  Relevant past medical, surgical, family and social history reviewed and updated as  indicated. Interim medical history since our last visit reviewed. Allergies and medications reviewed and updated. Outpatient Medications Prior to Visit  Medication Sig Dispense Refill   acetaminophen  (TYLENOL ) 500 MG tablet Take 1,000 mg by mouth daily as needed for moderate pain or headache.     Ascorbic Acid (VITAMIN C) 1000 MG tablet Take 1,000 mg by mouth daily.     balsalazide (COLAZAL ) 750 MG capsule Take 3 capsules (2,250 mg total) by mouth 3 (three) times daily. 270 capsule 11   Cholecalciferol  (VITAMIN D3) 5000 units CAPS Take 5,000 Units by mouth daily.     esomeprazole (NEXIUM) 20 MG packet Take 20 mg by mouth daily.     guaiFENesin -codeine  100-10 MG/5ML syrup Take 5 mLs by mouth every 6 (six) hours as needed for cough. 100 mL 0   losartan  (COZAAR ) 100 MG tablet Take 1 tablet by mouth once daily 30 tablet 0   Misc Natural Products (OSTEO BI-FLEX TRIPLE STRENGTH PO) Take 2 tablets by mouth daily.     nitroGLYCERIN  (NITROSTAT ) 0.4 MG SL tablet Place 1 tablet (0.4 mg total) under the tongue every 5 (five) minutes x 3 doses as needed for chest pain. 25 tablet 6   Nutritional Supplements (NUTRITIONAL SUPPLEMENT PO) Take by mouth. Test Max Boost- 1 tablet by mouth three times a day  trimethoprim-polymyxin b (POLYTRIM) ophthalmic solution Place 1 drop into both eyes every 4 (four) hours.     baclofen  (LIORESAL ) 10 MG tablet Take 10 mg by mouth 2 (two) times daily.     doxycycline  (VIBRA -TABS) 100 MG tablet Take 1 tablet (100 mg total) by mouth 2 (two) times daily. 20 tablet 0   mesalamine  (CANASA ) 1000 MG suppository INSERT 1 SUPPOSITORY RECTALLY ONCE DAILY 30 suppository 0   DiphenhydrAMINE HCl, Sleep, (ZZZQUIL PO) Take 1 Dose by mouth at bedtime as needed (sleep). (Patient not taking: Reported on 11/28/2023)     metoprolol  tartrate (LOPRESSOR ) 25 MG tablet Take 1 tablet by mouth twice daily (Patient not taking: Reported on 11/28/2023) 30 tablet 1   vitamin E 45 MG (100 UNITS) capsule Take  100 Units by mouth daily. (Patient not taking: Reported on 11/28/2023)     zolpidem  (AMBIEN ) 5 MG tablet Take 1 tablet (5 mg total) by mouth at bedtime as needed for sleep. (Patient not taking: Reported on 11/28/2023) 15 tablet 0   No facility-administered medications prior to visit.     Per HPI unless specifically indicated in ROS section below Review of Systems  Constitutional:  Negative for fatigue and fever.  HENT:  Negative for ear pain.   Eyes:  Negative for pain.  Respiratory:  Negative for cough and shortness of breath.   Cardiovascular:  Negative for chest pain, palpitations and leg swelling.  Gastrointestinal:  Negative for abdominal pain.  Genitourinary:  Negative for dysuria.  Musculoskeletal:  Negative for arthralgias.  Neurological:  Negative for syncope, light-headedness and headaches.  Psychiatric/Behavioral:  Negative for dysphoric mood.    Objective:  BP 138/72   Pulse 79   Temp 98.6 F (37 C)   Resp 20   Ht 5' 6.5 (1.689 m)   Wt 179 lb 6 oz (81.4 kg)   SpO2 95%   BMI 28.52 kg/m   Wt Readings from Last 3 Encounters:  11/28/23 179 lb 6 oz (81.4 kg)  04/23/23 174 lb 2 oz (79 kg)  02/07/23 176 lb (79.8 kg)      Physical Exam Vitals reviewed.  Constitutional:      Appearance: He is well-developed.  HENT:     Head: Normocephalic.     Right Ear: Hearing normal.     Left Ear: Hearing normal.     Nose: Nose normal.  Eyes:     General: Lids are normal. Vision grossly intact.     Extraocular Movements:     Right eye: Normal extraocular motion.     Left eye: Normal extraocular motion.     Conjunctiva/sclera:     Right eye: Right conjunctiva is injected. No exudate or hemorrhage.    Left eye: Left conjunctiva is injected. Exudate present. No hemorrhage. Neck:     Thyroid : No thyroid  mass or thyromegaly.     Vascular: No carotid bruit.     Trachea: Trachea normal.  Cardiovascular:     Rate and Rhythm: Normal rate and regular rhythm.     Pulses: Normal  pulses.     Heart sounds: Heart sounds not distant. No murmur heard.    No friction rub. No gallop.     Comments: No peripheral edema Pulmonary:     Effort: Pulmonary effort is normal. No respiratory distress.     Breath sounds: Normal breath sounds.  Skin:    General: Skin is warm and dry.     Findings: No rash.  Psychiatric:  Speech: Speech normal.        Behavior: Behavior normal.        Thought Content: Thought content normal.       Results for orders placed or performed in visit on 11/14/23  PSA   Collection Time: 11/14/23  7:23 AM  Result Value Ref Range   PSA 1.30 0.10 - 4.00 ng/mL  Comprehensive metabolic panel   Collection Time: 11/14/23  7:23 AM  Result Value Ref Range   Sodium 141 135 - 145 mEq/L   Potassium 4.2 3.5 - 5.1 mEq/L   Chloride 105 96 - 112 mEq/L   CO2 27 19 - 32 mEq/L   Glucose, Bld 96 70 - 99 mg/dL   BUN 12 6 - 23 mg/dL   Creatinine, Ser 8.80 0.40 - 1.50 mg/dL   Total Bilirubin 0.7 0.2 - 1.2 mg/dL   Alkaline Phosphatase 60 39 - 117 U/L   AST 19 0 - 37 U/L   ALT 17 0 - 53 U/L   Total Protein 6.5 6.0 - 8.3 g/dL   Albumin 3.9 3.5 - 5.2 g/dL   GFR 32.90 >39.99 mL/min   Calcium  8.7 8.4 - 10.5 mg/dL  Lipid panel   Collection Time: 11/14/23  7:23 AM  Result Value Ref Range   Cholesterol 148 0 - 200 mg/dL   Triglycerides 870.9 0.0 - 149.0 mg/dL   HDL 66.99 (L) >60.99 mg/dL   VLDL 74.1 0.0 - 59.9 mg/dL   LDL Cholesterol 90 0 - 99 mg/dL   Total CHOL/HDL Ratio 4    NonHDL 115.40      COVID 19 screen:  No recent travel or known exposure to COVID19 The patient denies respiratory symptoms of COVID 19 at this time. The importance of social distancing was discussed today.   Assessment and Plan   The patient's preventative maintenance and recommended screening tests for an annual wellness exam were reviewed in full today. Brought up to date unless services declined.  Counselled on the importance of diet, exercise, and its role in overall  health and mortality. The patient's FH and SH was reviewed, including their home life, tobacco status, and drug and alcohol status.   Colonoscopy per GI q 3-5 years, last 06/2019 DUE Vaccines:  uptodate with td, refused flu. COVID19 vaccine  x 1 had significant side effects,  consider shingrix, PNA, hep B  STD screen  not indicated.  ETOH/drugs: rare/none  Nonsmoker  No family history of prostate cancer in first degree. Plan recheck in 1 year Lab Results  Component Value Date   PSA 1.30 11/14/2023   PSA 0.72 10/06/2021   PSA 0.99 07/08/2020   Problem List Items Addressed This Visit     CAD (coronary artery disease)    Needs stain.      Relevant Medications   atorvastatin  (LIPITOR ) 40 MG tablet   Depression, major, single episode, complete remission (HCC)   Chronic, in remission on no medication      Essential hypertension, benign   Stable, chronic.  Continue current medication.  Losartan  100 mg p.o. daily      Relevant Medications   atorvastatin  (LIPITOR ) 40 MG tablet   High cholesterol   Chronic, no longer at goal off of atorvastatin .  He will restart atorvastatin  40 mg daily.      Relevant Medications   atorvastatin  (LIPITOR ) 40 MG tablet   Multiple sclerosis (HCC)   Chronic, stable control followed by neurology      Screening examination for STI  Relevant Orders   HIV Antibody (routine testing w rflx)   RPR   C. trachomatis/N. gonorrhoeae RNA   Hepatitis panel, acute   HSV 1/2 Ab (IgM), IFA w/rflx Titer   Ulcerative proctosigmoiditis with complication (HCC)   Chronic, stable followed by GI      Other Visit Diagnoses       Routine general medical examination at a health care facility    -  Primary           Greig Ring, MD

## 2023-11-28 NOTE — Assessment & Plan Note (Signed)
 Needs stain.

## 2023-11-28 NOTE — Assessment & Plan Note (Signed)
Chronic, no longer at goal off of atorvastatin.  He will restart atorvastatin 40 mg daily.

## 2023-11-28 NOTE — Assessment & Plan Note (Signed)
Stable, chronic.  Continue current medication.  Losartan 100 mg p.o. daily

## 2023-11-28 NOTE — Assessment & Plan Note (Signed)
Chronic, stable followed by GI

## 2023-11-28 NOTE — Patient Instructions (Signed)
 Please stop at the lab to have labs drawn.

## 2023-11-28 NOTE — Assessment & Plan Note (Signed)
Chronic, in remission on no medication 

## 2023-12-03 ENCOUNTER — Ambulatory Visit: Payer: Self-pay | Admitting: Family Medicine

## 2023-12-03 DIAGNOSIS — Z21 Asymptomatic human immunodeficiency virus [HIV] infection status: Secondary | ICD-10-CM

## 2023-12-03 MED ORDER — DOXYCYCLINE HYCLATE 100 MG PO TABS: 100.0000 mg | ORAL_TABLET | Freq: Two times a day (BID) | ORAL | 0 refills | Status: DC

## 2023-12-06 ENCOUNTER — Telehealth: Payer: Self-pay | Admitting: *Deleted

## 2023-12-06 LAB — HEPATITIS PANEL, ACUTE
Hep A IgM: NONREACTIVE
Hep B C IgM: NONREACTIVE
Hepatitis B Surface Ag: NONREACTIVE
Hepatitis C Ab: NONREACTIVE

## 2023-12-06 LAB — HIV-1/2 AB - DIFFERENTIATION
HIV-1 antibody: POSITIVE — AB
HIV-2 Ab: NEGATIVE

## 2023-12-06 LAB — TEST AUTHORIZATION

## 2023-12-06 LAB — HERPES SIMPLEX VIRUS 1 AND 2 (IGG),REFLEX HSV-2 INHIBITION
HSV 1 IGG,TYPE SPECIFIC AB: 14.8 {index} — ABNORMAL HIGH
HSV 2 IGG,TYPE SPECIFIC AB: 7.09 {index} — ABNORMAL HIGH

## 2023-12-06 LAB — T PALLIDUM AB: T Pallidum Abs: NEGATIVE

## 2023-12-06 LAB — HSV 2 INHIBITION: HSV 2 IGG INHIBITION,IA: POSITIVE — AB

## 2023-12-06 LAB — TIQ- AMBIGUOUS ORDER

## 2023-12-06 LAB — HIV ANTIBODY (ROUTINE TESTING W REFLEX)
HIV 1&2 Ab, 4th Generation: REACTIVE
HIV FINAL INTERPRETATION: POSITIVE — AB

## 2023-12-06 LAB — C. TRACHOMATIS/N. GONORRHOEAE RNA
C. trachomatis RNA, TMA: NOT DETECTED
N. gonorrhoeae RNA, TMA: NOT DETECTED

## 2023-12-06 LAB — RPR: RPR Ser Ql: REACTIVE — AB

## 2023-12-06 LAB — RPR TITER: RPR Titer: 1:1 {titer} — ABNORMAL HIGH

## 2023-12-06 NOTE — Telephone Encounter (Signed)
 Copied from CRM [id number]. Topic: General - Other >> Dec 06, 2023  1:18 PM Mercedes MATSU wrote: Reason for CRM: Dept of Health called in wanting to verify the patients demographics, and his HIV status. [place] state dept requesting a call back at [phone].

## 2023-12-06 NOTE — Telephone Encounter (Signed)
 Spoke with [place] at the Department of Health, via telephone, and provider information needed.

## 2023-12-06 NOTE — Telephone Encounter (Signed)
 Copied from CRM [id number]. Topic: General - Other >> Dec 06, 2023 11:01 AM Sherelle Castelli BRAVO wrote: Reason for CRM:  Meg [phone] [facility]  Has questions regarding testing and symptoms of patient

## 2023-12-11 ENCOUNTER — Telehealth: Payer: Self-pay

## 2023-12-11 ENCOUNTER — Other Ambulatory Visit (HOSPITAL_COMMUNITY): Payer: Self-pay

## 2023-12-11 ENCOUNTER — Other Ambulatory Visit: Payer: Self-pay | Admitting: Family Medicine

## 2023-12-11 NOTE — Telephone Encounter (Signed)
 Pharmacy Patient Advocate Encounter  Insurance verification completed.   The patient is insured through [facility]   Ran test claim for [employer]. Currently a quantity of 30 is a 30 day supply and the co-pay is $100.00 . [place] will need a PA  This test claim was processed through [facility]- copay amounts may vary at other pharmacies due to pharmacy/plan contracts, or as the patient moves through the different stages of their insurance plan.

## 2023-12-15 NOTE — Progress Notes (Unsigned)
 12/15/2023  HPI: John Kelly is a 58 y.o. male who presents to the [facility] clinic today to initiate care for a newly diagnosed HIV infection.  Patient Active Problem List   Diagnosis Date Noted   Screening examination for STI 11/28/2023   Cervical spinal stenosis 06/04/2019   High cholesterol 09/06/2016   CAD (coronary artery disease) 09/01/2016   Umbilical hernia 04/13/2016   Multiple sclerosis 12/15/2014   DDD (degenerative disc disease), lumbar 09/22/2013   Chronic insomnia 09/22/2013   Family history of early CAD 09/22/2013   Depression, major, single episode, 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   Mild intermittent asthma 01/07/2009   Essential hypertension, benign 04/14/2007   Allergic rhinitis 04/14/2007   GERD 04/14/2007    Patient's Medications  New Prescriptions   No medications on file  Previous Medications   ACETAMINOPHEN  (TYLENOL ) 500 MG TABLET    Take 1,000 mg by mouth daily as needed for moderate pain or headache.   ASCORBIC ACID (VITAMIN C) 1000 MG TABLET    Take 1,000 mg by mouth daily.   ATORVASTATIN  (LIPITOR ) 40 MG TABLET    Take 1 tablet (40 mg total) by mouth daily.   BACLOFEN  (LIORESAL ) 10 MG TABLET    Take 1 tablet (10 mg total) by mouth 2 (two) times daily as needed for muscle spasms.   BALSALAZIDE (COLAZAL ) 750 MG CAPSULE    Take 3 capsules (2,250 mg total) by mouth 3 (three) times daily.   CHOLECALCIFEROL  (VITAMIN D3) 5000 UNITS CAPS    Take 5,000 Units by mouth daily.   DOXYCYCLINE  (VIBRA -TABS) 100 MG TABLET    Take 1 tablet (100 mg total) by mouth 2 (two) times daily.   ESOMEPRAZOLE (NEXIUM) 20 MG PACKET    Take 20 mg by mouth daily.   GUAIFENESIN -CODEINE  100-10 MG/5ML SYRUP    Take 5 mLs by mouth every 6 (six) hours as needed for cough.   LOSARTAN  (COZAAR ) 100 MG TABLET    Take 1 tablet by mouth once daily   MISC NATURAL PRODUCTS (OSTEO BI-FLEX TRIPLE STRENGTH PO)    Take 2 tablets by mouth  daily.   NITROGLYCERIN  (NITROSTAT ) 0.4 MG SL TABLET    Place 1 tablet (0.4 mg total) under the tongue every 5 (five) minutes x 3 doses as needed for chest pain.   NUTRITIONAL SUPPLEMENTS (NUTRITIONAL SUPPLEMENT PO)    Take by mouth. Test Max Boost- 1 tablet by mouth three times a day  Modified Medications   No medications on file  Discontinued Medications   No medications on file    Labs: No results found for: HIV1RNAQUANT, HIV1RNAVL, CD4TABS  RPR and STI Lab Results  Component Value Date   LABRPR REACTIVE (A) 11/28/2023   LABRPR NON-REACTIVE 11/16/2020   LABRPR NON-REACTIVE 03/17/2020   LABRPR NON-REACTIVE 06/01/2019   LABRPR NON-REACTIVE 04/24/2018   RPRTITER 1:1 (H) 11/28/2023    STI Results GC CT  09/01/2022 11:49 AM Negative  Negative   05/01/2018 12:00 AM Negative  Negative     Hepatitis B Lab Results  Component Value Date   HEPBSAB REACTIVE (A) 03/17/2020   HEPBSAG NON-REACTIVE 11/28/2023   Hepatitis C Lab Results  Component Value Date   HEPCAB NON-REACTIVE 11/28/2023   Hepatitis A No results found for: HAV Lipids: Lab Results  Component Value Date   CHOL 148 11/14/2023   TRIG 129.0 11/14/2023   HDL 33.00 (L) 11/14/2023   CHOLHDL 4 11/14/2023   VLDL 25.8 11/14/2023  LDLCALC 90 11/14/2023    Current HIV Regimen: Treatment naive  Assessment: John Kelly is here today to initiate care with Dr Dennise for his newly diagnosed HIV infection. John Kelly is treatment naive with a positive HIV antibody. Will start patient on Biktarvy.  Counseled Biktarvy is one pill taken once daily with or without food. Counseled he may experience start up syndrome consisting of headache and fatigue the first couple weeks but should wane after. He is not taking any multivitamins. Counseled if he does start taking them (or any calcium , iron, magnesium products) to separate them from [name] by taking the Biktarvy then the multivitamin at least two hours later. Informed him of  the importance of daily adherence to maintain undetectable HIV and prevent HIV resistance. He will fill Biktarvy with [facility] and have a $0 copay. Provided him with samples for 7 days.   Plan: -Initiate Biktarvy -Follow up on HIV RNA+genotype collected today -Call with any questions or concerns  Izetta Carl, PharmD PGY1 Pharmacy Resident

## 2023-12-16 ENCOUNTER — Other Ambulatory Visit: Payer: Self-pay

## 2023-12-16 ENCOUNTER — Other Ambulatory Visit (HOSPITAL_COMMUNITY): Payer: Self-pay

## 2023-12-16 ENCOUNTER — Other Ambulatory Visit: Payer: Self-pay | Admitting: Pharmacist

## 2023-12-16 ENCOUNTER — Other Ambulatory Visit (HOSPITAL_COMMUNITY)
Admission: RE | Admit: 2023-12-16 | Discharge: 2023-12-16 | Disposition: A | Discharge status: Home / Self Care | Source: Ambulatory Visit | Attending: Internal Medicine | Admitting: Internal Medicine

## 2023-12-16 ENCOUNTER — Ambulatory Visit: Admitting: Pharmacist

## 2023-12-16 ENCOUNTER — Encounter: Payer: Self-pay | Admitting: Internal Medicine

## 2023-12-16 ENCOUNTER — Telehealth: Payer: Self-pay

## 2023-12-16 ENCOUNTER — Ambulatory Visit: Admitting: Internal Medicine

## 2023-12-16 VITALS — BP 175/95 | HR 81 | Temp 99.7°F | Wt 177.0 lb

## 2023-12-16 DIAGNOSIS — Z21 Asymptomatic human immunodeficiency virus [HIV] infection status: Secondary | ICD-10-CM

## 2023-12-16 DIAGNOSIS — A530 Latent syphilis, unspecified as early or late: Secondary | ICD-10-CM | POA: Diagnosis not present

## 2023-12-16 MED ORDER — BIKTARVY 50-200-25 MG PO TABS: 1.0000 | ORAL_TABLET | Freq: Every day | ORAL | Status: AC

## 2023-12-16 MED ORDER — BICTEGRAVIR-EMTRICITAB-TENOFOV 50-200-25 MG PO TABS: 1.0000 | ORAL_TABLET | Freq: Every day | ORAL | 5 refills | Status: DC

## 2023-12-16 MED ORDER — DOXYCYCLINE HYCLATE 100 MG PO TABS
100.0000 mg | ORAL_TABLET | Freq: Two times a day (BID) | ORAL | 0 refills | Status: AC
  Filled 2023-12-16: qty 56, 28d supply, fill #0

## 2023-12-16 MED ORDER — DOXYCYCLINE HYCLATE 100 MG PO TABS
100.0000 mg | ORAL_TABLET | Freq: Two times a day (BID) | ORAL | 0 refills | Status: DC
Start: 2023-12-16 — End: 2023-12-16

## 2023-12-16 MED ORDER — BICTEGRAVIR-EMTRICITAB-TENOFOV 50-200-25 MG PO TABS
1.0000 | ORAL_TABLET | Freq: Every day | ORAL | 5 refills | Status: DC
  Filled 2023-12-16 – 2023-12-17 (×2): qty 30, 30d supply, fill #0
  Filled 2024-01-09 – 2024-01-13 (×2): qty 30, 30d supply, fill #1
  Filled 2024-02-06: qty 30, 30d supply, fill #2

## 2023-12-16 NOTE — Progress Notes (Signed)
 [facility]     HPI: John Kelly is a 58 y.o. male presents for HIV management. Newly dx 11/28/23 as part of annual physical.  Date of diagnosis ART exposure none Past Ois no Risk factors: Heterosexual(pt states it was a one time exposure) Partners in last 2months 1, in the last 35 months2   Social: Occupation: Pharmacist, hospital Housing:house with son Support: no one knows about HIV currenly Understanding of HIV: Etoh/drug/tobacco use:  Past Medical History:  Diagnosis Date   Abnormal liver function test    Allergic rhinitis    Allergy    Anxiety    Arthritis    CAD (coronary artery disease) 09/01/2016   a.  09/01/16; NSTEMI LHC: 60-70% ostial LAD stenosis, 20% mid LAD, 30% RCA. EF 55-65%  b. 09/07/16 [place]  s/p DES to oLAD   Clotting disorder colitus   DDD (degenerative disc disease)    Depression    GERD (gastroesophageal reflux disease)    Hyperlipidemia    Hypertension    no meds   Insomnia    MS (multiple sclerosis)    Myocardial infarction (HCC)    2018   Neuromuscular disorder (HCC)    MS   Ulcerative colitis    left sided    Past Surgical History:  Procedure Laterality Date   COLONOSCOPY     CORONARY ANGIOGRAPHY N/A 09/07/2016   Procedure: Coronary Angiography;  Surgeon: Verlin Lonni BIRCH, MD;  Location: [facility] INVASIVE CV LAB;  Service: Cardiovascular;  Laterality: N/A;   CORONARY STENT INTERVENTION N/A 09/07/2016   Procedure: Coronary Stent Intervention;  Surgeon: Verlin Lonni BIRCH, MD;  Location: [facility] INVASIVE CV LAB;  Service: Cardiovascular;  Laterality: N/A;   HERNIA REPAIR     Inguinal Herniorrhapy     Right and left   LEFT HEART CATH AND CORONARY ANGIOGRAPHY N/A 09/01/2016   Procedure: Left Heart Cath and Coronary Angiography;  Surgeon: Burnard Debby LABOR, MD;  Location: [facility] INVASIVE CV LAB;  Service: Cardiovascular;  Laterality: N/A;   LEFT HEART CATH AND CORONARY ANGIOGRAPHY N/A 09/07/2016   Procedure: Left  Heart Cath and Coronary Angiography;  Surgeon: Verlin Lonni BIRCH, MD;  Location: [facility] INVASIVE CV LAB;  Service: Cardiovascular;  Laterality: N/A;   PYLOROPLASTY      Family History  Problem Relation Age of Onset   Coronary artery disease Mother    Heart attack Mother    Hyperlipidemia Mother    Hypertension Mother    Colitis Mother    Cancer Mother    Heart disease Mother    Diabetes Father    Hyperlipidemia Father    Hypertension Father    Coronary artery disease Father    Heart attack Father    Heart disease Father    Coronary artery disease Brother    Heart attack Brother 71       4 stents, smoker   Diabetes Brother    Hypertension Brother    Heart disease Brother    Hypertension Sister    Transient ischemic attack Sister    Depression Sister    Stroke Sister    Colon cancer Neg Hx    Esophageal cancer Neg Hx    Stomach cancer Neg Hx    Rectal cancer Neg Hx    Current Outpatient Medications on File Prior to Visit  Medication Sig Dispense Refill   acetaminophen  (TYLENOL ) 500 MG tablet Take 1,000 mg by mouth daily as needed for moderate pain or headache.  Ascorbic Acid (VITAMIN C) 1000 MG tablet Take 1,000 mg by mouth daily.     atorvastatin  (LIPITOR ) 40 MG tablet Take 1 tablet (40 mg total) by mouth daily. 90 tablet 3   baclofen  (LIORESAL ) 10 MG tablet Take 1 tablet (10 mg total) by mouth 2 (two) times daily as needed for muscle spasms. 30 each 0   balsalazide (COLAZAL ) 750 MG capsule Take 3 capsules (2,250 mg total) by mouth 3 (three) times daily. 270 capsule 11   Cholecalciferol  (VITAMIN D3) 5000 units CAPS Take 5,000 Units by mouth daily.     esomeprazole (NEXIUM) 20 MG packet Take 20 mg by mouth daily.     losartan  (COZAAR ) 100 MG tablet Take 1 tablet by mouth once daily 90 tablet 3   Misc Natural Products (OSTEO BI-FLEX TRIPLE STRENGTH PO) Take 2 tablets by mouth daily.     nitroGLYCERIN  (NITROSTAT ) 0.4 MG SL tablet Place 1 tablet (0.4 mg total) under the  tongue every 5 (five) minutes x 3 doses as needed for chest pain. 25 tablet 6   Nutritional Supplements (NUTRITIONAL SUPPLEMENT PO) Take by mouth. Test Max Boost- 1 tablet by mouth three times a day     doxycycline  (VIBRA -TABS) 100 MG tablet Take 1 tablet (100 mg total) by mouth 2 (two) times daily. 20 tablet 0   guaiFENesin -codeine  100-10 MG/5ML syrup Take 5 mLs by mouth every 6 (six) hours as needed for cough. 100 mL 0   No current facility-administered medications on file prior to visit.    Allergies  Allergen Reactions   Diclofenac Sodium Shortness Of Breath and Swelling    respiratory difficulty   Skelaxin [Metaxalone] Nausea And Vomiting   Trazodone  And Nefazodone Other (See Comments)     Head felt funny       Lab Results No results found for: HIV1RNAQUANT, HIV1RNAVL, CD4TABS No results found for: HIV1GENOSEQ Lab Results  Component Value Date   WBC 5.9 09/06/2022   HGB 13.7 09/06/2022   HCT 41.1 09/06/2022   MCV 93.4 09/06/2022   PLT 173 09/06/2022    Lab Results  Component Value Date   CREATININE 1.19 11/14/2023   BUN 12 11/14/2023   NA 141 11/14/2023   K 4.2 11/14/2023   CL 105 11/14/2023   CO2 27 11/14/2023   Lab Results  Component Value Date   ALT 17 11/14/2023   AST 19 11/14/2023   ALKPHOS 60 11/14/2023   BILITOT 0.7 11/14/2023    Lab Results  Component Value Date   CHOL 148 11/14/2023   TRIG 129.0 11/14/2023   HDL 33.00 (L) 11/14/2023   LDLCALC 90 11/14/2023   No results found for: HAV Lab Results  Component Value Date   HEPBSAG NON-REACTIVE 11/28/2023   HEPBSAB REACTIVE (A) 03/17/2020   No results found for: HCVAB Lab Results  Component Value Date   CHLAMYDIAWP Negative 09/01/2022   N Negative 09/01/2022   No results found for: GCPROBEAPT No results found for: QUANTGOLD  Assessment/Plan #HIV-nely diagnosed -labs today -start biktarvy -Follow up in 2weeks -met with pharmcy -Dental paperwork  #Latent syphillis Rpr  1:1 on 11/28/23, nr  in 2022 - he took doxy 10 days, last dose friday -Doxy x 28 day  #Vaccination COVID Flu- declined Monkeypox PCV Meningitis HepA serology HEpB immune 2021 Tdap 2016 Shingles  #Health maintenance -Quantiferon  -HCV nr 11/28/23 -GC -Lipid onstatin -Colonoscopy: has UC not on meds.as tcolnoscopy 5 year ago    Loney Stank, MD [facility]  I have personally spent 62 minutes involved in face-to-face and non-face-to-face activities for this patient on the day of the visit. Professional time spent includes the following activities: Preparing to see the patient (review of tests), Obtaining and/or reviewing separately obtained history (admission/discharge record), Performing a medically appropriate examination and/or evaluation , Ordering medications/tests/procedures, referring and communicating with other health care professionals, Documenting clinical information in the EMR, Independently interpreting results (not separately reported), Communicating results to the patient/family/caregiver, Counseling and educating the patient/family/caregiver and Care coordination (not separately reported).

## 2023-12-16 NOTE — Progress Notes (Signed)
 Medication Samples have been provided to the patient.  Drug name: Biktarvy        Strength: 50/200/25 mg       Qty: 7 tablets (1 bottle)   LOT: [id number]   Exp.Date: 08/2025  Samples requested by Dr. Dennise.  Dosing instructions: Take one tablet by mouth once daily  The patient has been instructed regarding the correct time, dose, and frequency of taking this medication, including desired effects and most common side effects.   Johnesha Acheampong L. Nekesha Font, PharmD, BCIDP, AAHIVP, CPP Clinical Pharmacist Practitioner Infectious Diseases Clinical Pharmacist [facility]

## 2023-12-16 NOTE — Telephone Encounter (Signed)
 [facility] Patient Advocate Encounter   Was successful in obtaining a [employer] copay card for [facility].  This copay card will make the patients copay $0.  I have spoken with the patient.    The billing information is as follows and has been shared with [facility].  RxBin: [id number] PCN: [id number] Member ID: [id number] Group ID: [id number]    Charmaine Sharps, CPhT Specialty Pharmacy Patient [facility] Phone: [phone] Fax:  [phone]

## 2023-12-17 ENCOUNTER — Other Ambulatory Visit: Payer: Self-pay

## 2023-12-17 ENCOUNTER — Other Ambulatory Visit (HOSPITAL_COMMUNITY): Payer: Self-pay

## 2023-12-17 ENCOUNTER — Other Ambulatory Visit: Payer: Self-pay | Admitting: Pharmacist

## 2023-12-17 LAB — CYTOLOGY, (ORAL, ANAL, URETHRAL) ANCILLARY ONLY
Chlamydia: NEGATIVE
Comment: NEGATIVE
Comment: NORMAL
Neisseria Gonorrhea: NEGATIVE

## 2023-12-17 LAB — URINE CYTOLOGY ANCILLARY ONLY
Chlamydia: NEGATIVE
Comment: NEGATIVE
Comment: NORMAL
Neisseria Gonorrhea: NEGATIVE

## 2023-12-17 NOTE — Progress Notes (Signed)
 Patient requested to hold off on dental application. Wants to confirm insurance will cover services with insurance. Confirmed with dental scheduler that they do accept [facility].  Will have patient complete application at next appt.

## 2023-12-17 NOTE — Progress Notes (Signed)
 Specialty Pharmacy Initiation Note   John Kelly is a 58 y.o. male who will be followed by the specialty pharmacy service for RxSp HIV    Review of administration, indication, effectiveness, safety, potential side effects, storage/disposable, and missed dose instructions occurred today for patient's specialty medication(s) Bictegravir-Emtricitab-Tenofov [facility])     Patient/Caregiver did not have any additional questions or concerns.   Patient's therapy is appropriate to: Initiate    Goals Addressed             This Visit's Progress    Achieve Undetectable HIV Viral Load < 20       Patient is initiating therapy. Patient will be evaluated at upcoming provider appointment to assess progress      Comply with lab assessments       Patient is on track. Patient will adhere to provider and/or lab appointments      Increase CD4 count until steady state       Patient is not on track and improving. Patient will be evaluated at upcoming provider appointment to assess progress         Alan JINNY Geralds Specialty Pharmacist

## 2023-12-24 LAB — COMPLETE METABOLIC PANEL WITHOUT GFR
AG Ratio: 1.6 (calc) (ref 1.0–2.5)
ALT: 26 U/L (ref 9–46)
AST: 25 U/L (ref 10–35)
Albumin: 4.5 g/dL (ref 3.6–5.1)
Alkaline phosphatase (APISO): 70 U/L (ref 35–144)
BUN: 11 mg/dL (ref 7–25)
CO2: 31 mmol/L (ref 20–32)
Calcium: 9 mg/dL (ref 8.6–10.3)
Chloride: 103 mmol/L (ref 98–110)
Creat: 1.06 mg/dL (ref 0.70–1.30)
Globulin: 2.8 g/dL (ref 1.9–3.7)
Glucose, Bld: 96 mg/dL (ref 65–99)
Potassium: 4.2 mmol/L (ref 3.5–5.3)
Sodium: 139 mmol/L (ref 135–146)
Total Bilirubin: 0.4 mg/dL (ref 0.2–1.2)
Total Protein: 7.3 g/dL (ref 6.1–8.1)

## 2023-12-24 LAB — LIPID PANEL
Cholesterol: 143 mg/dL (ref <200)
HDL: 38 mg/dL — ABNORMAL LOW (ref >=40)
LDL Cholesterol (Calc): 84 mg/dL
Non-HDL Cholesterol (Calc): 105 mg/dL (ref <130)
Total CHOL/HDL Ratio: 3.8 (calc) (ref <5.0)
Triglycerides: 118 mg/dL (ref <150)

## 2023-12-24 LAB — HEPATITIS C ANTIBODY: Hepatitis C Ab: NONREACTIVE

## 2023-12-24 LAB — CBC WITH DIFFERENTIAL/PLATELET
Absolute Lymphocytes: 1400 {cells}/uL (ref 850–3900)
Absolute Monocytes: 350 {cells}/uL (ref 200–950)
Basophils Absolute: 20 {cells}/uL (ref 0–200)
Basophils Relative: 0.4 %
Eosinophils Absolute: 50 {cells}/uL (ref 15–500)
Eosinophils Relative: 1 %
HCT: 42.6 % (ref 38.5–50.0)
Hemoglobin: 13.9 g/dL (ref 13.2–17.1)
MCH: 31 pg (ref 27.0–33.0)
MCHC: 32.6 g/dL (ref 32.0–36.0)
MCV: 95.1 fL (ref 80.0–100.0)
MPV: 11.2 fL (ref 7.5–12.5)
Monocytes Relative: 7 %
Neutro Abs: 3180 {cells}/uL (ref 1500–7800)
Neutrophils Relative %: 63.6 %
Platelets: 168 Thousand/uL (ref 140–400)
RBC: 4.48 Million/uL (ref 4.20–5.80)
RDW: 11.7 % (ref 11.0–15.0)
Total Lymphocyte: 28 %
WBC: 5 Thousand/uL (ref 3.8–10.8)

## 2023-12-24 LAB — QUANTIFERON-TB GOLD PLUS
Mitogen-NIL: 8.76 [IU]/mL
NIL: 0.02 [IU]/mL
QuantiFERON-TB Gold Plus: NEGATIVE
TB1-NIL: 0 [IU]/mL
TB2-NIL: 0 [IU]/mL

## 2023-12-24 LAB — HIV-1 RNA ULTRAQUANT REFLEX TO GENTYP+
HIV 1 RNA Quant: 40100 {copies}/mL — ABNORMAL HIGH
HIV-1 RNA Quant, Log: 4.6 {Log_copies}/mL — ABNORMAL HIGH

## 2023-12-24 LAB — HIV-1/2 AB - DIFFERENTIATION
HIV-1 antibody: POSITIVE — AB
HIV-2 Ab: NEGATIVE

## 2023-12-24 LAB — HEPATITIS A ANTIBODY, TOTAL: Hepatitis A AB,Total: NONREACTIVE

## 2023-12-24 LAB — HLA B*5701: HLA-B*5701 w/rflx HLA-B High: NEGATIVE

## 2023-12-24 LAB — HIV ANTIBODY (ROUTINE TESTING W REFLEX)
HIV 1&2 Ab, 4th Generation: REACTIVE
HIV FINAL INTERPRETATION: POSITIVE — AB

## 2023-12-24 LAB — HIV-1 GENOTYPE: HIV-1 Genotype: DETECTED — AB

## 2024-01-06 ENCOUNTER — Other Ambulatory Visit: Payer: Self-pay

## 2024-01-06 ENCOUNTER — Encounter: Payer: Self-pay | Admitting: Internal Medicine

## 2024-01-06 ENCOUNTER — Ambulatory Visit: Admitting: Internal Medicine

## 2024-01-06 VITALS — BP 186/94 | HR 81 | Ht 67.0 in | Wt 179.0 lb

## 2024-01-06 DIAGNOSIS — Z23 Encounter for immunization: Secondary | ICD-10-CM

## 2024-01-06 DIAGNOSIS — Z79899 Other long term (current) drug therapy: Secondary | ICD-10-CM | POA: Diagnosis not present

## 2024-01-06 DIAGNOSIS — Z21 Asymptomatic human immunodeficiency virus [HIV] infection status: Secondary | ICD-10-CM

## 2024-01-06 NOTE — Progress Notes (Signed)
 [facility]     HPI: John Kelly is a 58 y.o. male presents for HIV management. presents for HIV management. Newly dx 11/28/23 as part of annual physical. Start on biktarvy. No missed doses of biktarvy. Taking doxy Date of diagnosis 9/25 ART exposure none Past Ois no Risk factors: Heterosexual(pt states it was a one time exposure) Partners in last 2months 1, in the last 70 months2     Social: Occupation: pharmacist, hospital Housing:house with son Support: no one knows about HIV currenly Understanding of HIV: Etoh/drug/tobacco use: Past Medical History:  Diagnosis Date   Abnormal liver function test    Allergic rhinitis    Allergy    Anxiety    Arthritis    CAD (coronary artery disease) 09/01/2016   a.  09/01/16; NSTEMI LHC: 60-70% ostial LAD stenosis, 20% mid LAD, 30% RCA. EF 55-65%  b. 09/07/16 [place]  s/p DES to oLAD   Clotting disorder colitus   DDD (degenerative disc disease)    Depression    GERD (gastroesophageal reflux disease)    Hyperlipidemia    Hypertension    no meds   Insomnia    MS (multiple sclerosis)    Myocardial infarction (HCC)    2018   Neuromuscular disorder (HCC)    MS   Ulcerative colitis    left sided    Past Surgical History:  Procedure Laterality Date   COLONOSCOPY     CORONARY ANGIOGRAPHY N/A 09/07/2016   Procedure: Coronary Angiography;  Surgeon: Verlin Lonni BIRCH, MD;  Location: [facility] INVASIVE CV LAB;  Service: Cardiovascular;  Laterality: N/A;   CORONARY STENT INTERVENTION N/A 09/07/2016   Procedure: Coronary Stent Intervention;  Surgeon: Verlin Lonni BIRCH, MD;  Location: [facility] INVASIVE CV LAB;  Service: Cardiovascular;  Laterality: N/A;   HERNIA REPAIR     Inguinal Herniorrhapy     Right and left   LEFT HEART CATH AND CORONARY ANGIOGRAPHY N/A 09/01/2016   Procedure: Left Heart Cath and Coronary Angiography;  Surgeon: Burnard Debby LABOR, MD;  Location: [facility] INVASIVE CV LAB;  Service: Cardiovascular;   Laterality: N/A;   LEFT HEART CATH AND CORONARY ANGIOGRAPHY N/A 09/07/2016   Procedure: Left Heart Cath and Coronary Angiography;  Surgeon: Verlin Lonni BIRCH, MD;  Location: [facility] INVASIVE CV LAB;  Service: Cardiovascular;  Laterality: N/A;   PYLOROPLASTY      Family History  Problem Relation Age of Onset   Coronary artery disease Mother    Heart attack Mother    Hyperlipidemia Mother    Hypertension Mother    Colitis Mother    Cancer Mother    Heart disease Mother    Diabetes Father    Hyperlipidemia Father    Hypertension Father    Coronary artery disease Father    Heart attack Father    Heart disease Father    Coronary artery disease Brother    Heart attack Brother 70       4 stents, smoker   Diabetes Brother    Hypertension Brother    Heart disease Brother    Hypertension Sister    Transient ischemic attack Sister    Depression Sister    Stroke Sister    Colon cancer Neg Hx    Esophageal cancer Neg Hx    Stomach cancer Neg Hx    Rectal cancer Neg Hx    Current Outpatient Medications on File Prior to Visit  Medication Sig Dispense Refill   acetaminophen  (TYLENOL ) 500  MG tablet Take 1,000 mg by mouth daily as needed for moderate pain or headache.     Ascorbic Acid (VITAMIN C) 1000 MG tablet Take 1,000 mg by mouth daily.     atorvastatin  (LIPITOR ) 40 MG tablet Take 1 tablet (40 mg total) by mouth daily. 90 tablet 3   baclofen  (LIORESAL ) 10 MG tablet Take 1 tablet (10 mg total) by mouth 2 (two) times daily as needed for muscle spasms. 30 each 0   balsalazide (COLAZAL ) 750 MG capsule Take 3 capsules (2,250 mg total) by mouth 3 (three) times daily. 270 capsule 11   bictegravir-emtricitabine-tenofovir AF (BIKTARVY) 50-200-25 MG TABS tablet Take 1 tablet by mouth daily. Try to take at the same time each day with or without food. 30 tablet 5   Cholecalciferol  (VITAMIN D3) 5000 units CAPS Take 5,000 Units by mouth daily.     doxycycline  (VIBRA -TABS) 100 MG tablet Take 1  tablet (100 mg total) by mouth 2 (two) times daily. 20 tablet 0   doxycycline  (VIBRA -TABS) 100 MG tablet Take 1 tablet (100 mg total) by mouth 2 (two) times daily for 28 days. 56 tablet 0   esomeprazole (NEXIUM) 20 MG packet Take 20 mg by mouth daily.     guaiFENesin -codeine  100-10 MG/5ML syrup Take 5 mLs by mouth every 6 (six) hours as needed for cough. 100 mL 0   losartan  (COZAAR ) 100 MG tablet Take 1 tablet by mouth once daily 90 tablet 3   Misc Natural Products (OSTEO BI-FLEX TRIPLE STRENGTH PO) Take 2 tablets by mouth daily.     nitroGLYCERIN  (NITROSTAT ) 0.4 MG SL tablet Place 1 tablet (0.4 mg total) under the tongue every 5 (five) minutes x 3 doses as needed for chest pain. 25 tablet 6   Nutritional Supplements (NUTRITIONAL SUPPLEMENT PO) Take by mouth. Test Max Boost- 1 tablet by mouth three times a day     No current facility-administered medications on file prior to visit.    Allergies  Allergen Reactions   Diclofenac Sodium Shortness Of Breath and Swelling    respiratory difficulty   Skelaxin [Metaxalone] Nausea And Vomiting   Trazodone  And Nefazodone Other (See Comments)     Head felt funny       Lab Results HIV 1 RNA Quant (copies/mL)  Date Value  12/16/2023 40,100 (H)   No results found for: HIV1GENOSEQ Lab Results  Component Value Date   WBC 5.0 12/16/2023   HGB 13.9 12/16/2023   HCT 42.6 12/16/2023   MCV 95.1 12/16/2023   PLT 168 12/16/2023    Lab Results  Component Value Date   CREATININE 1.06 12/16/2023   BUN 11 12/16/2023   NA 139 12/16/2023   K 4.2 12/16/2023   CL 103 12/16/2023   CO2 31 12/16/2023   Lab Results  Component Value Date   ALT 26 12/16/2023   AST 25 12/16/2023   ALKPHOS 60 11/14/2023   BILITOT 0.4 12/16/2023    Lab Results  Component Value Date   CHOL 143 12/16/2023   TRIG 118 12/16/2023   HDL 38 (L) 12/16/2023   LDLCALC 84 12/16/2023   Lab Results  Component Value Date   HAV NON-REACTIVE 12/16/2023   Lab Results   Component Value Date   HEPBSAG NON-REACTIVE 11/28/2023   HEPBSAB REACTIVE (A) 03/17/2020   No results found for: HCVAB Lab Results  Component Value Date   CHLAMYDIAWP Negative 12/16/2023   N Negative 12/16/2023   No results found for: GCPROBEAPT No results found for:  QUANTGOLD  Assessment/Plan #HIV-newly diagnosed -labs today -CD4 today, VL40k, on 12/16/23 -continue biktarvy -F/U in one month   #False positve  syp hillis test -Tab +, rpr 1:1 negative in the past. Given gi issues/nausea would stop in the lsetting of confirmation test being negative   #Vaccination COVID Flu- today Monkeypox PCV Meningitis HepA serology needs HEpB immune 2021 Tdap 2016 Shingles   #Health maintenance -Quantiferon  negativ e9/29 -HCV nr 11/28/23 -GC ruine negative 9/29 -Lipid on statin -Colonoscopy: has UC not on meds.as tcolnoscopy 5 year ago      Loney Stank, MD [facility] I have personally spent 35 minutes involved in face-to-face and non-face-to-face activities for this patient on the day of the visit. Professional time spent includes the following activities: Preparing to see the patient (review of tests), Obtaining and/or reviewing separately obtained history (admission/discharge record), Performing a medically appropriate examination and/or evaluation , Ordering medications/tests/procedures, referring and communicating with other health care professionals, Documenting clinical information in the EMR, Independently interpreting results (not separately reported), Communicating results to the patient/family/caregiver, Counseling and educating the patient/family/caregiver and Care coordination (not separately reported).

## 2024-01-07 LAB — T-HELPER CELLS (CD4) COUNT (NOT AT ARMC)
CD4 % Helper T Cell: 34 % (ref 33–65)
CD4 T Cell Abs: 734 /uL (ref 400–1790)

## 2024-01-08 LAB — COMPREHENSIVE METABOLIC PANEL WITH GFR
AG Ratio: 1.7 (calc) (ref 1.0–2.5)
ALT: 63 U/L — ABNORMAL HIGH (ref 9–46)
AST: 39 U/L — ABNORMAL HIGH (ref 10–35)
Albumin: 4.7 g/dL (ref 3.6–5.1)
Alkaline phosphatase (APISO): 110 U/L (ref 35–144)
BUN: 17 mg/dL (ref 7–25)
CO2: 27 mmol/L (ref 20–32)
Calcium: 9.4 mg/dL (ref 8.6–10.3)
Chloride: 104 mmol/L (ref 98–110)
Creat: 1.1 mg/dL (ref 0.70–1.30)
Globulin: 2.7 g/dL (ref 1.9–3.7)
Glucose, Bld: 112 mg/dL — ABNORMAL HIGH (ref 65–99)
Potassium: 3.7 mmol/L (ref 3.5–5.3)
Sodium: 137 mmol/L (ref 135–146)
Total Bilirubin: 0.8 mg/dL (ref 0.2–1.2)
Total Protein: 7.4 g/dL (ref 6.1–8.1)
eGFR: 77 mL/min/1.73m2 (ref >=60)

## 2024-01-08 LAB — HIV-1 RNA QUANT-NO REFLEX-BLD
HIV 1 RNA Quant: <20 {copies}/mL — AB
HIV-1 RNA Quant, Log: <1.3 {Log_copies}/mL — AB

## 2024-01-08 LAB — CBC WITH DIFFERENTIAL/PLATELET
Absolute Lymphocytes: 2320 {cells}/uL (ref 850–3900)
Absolute Monocytes: 458 {cells}/uL (ref 200–950)
Basophils Absolute: 17 {cells}/uL (ref 0–200)
Basophils Relative: 0.3 %
Eosinophils Absolute: 122 {cells}/uL (ref 15–500)
Eosinophils Relative: 2.1 %
HCT: 43.5 % (ref 38.5–50.0)
Hemoglobin: 14.6 g/dL (ref 13.2–17.1)
MCH: 30.7 pg (ref 27.0–33.0)
MCHC: 33.6 g/dL (ref 32.0–36.0)
MCV: 91.6 fL (ref 80.0–100.0)
MPV: 11.3 fL (ref 7.5–12.5)
Monocytes Relative: 7.9 %
Neutro Abs: 2883 {cells}/uL (ref 1500–7800)
Neutrophils Relative %: 49.7 %
Platelets: 171 Thousand/uL (ref 140–400)
RBC: 4.75 Million/uL (ref 4.20–5.80)
RDW: 12.1 % (ref 11.0–15.0)
Total Lymphocyte: 40 %
WBC: 5.8 Thousand/uL (ref 3.8–10.8)

## 2024-01-09 ENCOUNTER — Other Ambulatory Visit (HOSPITAL_COMMUNITY): Payer: Self-pay

## 2024-01-11 ENCOUNTER — Encounter (INDEPENDENT_AMBULATORY_CARE_PROVIDER_SITE_OTHER): Payer: Self-pay

## 2024-01-13 ENCOUNTER — Other Ambulatory Visit: Payer: Self-pay

## 2024-01-13 ENCOUNTER — Other Ambulatory Visit (HOSPITAL_COMMUNITY): Payer: Self-pay

## 2024-01-13 NOTE — Progress Notes (Signed)
 Specialty Pharmacy Refill Coordination Note  John Kelly is a 58 y.o. male contacted today regarding refills of specialty medication(s) Bictegravir-Emtricitab-Tenofov [facility])   Patient requested Delivery   Delivery date: 01/15/24   Verified address: [address] [place]   [address]   Medication will be filled on: 01/14/24

## 2024-02-06 ENCOUNTER — Other Ambulatory Visit: Payer: Self-pay

## 2024-02-10 ENCOUNTER — Other Ambulatory Visit: Payer: Self-pay

## 2024-02-10 NOTE — Progress Notes (Signed)
 Specialty Pharmacy Refill Coordination Note  John Kelly is a 58 y.o. male contacted today regarding refills of specialty medication(s) Bictegravir-Emtricitab-Tenofov (BIKTARVY )   Patient requested Delivery   Delivery date: 02/11/24   Verified address: [address] [place]   [address]   Medication will be filled on: 02/10/24

## 2024-02-16 ENCOUNTER — Ambulatory Visit: Payer: Self-pay | Admitting: Internal Medicine

## 2024-02-18 ENCOUNTER — Encounter: Payer: Self-pay | Admitting: Internal Medicine

## 2024-02-18 ENCOUNTER — Ambulatory Visit: Admitting: Internal Medicine

## 2024-02-18 ENCOUNTER — Other Ambulatory Visit: Payer: Self-pay

## 2024-02-18 VITALS — BP 183/82 | HR 76 | Temp 99.3°F | Wt 184.0 lb

## 2024-02-18 DIAGNOSIS — Z23 Encounter for immunization: Secondary | ICD-10-CM

## 2024-02-18 DIAGNOSIS — Z21 Asymptomatic human immunodeficiency virus [HIV] infection status: Secondary | ICD-10-CM | POA: Diagnosis not present

## 2024-02-18 DIAGNOSIS — Z79899 Other long term (current) drug therapy: Secondary | ICD-10-CM

## 2024-02-18 MED ORDER — BICTEGRAVIR-EMTRICITAB-TENOFOV 50-200-25 MG PO TABS
1.0000 | ORAL_TABLET | Freq: Every day | ORAL | 5 refills | Status: AC
  Filled 2024-02-18 – 2024-03-09 (×2): qty 30, 30d supply, fill #0
  Filled 2024-04-03: qty 30, 30d supply, fill #1

## 2024-02-18 NOTE — Progress Notes (Signed)
 [facility]     HPI: John Kelly is a 58 y.o. male presents for HIV management on Biktarvy . No missed doses Past Medical History:  Diagnosis Date   Abnormal liver function test    Allergic rhinitis    Allergy    Anxiety    Arthritis    CAD (coronary artery disease) 09/01/2016   a.  09/01/16; NSTEMI LHC: 60-70% ostial LAD stenosis, 20% mid LAD, 30% RCA. EF 55-65%  b. 09/07/16 [place]  s/p DES to oLAD   Clotting disorder colitus   DDD (degenerative disc disease)    Depression    GERD (gastroesophageal reflux disease)    Hyperlipidemia    Hypertension    no meds   Insomnia    MS (multiple sclerosis)    Myocardial infarction (HCC)    2018   Neuromuscular disorder (HCC)    MS   Ulcerative colitis    left sided    Past Surgical History:  Procedure Laterality Date   COLONOSCOPY     CORONARY ANGIOGRAPHY N/A 09/07/2016   Procedure: Coronary Angiography;  Surgeon: Verlin Lonni BIRCH, MD;  Location: [facility] INVASIVE CV LAB;  Service: Cardiovascular;  Laterality: N/A;   CORONARY STENT INTERVENTION N/A 09/07/2016   Procedure: Coronary Stent Intervention;  Surgeon: Verlin Lonni BIRCH, MD;  Location: [facility] INVASIVE CV LAB;  Service: Cardiovascular;  Laterality: N/A;   HERNIA REPAIR     Inguinal Herniorrhapy     Right and left   LEFT HEART CATH AND CORONARY ANGIOGRAPHY N/A 09/01/2016   Procedure: Left Heart Cath and Coronary Angiography;  Surgeon: Burnard Debby LABOR, MD;  Location[facility] INVASIVE CV LAB;  Service: Cardiovascular;  Laterality: N/A;   LEFT HEART CATH AND CORONARY ANGIOGRAPHY N/A 09/07/2016   Procedure: Left Heart Cath and Coronary Angiography;  Surgeon: Verlin Lonni BIRCH, MD;  Location: [facility] INVASIVE CV LAB;  Service: Cardiovascular;  Laterality: N/A;   PYLOROPLASTY      Family History  Problem Relation Age of Onset   Coronary artery disease Mother    Heart attack Mother    Hyperlipidemia Mother    Hypertension Mother    Colitis Mother     Cancer Mother    Heart disease Mother    Diabetes Father    Hyperlipidemia Father    Hypertension Father    Coronary artery disease Father    Heart attack Father    Heart disease Father    Coronary artery disease Brother    Heart attack Brother 76       4 stents, smoker   Diabetes Brother    Hypertension Brother    Heart disease Brother    Hypertension Sister    Transient ischemic attack Sister    Depression Sister    Stroke Sister    Colon cancer Neg Hx    Esophageal cancer Neg Hx    Stomach cancer Neg Hx    Rectal cancer Neg Hx    Current Outpatient Medications on File Prior to Visit  Medication Sig Dispense Refill   acetaminophen  (TYLENOL ) 500 MG tablet Take 1,000 mg by mouth daily as needed for moderate pain or headache.     Ascorbic Acid (VITAMIN C) 1000 MG tablet Take 1,000 mg by mouth daily.     atorvastatin  (LIPITOR ) 40 MG tablet Take 1 tablet (40 mg total) by mouth daily. 90 tablet 3   baclofen  (LIORESAL ) 10 MG tablet Take 1 tablet (10 mg total) by mouth 2 (two)  times daily as needed for muscle spasms. 30 each 0   balsalazide (COLAZAL ) 750 MG capsule Take 3 capsules (2,250 mg total) by mouth 3 (three) times daily. 270 capsule 11   bictegravir-emtricitabine -tenofovir  AF (BIKTARVY ) 50-200-25 MG TABS tablet Take 1 tablet by mouth daily. Try to take at the same time each day with or without food. 30 tablet 5   Cholecalciferol  (VITAMIN D3) 5000 units CAPS Take 5,000 Units by mouth daily.     esomeprazole (NEXIUM) 20 MG packet Take 20 mg by mouth daily.     losartan  (COZAAR ) 100 MG tablet Take 1 tablet by mouth once daily 90 tablet 3   Misc Natural Products (OSTEO BI-FLEX TRIPLE STRENGTH PO) Take 2 tablets by mouth daily.     nitroGLYCERIN  (NITROSTAT ) 0.4 MG SL tablet Place 1 tablet (0.4 mg total) under the tongue every 5 (five) minutes x 3 doses as needed for chest pain. 25 tablet 6   Nutritional Supplements (NUTRITIONAL SUPPLEMENT PO) Take by mouth. Test Max Boost- 1  tablet by mouth three times a day     No current facility-administered medications on file prior to visit.    Allergies  Allergen Reactions   Diclofenac Sodium Shortness Of Breath and Swelling    respiratory difficulty   Skelaxin [Metaxalone] Nausea And Vomiting   Trazodone  And Nefazodone Other (See Comments)     Head felt funny       Lab Results HIV 1 RNA Quant (copies/mL)  Date Value  01/06/2024 <20 DETECTED (A)  12/16/2023 40,100 (H)   CD4 T Cell Abs (/uL)  Date Value  01/06/2024 734   No results found for: HIV1GENOSEQ Lab Results  Component Value Date   WBC 5.8 01/06/2024   HGB 14.6 01/06/2024   HCT 43.5 01/06/2024   MCV 91.6 01/06/2024   PLT 171 01/06/2024    Lab Results  Component Value Date   CREATININE 1.10 01/06/2024   BUN 17 01/06/2024   NA 137 01/06/2024   K 3.7 01/06/2024   CL 104 01/06/2024   CO2 27 01/06/2024   Lab Results  Component Value Date   ALT 63 (H) 01/06/2024   AST 39 (H) 01/06/2024   ALKPHOS 60 11/14/2023   BILITOT 0.8 01/06/2024    Lab Results  Component Value Date   CHOL 143 12/16/2023   TRIG 118 12/16/2023   HDL 38 (L) 12/16/2023   LDLCALC 84 12/16/2023   Lab Results  Component Value Date   HAV NON-REACTIVE 12/16/2023   Lab Results  Component Value Date   HEPBSAG NON-REACTIVE 11/28/2023   HEPBSAB REACTIVE (A) 03/17/2020   No results found for: HCVAB Lab Results  Component Value Date   CHLAMYDIAWP Negative 12/16/2023   N Negative 12/16/2023   No results found for: GCPROBEAPT No results found for: QUANTGOLD  Assessment/Plan #HIV-newly diagnosed -labs today -CD4 734, VLnd, on10/20/25 -continue biktarvy  -interested in injectible -F/U in 6 months  #False positve  syp hillis test -Tab negative, rpr 1:1   #Vaccination COVID Flu-01/06/24 Monkeypox PCV-declined Meningitis HepA not immune-today HEpB immune 2021 Tdap 2016 Shingles   #Health maintenance -Quantiferon  negativ e9/29 -HCV nr  11/28/23 -GC ruine negative 9/29 -Lipid on statin -Colonoscopy: has UC not on meds.as tcolnoscopy 5 year ago      Loney Stank, MD [facility] for Infectious Disease [facility] I have personally spent 35 minutes involved in face-to-face and non-face-to-face activities for this patient on the day of the visit. Professional time spent includes the  following activities: Preparing to see the patient (review of tests), Obtaining and/or reviewing separately obtained history (admission/discharge record), Performing a medically appropriate examination and/or evaluation , Ordering medications/tests/procedures, referring and communicating with other health care professionals, Documenting clinical information in the EMR, Independently interpreting results (not separately reported), Communicating results to the patient/family/caregiver, Counseling and educating the patient/family/caregiver and Care coordination (not separately reported).

## 2024-02-20 LAB — HIV-1 RNA QUANT-NO REFLEX-BLD
HIV 1 RNA Quant: NOT DETECTED {copies}/mL
HIV-1 RNA Quant, Log: NOT DETECTED {Log_copies}/mL

## 2024-02-20 LAB — CBC WITH DIFFERENTIAL/PLATELET
Absolute Lymphocytes: 2326 {cells}/uL (ref 850–3900)
Absolute Monocytes: 410 {cells}/uL (ref 200–950)
Basophils Absolute: 23 {cells}/uL (ref 0–200)
Basophils Relative: 0.4 %
Eosinophils Absolute: 131 {cells}/uL (ref 15–500)
Eosinophils Relative: 2.3 %
HCT: 42.9 % (ref 39.4–51.1)
Hemoglobin: 14.4 g/dL (ref 13.2–17.1)
MCH: 30.6 pg (ref 27.0–33.0)
MCHC: 33.6 g/dL (ref 31.6–35.4)
MCV: 91.3 fL (ref 81.4–101.7)
MPV: 10.8 fL (ref 7.5–12.5)
Monocytes Relative: 7.2 %
Neutro Abs: 2810 {cells}/uL (ref 1500–7800)
Neutrophils Relative %: 49.3 %
Platelets: 170 Thousand/uL (ref 140–400)
RBC: 4.7 Million/uL (ref 4.20–5.80)
RDW: 13.3 % (ref 11.0–15.0)
Total Lymphocyte: 40.8 %
WBC: 5.7 Thousand/uL (ref 3.8–10.8)

## 2024-02-20 LAB — COMPLETE METABOLIC PANEL WITHOUT GFR
AG Ratio: 1.9 (calc) (ref 1.0–2.5)
ALT: 37 U/L (ref 9–46)
AST: 31 U/L (ref 10–35)
Albumin: 4.4 g/dL (ref 3.6–5.1)
Alkaline phosphatase (APISO): 70 U/L (ref 35–144)
BUN: 16 mg/dL (ref 7–25)
CO2: 27 mmol/L (ref 20–32)
Calcium: 8.9 mg/dL (ref 8.6–10.3)
Chloride: 105 mmol/L (ref 98–110)
Creat: 1.1 mg/dL (ref 0.70–1.30)
Globulin: 2.3 g/dL (ref 1.9–3.7)
Glucose, Bld: 125 mg/dL — ABNORMAL HIGH (ref 65–99)
Potassium: 4 mmol/L (ref 3.5–5.3)
Sodium: 140 mmol/L (ref 135–146)
Total Bilirubin: 0.7 mg/dL (ref 0.2–1.2)
Total Protein: 6.7 g/dL (ref 6.1–8.1)

## 2024-02-21 ENCOUNTER — Ambulatory Visit: Payer: Self-pay | Admitting: Internal Medicine

## 2024-03-09 ENCOUNTER — Other Ambulatory Visit (HOSPITAL_COMMUNITY): Payer: Self-pay

## 2024-03-09 ENCOUNTER — Other Ambulatory Visit: Payer: Self-pay

## 2024-03-11 ENCOUNTER — Other Ambulatory Visit: Payer: Self-pay

## 2024-03-11 NOTE — Progress Notes (Signed)
 Specialty Pharmacy Refill Coordination Note  John Kelly is a 58 y.o. male contacted today regarding refills of specialty medication(s) Bictegravir-Emtricitab-Tenofov (BIKTARVY )   Patient requested Delivery   Delivery date: 03/16/24   Verified address: [address] [place]   [address]   Medication will be filled on: 03/13/24

## 2024-03-13 ENCOUNTER — Other Ambulatory Visit: Payer: Self-pay

## 2024-04-03 ENCOUNTER — Other Ambulatory Visit: Payer: Self-pay

## 2024-04-06 ENCOUNTER — Other Ambulatory Visit: Payer: Self-pay | Admitting: Pharmacy Technician

## 2024-04-06 ENCOUNTER — Other Ambulatory Visit: Payer: Self-pay

## 2024-04-06 NOTE — Progress Notes (Signed)
 Specialty Pharmacy Refill Coordination Note  John Kelly is a 58 y.o. male contacted today regarding refills of specialty medication(s) Bictegravir-Emtricitab-Tenofov (BIKTARVY )   Patient requested Delivery   Delivery date: 04/09/24   Verified address: [address] [place]   [address]   Medication will be filled on: 04/08/24

## 2024-04-08 ENCOUNTER — Other Ambulatory Visit: Payer: Self-pay

## 2024-04-16 ENCOUNTER — Telehealth: Payer: Self-pay | Admitting: Orthopedic Surgery

## 2024-04-16 NOTE — Telephone Encounter (Signed)
 Patient advised and follow up made for 04/22/24

## 2024-04-16 NOTE — Telephone Encounter (Signed)
 Patient is calling to see if [facility] will place a referral to [facility] for his back pain.

## 2024-04-16 NOTE — Telephone Encounter (Signed)
 If he has not been since 2004, he should get referral from PCP or see me in clinic.

## 2024-04-16 NOTE — Telephone Encounter (Signed)
 LOV 02-06-23

## 2024-04-17 NOTE — Progress Notes (Unsigned)
 "  Referring Physician:  Avelina Greig BRAVO, MD [address] [place],  [place] [address]  Primary Physician:  Avelina Greig BRAVO, MD  History of Present Illness: Mr. Nigil Braman has a history of MS, HTN, CAD, asthma, GERD, UC.   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 He also has multilevel foraminal stenosis. Last MRI was 04/17/22.   Also with known cervical spondylosis with mild central stenosis C2-C7 with multilevel foraminal stenosis. Last cervical MRI was 01/18/22.   He was last seen by me on 02/07/23 and repeat ESI was ordered. He had L4-L5 IL on 02/26/23 and another one on 09/19/23.   He called last week asking about PT and he is here for follow up.          He was not interested in lumbar surgery or PT for cervical/lumbar spine at last visit.   He is here for follow up.   He had great relief with his last ESI in July. He has intermittent LBP with intermittent bilateral leg pain posterior to his knees that is worse with standing and walking. He has some relief when he sits/stops walking.   Pain has just started returning and is not as severe as it's been in the past.   He has MS and has chronic intermittent numbness and tingling in his feet. He had shingles in his left arm a few weeks ago- blisters are gone. Still having some nerve pain.   No bowel or bladder issues.   Conservative measures:  Physical therapy: has done in past, none recent. He works out regularly.   Multimodal medical therapy including regular antiinflammatories: tylenol   Injections:  L4-L5 IL ESI on 09/19/23 L4-L5 IL ESI on 02/26/23 L4-L5 IL ESI on 09/18/22 L4-L5 IL ESI on 05/09/22 L4-L5 IL ESI on 01/19/22 L4-L5 IL ESI on 09/14/21 L4-L5 IL ESI on 04/17/21 L4-L5 IL ESI on 12/15/20 L4-L5 IL ESI on 06/30/21 L4-L5 IL ESI on 01/25/20 L4-L5 IL ESI on 10/07/19 L4-L5 IL ESI on 07/21/19  Past Surgery: no spinal surgery  Ransome Helwig has no symptoms of cervical myelopathy.  The  symptoms are causing a significant impact on the patient's life.   Review of Systems:  A 10 point review of systems is negative, except for the pertinent positives and negatives detailed in the HPI.  Past Medical History: Past Medical History:  Diagnosis Date   Abnormal liver function test    Allergic rhinitis    Allergy    Anxiety    Arthritis    CAD (coronary artery disease) 09/01/2016   a.  09/01/16; NSTEMI LHC: 60-70% ostial LAD stenosis, 20% mid LAD, 30% RCA. EF 55-65%  b. 09/07/16 [place]  s/p DES to oLAD   Clotting disorder colitus   DDD (degenerative disc disease)    Depression    GERD (gastroesophageal reflux disease)    Hyperlipidemia    Hypertension    no meds   Insomnia    MS (multiple sclerosis)    Myocardial infarction (HCC)    2018   Neuromuscular disorder (HCC)    MS   Ulcerative colitis    left sided    Past Surgical History: Past Surgical History:  Procedure Laterality Date   COLONOSCOPY     CORONARY ANGIOGRAPHY N/A 09/07/2016   Procedure: Coronary Angiography;  Surgeon: Verlin Lonni BIRCH, MD;  Location: [facility];  Service: Cardiovascular;  Laterality: N/A;   CORONARY STENT INTERVENTION N/A 09/07/2016  Procedure: Coronary Stent Intervention;  Surgeon: Verlin Lonni BIRCH, MD;  Location: [facility];  Service: Cardiovascular;  Laterality: N/A;   HERNIA REPAIR     Inguinal Herniorrhapy     Right and left   LEFT HEART CATH AND CORONARY ANGIOGRAPHY N/A 09/01/2016   Procedure: Left Heart Cath and Coronary Angiography;  Surgeon: Burnard Debby LABOR, MD;  Location: [facility] INVASIVE CV LAB;  Service: Cardiovascular;  Laterality: N/A;   LEFT HEART CATH AND CORONARY ANGIOGRAPHY N/A 09/07/2016   Procedure: Left Heart Cath and Coronary Angiography;  Surgeon: Verlin Lonni BIRCH, MD;  Location: [facility] INVASIVE CV LAB;  Service: Cardiovascular;  Laterality: N/A;   PYLOROPLASTY      Allergies: Allergies as of 04/22/2024 - Review Complete 01/06/2024   Allergen Reaction Noted   Diclofenac sodium Shortness Of Breath and Swelling    Skelaxin [metaxalone] Nausea And Vomiting 01/17/2010   Trazodone  and nefazodone Other (See Comments) 09/06/2016    Medications: Outpatient Encounter Medications as of 04/22/2024  Medication Sig   acetaminophen  (TYLENOL ) 500 MG tablet Take 1,000 mg by mouth daily as needed for moderate pain or headache.   Ascorbic Acid (VITAMIN C) 1000 MG tablet Take 1,000 mg by mouth daily.   atorvastatin  (LIPITOR ) 40 MG tablet Take 1 tablet (40 mg total) by mouth daily.   baclofen  (LIORESAL ) 10 MG tablet Take 1 tablet (10 mg total) by mouth 2 (two) times daily as needed for muscle spasms.   balsalazide (COLAZAL ) 750 MG capsule Take 3 capsules (2,250 mg total) by mouth 3 (three) times daily.   bictegravir-emtricitabine -tenofovir  AF (BIKTARVY ) 50-200-25 MG TABS tablet Take 1 tablet by mouth daily. Try to take at the same time each day with or without food.   Cholecalciferol  (VITAMIN D3) 5000 units CAPS Take 5,000 Units by mouth daily.   esomeprazole (NEXIUM) 20 MG packet Take 20 mg by mouth daily.   losartan  (COZAAR ) 100 MG tablet Take 1 tablet by mouth once daily   Misc Natural Products (OSTEO BI-FLEX TRIPLE STRENGTH PO) Take 2 tablets by mouth daily.   nitroGLYCERIN  (NITROSTAT ) 0.4 MG SL tablet Place 1 tablet (0.4 mg total) under the tongue every 5 (five) minutes x 3 doses as needed for chest pain.   Nutritional Supplements (NUTRITIONAL SUPPLEMENT PO) Take by mouth. Test Max Boost- 1 tablet by mouth three times a day   No facility-administered encounter medications on file as of 04/22/2024.    Social History: Social History   Tobacco Use   Smoking status: Never   Smokeless tobacco: Never  Vaping Use   Vaping status: Never Used  Substance Use Topics   Alcohol use: Yes    Alcohol/week: 0.0 standard drinks of alcohol    Comment: rarely   Drug use: No    Family Medical History: Family History  Problem Relation Age of  Onset   Coronary artery disease Mother    Heart attack Mother    Hyperlipidemia Mother    Hypertension Mother    Colitis Mother    Cancer Mother    Heart disease Mother    Diabetes Father    Hyperlipidemia Father    Hypertension Father    Coronary artery disease Father    Heart attack Father    Heart disease Father    Coronary artery disease Brother    Heart attack Brother 59       4 stents, smoker   Diabetes Brother    Hypertension Brother    Heart disease Brother  Hypertension Sister    Transient ischemic attack Sister    Depression Sister    Stroke Sister    Colon cancer Neg Hx    Esophageal cancer Neg Hx    Stomach cancer Neg Hx    Rectal cancer Neg Hx     Physical Examination: There were no vitals filed for this visit.  Awake, alert, oriented to person, place, and time.  Speech is clear and fluent. Fund of knowledge is appropriate.   Cranial Nerves: Pupils equal round and reactive to light.  Facial tone is symmetric.    He has no lower lumbar tenderness.   Strength:  Side Iliopsoas Quads Hamstring PF DF EHL  R 5 5 5 5 5 5   L 5 5 5 5 5 5    Reflexes are 2+ and symmetric at the patella and achilles.    Clonus is not present.   Bilateral lower extremity sensation is intact to light touch.     Gait is normal.    He has triggering of left ring finger with tenderness over A1 pulley.    Medical Decision Making  Imaging: None  Assessment and Plan: Mr. Colver had great relief with his last ESI in July. He has intermittent LBP with intermittent bilateral leg pain posterior to his knees that is worse with standing and walking.   Pain has just started returning and is not as severe as it's been in the past.   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 He also has multilevel foraminal stenosis.   He has left ring trigger finger.   Treatment options discussed with patient and following plan made:   - He is not interested in  any surgery options for his lumbar spine.  - He does not want to do any PT for his lumbar spine.  - He would like to proceed with repeat lumbar ESI with [facility] in [place].  - Refer to ortho at [facility] for left ring trigger finger.  - Will message him after the first of the year to check on him.   I spent a total of 15 minutes in face-to-face and non-face-to-face activities related to this patient's care toda including review of outside records, review of imaging, review of symptoms, physical exam, discussion of differential diagnosis, discussion of treatment options, and documentation.   Glade Boys PA-C Dept. of Neurosurgery "

## 2024-04-22 ENCOUNTER — Ambulatory Visit
Admission: RE | Admit: 2024-04-22 | Discharge: 2024-04-22 | Disposition: A | Discharge status: Home / Self Care | Source: Ambulatory Visit

## 2024-04-22 ENCOUNTER — Ambulatory Visit: Admitting: Physician Assistant

## 2024-04-22 ENCOUNTER — Ambulatory Visit: Admitting: Orthopedic Surgery

## 2024-04-22 ENCOUNTER — Encounter: Payer: Self-pay | Admitting: Physician Assistant

## 2024-04-22 VITALS — BP 134/82 | Ht 67.0 in | Wt 176.0 lb

## 2024-04-22 VITALS — BP 130/66 | HR 87 | Temp 100.1°F | Resp 20

## 2024-04-22 DIAGNOSIS — J029 Acute pharyngitis, unspecified: Secondary | ICD-10-CM

## 2024-04-22 DIAGNOSIS — M5416 Radiculopathy, lumbar region: Secondary | ICD-10-CM | POA: Diagnosis not present

## 2024-04-22 DIAGNOSIS — M48061 Spinal stenosis, lumbar region without neurogenic claudication: Secondary | ICD-10-CM

## 2024-04-22 DIAGNOSIS — B349 Viral infection, unspecified: Secondary | ICD-10-CM

## 2024-04-22 LAB — POCT RAPID STREP A (OFFICE): Rapid Strep A Screen: NEGATIVE

## 2024-04-22 MED ORDER — LIDOCAINE VISCOUS HCL 2 % MT SOLN: 15.0000 mL | OROMUCOSAL | 0 refills | Status: AC | PRN

## 2024-04-22 MED ORDER — PREDNISONE 10 MG (21) PO TBPK: ORAL_TABLET | Freq: Every day | ORAL | 0 refills | Status: AC

## 2024-04-22 NOTE — Progress Notes (Signed)
 "  Referring Physician:  Avelina Greig BRAVO, MD [address] [place],  [place] [address]  Primary Physician:  Avelina Greig BRAVO, MD  History of Present Illness: [name] has a history of MS, HTN, CAD, asthma, GERD, UC. 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 He also has multilevel foraminal stenosis. Last MRI was 04/17/22. Also with known cervical spondylosis with mild central stenosis C2-C7 with multilevel foraminal stenosis. Last cervical MRI was 01/18/22. He was last seen on 02/07/23 and repeat ESI was ordered. He had L4-L5 IL on 02/26/23 and another one on 09/19/23.   He comes today for follow-up and specifically a physical therapy referral for his low back.  He continues to have pain off-and-on in his low back which is often on his left side but occasionally on both.  He does have some intermittent radiation to his left leg in the posterior aspect with occasional numbness and tingling.  Denies any new weakness or falls.   No bowel or bladder issues.   Conservative measures:  Physical therapy: has done in past, none recent. He works out regularly.   Multimodal medical therapy including regular antiinflammatories: tylenol   Injections:  L4-L5 IL ESI on 09/19/23 L4-L5 IL ESI on 02/26/23 L4-L5 IL ESI on 09/18/22 L4-L5 IL ESI on 05/09/22 L4-L5 IL ESI on 01/19/22 L4-L5 IL ESI on 09/14/21 L4-L5 IL ESI on 04/17/21 L4-L5 IL ESI on 12/15/20 L4-L5 IL ESI on 06/30/21 L4-L5 IL ESI on 01/25/20 L4-L5 IL ESI on 10/07/19 L4-L5 IL ESI on 07/21/19  Past Surgery: no spinal surgery  John Kelly has no symptoms of cervical myelopathy.  The symptoms are causing a significant impact on the patient's life.   Review of Systems:  A 10 point review of systems is negative, except for the pertinent positives and negatives detailed in the HPI.  Past Medical History: Past Medical History:  Diagnosis Date   Abnormal liver function test    Allergic rhinitis    Allergy     Anxiety    Arthritis    CAD (coronary artery disease) 09/01/2016   a.  09/01/16; NSTEMI LHC: 60-70% ostial LAD stenosis, 20% mid LAD, 30% RCA. EF 55-65%  b. 09/07/16 [place]  s/p DES to oLAD   Clotting disorder colitus   DDD (degenerative disc disease)    Depression    GERD (gastroesophageal reflux disease)    Hyperlipidemia    Hypertension    no meds   Insomnia    MS (multiple sclerosis)    Myocardial infarction (HCC)    2018   Neuromuscular disorder (HCC)    MS   Ulcerative colitis    left sided    Past Surgical History: Past Surgical History:  Procedure Laterality Date   COLONOSCOPY     CORONARY ANGIOGRAPHY N/A 09/07/2016   Procedure: Coronary Angiography;  Surgeon: Verlin Lonni BIRCH, MD;  Location: [facility] INVASIVE CV LAB;  Service: Cardiovascular;  Laterality: N/A;   CORONARY STENT INTERVENTION N/A 09/07/2016   Procedure: Coronary Stent Intervention;  Surgeon: Verlin Lonni BIRCH, MD;  Location: [facility] INVAS[facility];  Service: Cardiovascular;  Laterality: N/A;   HERNIA REPAIR     Inguinal Herniorrhapy     Right and left   LEFT HEART CATH AND CORONARY ANGIOGRAPHY N/A 09/01/2016   Procedure: Left Heart Cath and Coronary Angiography;  Surgeon: Burnard Debby LABOR, MD;  Location: [facility] INVASIVE CV LAB;  Service: Cardiovascular;  Laterality: N/A;   LEFT HEART CATH AND  CORONARY ANGIOGRAPHY N/A 09/07/2016   Procedure: Left Heart Cath and Coronary Angiography;  Surgeon: Verlin Lonni BIRCH, MD;  Location: [facility] INVASIVE CV LAB;  Service: Cardiovascular;  Laterality: N/A;   PYLOROPLASTY      Allergies: Allergies as of 04/22/2024 - Review Complete 04/22/2024  Allergen Reaction Noted   Diclofenac sodium Shortness Of Breath and Swelling    Skelaxin [metaxalone] Nausea And Vomiting 01/17/2010   Trazodone  and nefazodone Other (See Comments) 09/06/2016    Medications: Outpatient Encounter Medications as of 04/22/2024  Medication Sig   acetaminophen  (TYLENOL ) 500 MG tablet Take 1,000 mg by  mouth daily as needed for moderate pain or headache.   Ascorbic Acid (VITAMIN C) 1000 MG tablet Take 1,000 mg by mouth daily.   atorvastatin  (LIPITOR ) 40 MG tablet Take 1 tablet (40 mg total) by mouth daily.   baclofen  (LIORESAL ) 10 MG tablet Take 1 tablet (10 mg total) by mouth 2 (two) times daily as needed for muscle spasms.   balsalazide (COLAZAL ) 750 MG capsule Take 3 capsules (2,250 mg total) by mouth 3 (three) times daily.   bictegravir-emtricitabine -tenofovir  AF (BIKTARVY ) 50-200-25 MG TABS tablet Take 1 tablet by mouth daily. Try to take at the same time each day with or without food.   Cholecalciferol  (VITAMIN D3) 5000 units CAPS Take 5,000 Units by mouth daily.   esomeprazole (NEXIUM) 20 MG packet Take 20 mg by mouth daily.   losartan  (COZAAR ) 100 MG tablet Take 1 tablet by mouth once daily   Misc Natural Products (OSTEO BI-FLEX TRIPLE STRENGTH PO) Take 2 tablets by mouth daily.   nitroGLYCERIN  (NITROSTAT ) 0.4 MG SL tablet Place 1 tablet (0.4 mg total) under the tongue every 5 (five) minutes x 3 doses as needed for chest pain.   Nutritional Supplements (NUTRITIONAL SUPPLEMENT PO) Take by mouth. Test Max Boost- 1 tablet by mouth three times a day   No facility-administered encounter medications on file as of 04/22/2024.    Social History: Social History   Tobacco Use   Smoking status: Never   Smokeless tobacco: Never  Vaping Use   Vaping status: Never Used  Substance Use Topics   Alcohol use: Yes    Alcohol/week: 0.0 standard drinks of alcohol    Comment: rarely   Drug use: No    Family Medical History: Family History  Problem Relation Age of Onset   Coronary artery disease Mother    Heart attack Mother    Hyperlipidemia Mother    Hypertension Mother    Colitis Mother    Cancer Mother    Heart disease Mother    Diabetes Father    Hyperlipidemia Father    Hypertension Father    Coronary artery disease Father    Heart attack Father    Heart disease Father     Coronary artery disease Brother    Heart attack Brother 17       4 stents, smoker   Diabetes Brother    Hypertension Brother    Heart disease Brother    Hypertension Sister    Transient ischemic attack Sister    Depression Sister    Stroke Sister    Colon cancer Neg Hx    Esophageal cancer Neg Hx    Stomach cancer Neg Hx    Rectal cancer Neg Hx     Physical Examination: Vitals:   04/22/24 0934  BP: 134/82    Awake, alert, oriented to person, place, and time.  Speech is clear and fluent. Fund of knowledge  is appropriate.   Cranial Nerves: Pupils equal round and reactive to light.  Facial tone is symmetric.    He has minimal lumbar tenderness.   Strength:  Side Iliopsoas Quads Hamstring PF DF EHL  R 5 5 5 5 5 5   L 5 5 5 5 5 5     Gait is normal.      Medical Decision Making  Imaging: No new interval imaging  Assessment and Plan: Mr. Hagood had great relief with his last ESI, but is not able to move forward with any additional injections until he does additional physical therapy. I am amenable to this and I am happy to send a referral for him. He continues to have low back pain that intermittently radiates to his left leg.    No other changes at this time.  Happy for him to follow-up with us  on an as needed basis if he were to want any additional intervention in the future.    John Barbone PA-C Dept. of Neurosurgery "

## 2024-04-22 NOTE — ED Triage Notes (Signed)
 Patient reports sore throat x 3 days. Patient has taken Tylenol  with mild relief. Rates pain 6/10.

## 2024-04-22 NOTE — ED Provider Notes (Signed)
 " John Kelly    CSN: [id number] Arrival date & time: 04/22/24  1511      History   Chief Complaint Chief Complaint  Patient presents with   Sore Throat    Entered by patient   Fever    HPI John Kelly is a 59 y.o. male.   Patient presents for evaluation of mild nasal congestion, and frequent coughing, sore throat and headache present for 3 days.  Painful to swallow therefore minimal intake today.  Unsure presence of sick contacts.  Has attempted use of Tylenol  and salt water gargles.       Past Medical History:  Diagnosis Date   Abnormal liver function test    Allergic rhinitis    Allergy    Anxiety    Arthritis    CAD (coronary artery disease) 09/01/2016   a.  09/01/16; NSTEMI LHC: 60-70% ostial LAD stenosis, 20% mid LAD, 30% RCA. EF 55-65%  b. 09/07/16 [place]  s/p DES to oLAD   Clotting disorder colitus   DDD (degenerative disc disease)    Depression    GERD (gastroesophageal reflux disease)    Hyperlipidemia    Hypertension    no meds   Insomnia    MS (multiple sclerosis)    Myocardial infarction (HCC)    2018   Neuromuscular disorder (HCC)    MS   Ulcerative colitis    left sided    Patient Active Problem List   Diagnosis Date Noted   Screening examination for STI 11/28/2023   Cervical spinal stenosis 06/04/2019   High cholesterol 09/06/2016   CAD (coronary artery disease) 09/01/2016   Umbilical hernia 04/13/2016   Multiple sclerosis 12/15/2014   DDD (degenerative disc disease), lumbar 09/22/2013   Chronic insomnia 09/22/2013   Family history of early CAD 09/22/2013   Depression, major, single episode, 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   Mild intermittent asthma 01/07/2009   Essential hypertension, benign 04/14/2007   Allergic rhinitis 04/14/2007   GERD 04/14/2007    Past Surgical History:  Procedure Laterality Date   COLONOSCOPY     CORONARY ANGIOGRAPHY N/A 09/07/2016    Procedure: Coronary Angiography;  Surgeon: Verlin Lonni BIRCH, MD;  Location: [facility] INVASIVE CV LAB;  Service: Cardiovascular;  Laterality: N/A;   CORONARY STENT INTERVENTION N/A 09/07/2016   Procedure: Coronary Stent Intervention;  Surgeon: Verlin Lonni BIRCH, MD;  Location: [facility] CV [facility];  Service: Cardiovascular;  Laterality: N/A;   HERNIA REPAIR     Inguinal Herniorrhapy     Right and left   LEFT HEART CATH AND CORONARY ANGIOGRAPHY N/A 09/01/2016   Procedure: Left Heart Cath and Coronary Angiography;  Surgeon: Burnard Debby LABOR, MD;  Location: [facility] CV LAB;  Service: Cardiovascular;  Laterality: N/A;   LEFT HEART CATH AND CORONARY ANGIOGRAPHY N/A 09/07/2016   Procedure: Left Heart Cath and Coronary Angiography;  Surgeon: Verlin Lonni BIRCH, MD;  Location: [facility] INVASIVE CV LAB;  Service: Cardiovascular;  Laterality: N/A;   PYLOROPLASTY         Home Medications    Prior to Admission medications  Medication Sig Start Date End Date Taking? Authorizing Provider  acetaminophen  (TYLENOL ) 500 MG tablet Take 1,000 mg by mouth daily as needed for moderate pain or headache.    [provider]  Ascorbic Acid (VITAMIN C) 1000 MG tablet Take 1,000 mg by mouth daily.    [provider]  atorvastatin  (LIPITOR ) 40 MG tablet Take 1 tablet (40  mg total) by mouth daily. 11/28/23   Bedsole, Amy E, MD  baclofen  (LIORESAL ) 10 MG tablet Take 1 tablet (10 mg total) by mouth 2 (two) times daily as needed for muscle spasms. 11/28/23   Bedsole, Amy E, MD  balsalazide (COLAZAL ) 750 MG capsule Take 3 capsules (2,250 mg total) by mouth 3 (three) times daily. 07/16/19   Aneita Gwendlyn DASEN, MD  bictegravir-emtricitabine -tenofovir  AF (BIKTARVY ) 50-200-25 MG TABS tablet Take 1 tablet by mouth daily. Try to take at the same time each day with or without food. 02/18/24   Dennise Kingsley, MD  Cholecalciferol  (VITAMIN D3) 5000 units CAPS Take 5,000 Units by mouth daily.    [provider]  esomeprazole (NEXIUM) 20 MG packet Take 20 mg by mouth daily.    [provider]  losartan  (COZAAR ) 100 MG tablet Take 1 tablet by mouth once daily 12/11/23   Bedsole, Amy E, MD  Misc Natural Products (OSTEO BI-FLEX TRIPLE STRENGTH PO) Take 2 tablets by mouth daily.    [provider]  nitroGLYCERIN  (NITROSTAT ) 0.4 MG SL tablet Place 1 tablet (0.4 mg total) under the tongue every 5 (five) minutes x 3 doses as needed for chest pain. 08/30/21   Emelia Josefa HERO, NP  Nutritional Supplements (NUTRITIONAL SUPPLEMENT PO) Take by mouth. Test Max Boost- 1 tablet by mouth three times a day    [provider]    Family History Family History  Problem Relation Age of Onset   Coronary artery disease Mother    Heart attack Mother    Hyperlipidemia Mother    Hypertension Mother    Colitis Mother    Cancer Mother    Heart disease Mother    Diabetes Father    Hyperlipidemia Father    Hypertension Father    Coronary artery disease Father    Heart attack Father    Heart disease Father    Coronary artery disease Brother    Heart attack Brother 35       4 stents, smoker   Diabetes Brother    Hypertension Brother    Heart disease Brother    Hypertension Sister    Transient ischemic attack Sister    Depression Sister    Stroke Sister    Colon cancer Neg Hx    Esophageal cancer Neg Hx    Stomach cancer Neg Hx    Rectal cancer Neg Hx     Social History Social History[1]   Allergies   Diclofenac sodium, Skelaxin [metaxalone], and Trazodone  and nefazodone   Review of Systems Review of Systems   Physical Exam Triage Vital Signs ED Triage Vitals  Encounter Vitals Group     BP 04/22/24 1542 130/66     Girls Systolic BP Percentile --      Girls Diastolic BP Percentile --      Boys Systolic BP Percentile --      Boys Diastolic BP Percentile --      Pulse Rate 04/22/24 1542 87     Resp 04/22/24 1542 20     Temp 04/22/24 1542 100.1 F (37.8 C)     Temp  Source 04/22/24 1542 Oral     SpO2 04/22/24 1542 93 %     Weight --      Height --      Head Circumference --      Peak Flow --      Pain Score 04/22/24 1541 6     Pain Loc --  Pain Education --      Exclude from Growth Chart --    No data found.  Updated Vital Signs BP 130/66 (BP Location: Right Arm)   Pulse 87   Temp 100.1 F (37.8 C) (Oral)   Resp 20   SpO2 93%   Visual Acuity Right Eye Distance:   Left Eye Distance:   Bilateral Distance:    Right Eye Near:   Left Eye Near:    Bilateral Near:     Physical Exam Constitutional:      Appearance: Normal appearance.  HENT:     Head: Normocephalic.     Right Ear: Tympanic membrane, ear canal and external ear normal.     Left Ear: Tympanic membrane, ear canal and external ear normal.     Nose: Congestion present.     Mouth/Throat:     Pharynx: No oropharyngeal exudate or posterior oropharyngeal erythema.  Eyes:     Extraocular Movements: Extraocular movements intact.  Cardiovascular:     Rate and Rhythm: Normal rate and regular rhythm.     Pulses: Normal pulses.     Heart sounds: Normal heart sounds.  Pulmonary:     Effort: Pulmonary effort is normal.     Breath sounds: Normal breath sounds.  Lymphadenopathy:     Cervical: Cervical adenopathy present.  Neurological:     Mental Status: He is alert and oriented to person, place, and time. Mental status is at baseline.      UC Treatments / Results  Labs (all labs ordered are listed, but only abnormal results are displayed) Labs Reviewed  POCT RAPID STREP A (OFFICE) - Normal    EKG   Radiology No results found.  Procedures Procedures (including critical care time)  Medications Ordered in UC Medications - No data to display  Initial Impression / Assessment and Plan / UC Course  I have reviewed the triage vital signs and the nursing notes.  Pertinent labs & imaging results that were available during my care of the patient were reviewed by me  and considered in my medical decision making (see chart for details).  Viral illness, sore throat  Patient is in no signs of distress nor toxic appearing.  Vital signs are stable.  Low suspicion for pneumonia, pneumothorax or bronchitis and therefore will defer imaging.  Rapid strep test negative, no abnormality to the oropharynx, discussed findings with patient, cervical adenopathy present on exam most likely cause of sore throat related to nasal congestion, discussed.  Prescribed prednisone  and viscous lidocaine .May use additional over-the-counter medications as needed for supportive care.  May follow-up with urgent care as needed if symptoms persist or worsen.  Note given.    Final Clinical Impressions(s) / UC Diagnoses   Final diagnoses:  Sore throat   Discharge Instructions   None    ED Prescriptions   None    PDMP not reviewed this encounter.     [1]  Social History Tobacco Use   Smoking status: Never   Smokeless tobacco: Never  Vaping Use   Vaping status: Never Used  Substance Use Topics   Alcohol use: Yes    Alcohol/week: 0.0 standard drinks of alcohol    Comment: rarely   Drug use: No     Teresa Shelba SAUNDERS, NP 04/22/24 1644  "

## 2024-04-22 NOTE — Discharge Instructions (Signed)
 Your symptoms today are most likely being caused by a virus and should steadily improve in time it can take up to 7 to 10 days before you truly start to see a turnaround however things will get better  Rapid strep test is negative for bacteria to the throat  Begin prednisone  every morning to help reduce throat pain, take as directed, may use Tylenol  in addition  May gargle and spit lidocaine  solution for temporary relief to your throat    You can take Tylenol   as needed for fever reduction and pain relief.   For cough: honey 1/2 to 1 teaspoon (you can dilute the honey in water or another fluid).  You can also use guaifenesin  and dextromethorphan for cough. You can use a humidifier for chest congestion and cough.  If you don't have a humidifier, you can sit in the bathroom with the hot shower running.      For sore throat: try warm salt water gargles, cepacol lozenges, throat spray, warm tea or water with lemon/honey, popsicles or ice, or OTC cold relief medicine for throat discomfort.   For congestion: take a daily anti-histamine like Zyrtec, Claritin, and a oral decongestant, such as pseudoephedrine.  You can also use Flonase  1-2 sprays in each nostril daily.   It is important to stay hydrated: drink plenty of fluids (water, gatorade/powerade/pedialyte, juices, or teas) to keep your throat moisturized and help further relieve irritation/discomfort.

## 2024-08-18 ENCOUNTER — Ambulatory Visit: Admitting: Internal Medicine
